# Patient Record
Sex: Male | Born: 1955
Health system: Southern US, Community
[De-identification: ages and names within clinical notes are randomized; demographics above are authoritative.]

## PROBLEM LIST (undated history)

## (undated) DIAGNOSIS — Z8669 Personal history of other diseases of the nervous system and sense organs: Secondary | ICD-10-CM

## (undated) DIAGNOSIS — K900 Celiac disease: Secondary | ICD-10-CM

## (undated) DIAGNOSIS — N182 Chronic kidney disease, stage 2 (mild): Secondary | ICD-10-CM

## (undated) DIAGNOSIS — F5104 Psychophysiologic insomnia: Secondary | ICD-10-CM

## (undated) DIAGNOSIS — I10 Essential (primary) hypertension: Secondary | ICD-10-CM

## (undated) DIAGNOSIS — N529 Male erectile dysfunction, unspecified: Secondary | ICD-10-CM

## (undated) DIAGNOSIS — I480 Paroxysmal atrial fibrillation: Secondary | ICD-10-CM

## (undated) DIAGNOSIS — K219 Gastro-esophageal reflux disease without esophagitis: Secondary | ICD-10-CM

## (undated) DIAGNOSIS — G4733 Obstructive sleep apnea (adult) (pediatric): Secondary | ICD-10-CM

## (undated) DIAGNOSIS — I4892 Unspecified atrial flutter: Secondary | ICD-10-CM

## (undated) DIAGNOSIS — J310 Chronic rhinitis: Secondary | ICD-10-CM

## (undated) DIAGNOSIS — M1990 Unspecified osteoarthritis, unspecified site: Secondary | ICD-10-CM

## (undated) DIAGNOSIS — T7840XA Allergy, unspecified, initial encounter: Secondary | ICD-10-CM

## (undated) DIAGNOSIS — J329 Chronic sinusitis, unspecified: Secondary | ICD-10-CM

## (undated) DIAGNOSIS — E7800 Pure hypercholesterolemia, unspecified: Secondary | ICD-10-CM

## (undated) DIAGNOSIS — E119 Type 2 diabetes mellitus without complications: Secondary | ICD-10-CM

## (undated) DIAGNOSIS — I2510 Atherosclerotic heart disease of native coronary artery without angina pectoris: Secondary | ICD-10-CM

## (undated) HISTORY — PX: CORONARY STENT PLACEMENT: SHX1402

## (undated) HISTORY — DX: Paroxysmal atrial fibrillation: I48.0

## (undated) HISTORY — DX: Male erectile dysfunction, unspecified: N52.9

## (undated) HISTORY — DX: Chronic sinusitis, unspecified: J32.9

## (undated) HISTORY — DX: Atherosclerotic heart disease of native coronary artery without angina pectoris: I25.10

## (undated) HISTORY — DX: Unspecified osteoarthritis, unspecified site: M19.90

## (undated) HISTORY — DX: Type 2 diabetes mellitus without complications: E11.9

## (undated) HISTORY — DX: Psychophysiologic insomnia: F51.04

## (undated) HISTORY — DX: Celiac disease: K90.0

## (undated) HISTORY — PX: SPINE SURGERY: SHX786

## (undated) HISTORY — DX: Pure hypercholesterolemia, unspecified: E78.00

## (undated) HISTORY — DX: Gastro-esophageal reflux disease without esophagitis: K21.9

## (undated) HISTORY — DX: Chronic kidney disease, stage 2 (mild): N18.2

## (undated) HISTORY — DX: Unspecified atrial flutter: I48.92

## (undated) HISTORY — PX: NASAL SINUS SURGERY: SHX719

## (undated) HISTORY — DX: Essential (primary) hypertension: I10

## (undated) HISTORY — DX: Allergy, unspecified, initial encounter: T78.40XA

## (undated) HISTORY — DX: Obstructive sleep apnea (adult) (pediatric): G47.33

## (undated) HISTORY — PX: EYE SURGERY: SHX253

## (undated) HISTORY — DX: Chronic rhinitis: J31.0

## (undated) HISTORY — PX: OTHER SURGICAL HISTORY: SHX169

## (undated) HISTORY — DX: Personal history of other diseases of the nervous system and sense organs: Z86.69

## (undated) HISTORY — PX: SHOULDER ACROMIOPLASTY: SHX6093

## (undated) HISTORY — PX: REFRACTIVE SURGERY: SHX103

## (undated) HISTORY — PX: KNEE ARTHROPLASTY: SHX992

## (undated) HISTORY — PX: ELBOW ARTHROPLASTY: SHX928

## (undated) HISTORY — PX: ANKLE ARTHROPLASTY: SUR68

---

## 1997-06-06 HISTORY — PX: CARDIAC CATHETERIZATION: SHX172

## 1997-11-27 ENCOUNTER — Inpatient Hospital Stay (HOSPITAL_COMMUNITY): Admission: AD | Admit: 1997-11-27 | Discharge: 1997-11-28 | Payer: Self-pay | Admitting: Cardiovascular Disease

## 1998-01-05 ENCOUNTER — Emergency Department (HOSPITAL_COMMUNITY): Admission: EM | Admit: 1998-01-05 | Discharge: 1998-01-05 | Payer: Self-pay | Admitting: Emergency Medicine

## 1998-01-24 ENCOUNTER — Emergency Department (HOSPITAL_COMMUNITY): Admission: EM | Admit: 1998-01-24 | Discharge: 1998-01-24 | Payer: Self-pay | Admitting: Emergency Medicine

## 1999-04-08 ENCOUNTER — Emergency Department (HOSPITAL_COMMUNITY): Admission: EM | Admit: 1999-04-08 | Discharge: 1999-04-08 | Payer: Self-pay | Admitting: *Deleted

## 1999-05-09 ENCOUNTER — Encounter: Payer: Self-pay | Admitting: Cardiology

## 1999-05-09 ENCOUNTER — Inpatient Hospital Stay (HOSPITAL_COMMUNITY): Admission: EM | Admit: 1999-05-09 | Discharge: 1999-05-11 | Payer: Self-pay | Admitting: Emergency Medicine

## 2000-07-10 ENCOUNTER — Inpatient Hospital Stay (HOSPITAL_COMMUNITY): Admission: EM | Admit: 2000-07-10 | Discharge: 2000-07-13 | Payer: Self-pay | Admitting: Emergency Medicine

## 2000-07-10 ENCOUNTER — Encounter: Payer: Self-pay | Admitting: Emergency Medicine

## 2000-07-11 ENCOUNTER — Encounter: Payer: Self-pay | Admitting: Interventional Cardiology

## 2000-07-13 ENCOUNTER — Encounter: Payer: Self-pay | Admitting: Cardiology

## 2000-09-18 ENCOUNTER — Ambulatory Visit (HOSPITAL_COMMUNITY): Admission: RE | Admit: 2000-09-18 | Discharge: 2000-09-18 | Payer: Self-pay | Admitting: Orthopedic Surgery

## 2000-09-18 ENCOUNTER — Encounter: Payer: Self-pay | Admitting: Orthopedic Surgery

## 2001-10-05 ENCOUNTER — Encounter: Payer: Self-pay | Admitting: Gastroenterology

## 2001-10-05 ENCOUNTER — Encounter: Admission: RE | Admit: 2001-10-05 | Discharge: 2001-10-05 | Payer: Self-pay | Admitting: Gastroenterology

## 2002-04-08 ENCOUNTER — Encounter: Payer: Self-pay | Admitting: Emergency Medicine

## 2002-04-08 ENCOUNTER — Emergency Department (HOSPITAL_COMMUNITY): Admission: EM | Admit: 2002-04-08 | Discharge: 2002-04-08 | Payer: Self-pay | Admitting: Emergency Medicine

## 2002-07-28 ENCOUNTER — Encounter: Payer: Self-pay | Admitting: Specialist

## 2002-07-28 ENCOUNTER — Encounter: Admission: RE | Admit: 2002-07-28 | Discharge: 2002-07-28 | Payer: Self-pay | Admitting: Specialist

## 2002-09-07 ENCOUNTER — Ambulatory Visit (HOSPITAL_BASED_OUTPATIENT_CLINIC_OR_DEPARTMENT_OTHER): Admission: RE | Admit: 2002-09-07 | Discharge: 2002-09-07 | Payer: Self-pay | Admitting: *Deleted

## 2002-09-07 ENCOUNTER — Encounter: Payer: Self-pay | Admitting: Pulmonary Disease

## 2002-09-21 ENCOUNTER — Encounter: Payer: Self-pay | Admitting: Specialist

## 2002-09-21 ENCOUNTER — Ambulatory Visit (HOSPITAL_COMMUNITY): Admission: RE | Admit: 2002-09-21 | Discharge: 2002-09-21 | Payer: Self-pay | Admitting: Specialist

## 2002-10-26 ENCOUNTER — Encounter: Payer: Self-pay | Admitting: Specialist

## 2002-11-02 ENCOUNTER — Encounter: Payer: Self-pay | Admitting: Specialist

## 2002-11-02 ENCOUNTER — Observation Stay (HOSPITAL_COMMUNITY): Admission: RE | Admit: 2002-11-02 | Discharge: 2002-11-03 | Payer: Self-pay | Admitting: Specialist

## 2003-06-20 ENCOUNTER — Encounter: Admission: RE | Admit: 2003-06-20 | Discharge: 2003-06-20 | Payer: Self-pay | Admitting: Allergy and Immunology

## 2003-08-29 ENCOUNTER — Observation Stay (HOSPITAL_COMMUNITY): Admission: EM | Admit: 2003-08-29 | Discharge: 2003-08-30 | Payer: Self-pay | Admitting: Specialist

## 2004-01-27 ENCOUNTER — Encounter: Admission: RE | Admit: 2004-01-27 | Discharge: 2004-01-27 | Payer: Self-pay | Admitting: Specialist

## 2004-01-30 ENCOUNTER — Observation Stay (HOSPITAL_COMMUNITY): Admission: RE | Admit: 2004-01-30 | Discharge: 2004-01-31 | Payer: Self-pay | Admitting: Specialist

## 2004-09-06 ENCOUNTER — Encounter (INDEPENDENT_AMBULATORY_CARE_PROVIDER_SITE_OTHER): Payer: Self-pay | Admitting: *Deleted

## 2004-09-06 ENCOUNTER — Ambulatory Visit (HOSPITAL_COMMUNITY): Admission: RE | Admit: 2004-09-06 | Discharge: 2004-09-06 | Payer: Self-pay | Admitting: Gastroenterology

## 2004-12-09 ENCOUNTER — Ambulatory Visit: Payer: Self-pay | Admitting: Pulmonary Disease

## 2005-05-13 ENCOUNTER — Ambulatory Visit (HOSPITAL_BASED_OUTPATIENT_CLINIC_OR_DEPARTMENT_OTHER): Admission: RE | Admit: 2005-05-13 | Discharge: 2005-05-13 | Payer: Self-pay | Admitting: Orthopedic Surgery

## 2005-05-13 ENCOUNTER — Ambulatory Visit (HOSPITAL_COMMUNITY): Admission: RE | Admit: 2005-05-13 | Discharge: 2005-05-13 | Payer: Self-pay | Admitting: Orthopedic Surgery

## 2005-06-27 ENCOUNTER — Ambulatory Visit: Payer: Self-pay | Admitting: Pulmonary Disease

## 2005-09-20 ENCOUNTER — Ambulatory Visit (HOSPITAL_COMMUNITY): Admission: RE | Admit: 2005-09-20 | Discharge: 2005-09-20 | Payer: Self-pay | Admitting: Family Medicine

## 2006-04-06 ENCOUNTER — Ambulatory Visit (HOSPITAL_BASED_OUTPATIENT_CLINIC_OR_DEPARTMENT_OTHER): Admission: RE | Admit: 2006-04-06 | Discharge: 2006-04-06 | Payer: Self-pay | Admitting: Orthopaedic Surgery

## 2006-06-06 ENCOUNTER — Emergency Department (HOSPITAL_COMMUNITY): Admission: EM | Admit: 2006-06-06 | Discharge: 2006-06-07 | Payer: Self-pay | Admitting: Emergency Medicine

## 2006-09-18 ENCOUNTER — Ambulatory Visit: Payer: Self-pay | Admitting: Pulmonary Disease

## 2006-11-03 ENCOUNTER — Ambulatory Visit (HOSPITAL_BASED_OUTPATIENT_CLINIC_OR_DEPARTMENT_OTHER): Admission: RE | Admit: 2006-11-03 | Discharge: 2006-11-03 | Payer: Self-pay | Admitting: Orthopaedic Surgery

## 2007-03-16 ENCOUNTER — Encounter: Admission: RE | Admit: 2007-03-16 | Discharge: 2007-03-16 | Payer: Self-pay | Admitting: Orthopaedic Surgery

## 2007-08-02 DIAGNOSIS — I2510 Atherosclerotic heart disease of native coronary artery without angina pectoris: Secondary | ICD-10-CM

## 2007-08-02 DIAGNOSIS — G4733 Obstructive sleep apnea (adult) (pediatric): Secondary | ICD-10-CM | POA: Insufficient documentation

## 2007-08-02 DIAGNOSIS — E7800 Pure hypercholesterolemia, unspecified: Secondary | ICD-10-CM | POA: Insufficient documentation

## 2007-08-02 DIAGNOSIS — J310 Chronic rhinitis: Secondary | ICD-10-CM | POA: Insufficient documentation

## 2007-08-02 DIAGNOSIS — I10 Essential (primary) hypertension: Secondary | ICD-10-CM

## 2007-08-02 HISTORY — DX: Essential (primary) hypertension: I10

## 2007-08-02 HISTORY — DX: Atherosclerotic heart disease of native coronary artery without angina pectoris: I25.10

## 2007-08-02 HISTORY — DX: Pure hypercholesterolemia, unspecified: E78.00

## 2007-08-02 HISTORY — DX: Chronic rhinitis: J31.0

## 2007-08-03 ENCOUNTER — Ambulatory Visit: Payer: Self-pay | Admitting: Pulmonary Disease

## 2007-08-27 ENCOUNTER — Encounter: Payer: Self-pay | Admitting: Pulmonary Disease

## 2007-09-09 ENCOUNTER — Ambulatory Visit: Payer: Self-pay | Admitting: Pulmonary Disease

## 2007-09-24 ENCOUNTER — Emergency Department (HOSPITAL_COMMUNITY): Admission: EM | Admit: 2007-09-24 | Discharge: 2007-09-24 | Payer: Self-pay | Admitting: Emergency Medicine

## 2007-12-07 HISTORY — PX: CARDIOVASCULAR STRESS TEST: SHX262

## 2008-09-20 ENCOUNTER — Encounter: Admission: RE | Admit: 2008-09-20 | Discharge: 2008-09-20 | Payer: Self-pay | Admitting: Gastroenterology

## 2008-10-23 ENCOUNTER — Ambulatory Visit: Payer: Self-pay | Admitting: Pulmonary Disease

## 2008-12-18 ENCOUNTER — Encounter: Admission: RE | Admit: 2008-12-18 | Discharge: 2008-12-18 | Payer: Self-pay | Admitting: Orthopedic Surgery

## 2009-07-17 ENCOUNTER — Emergency Department (HOSPITAL_COMMUNITY): Admission: EM | Admit: 2009-07-17 | Discharge: 2009-07-18 | Payer: Self-pay | Admitting: Emergency Medicine

## 2009-10-02 ENCOUNTER — Ambulatory Visit (HOSPITAL_COMMUNITY): Admission: RE | Admit: 2009-10-02 | Discharge: 2009-10-03 | Payer: Self-pay | Admitting: Orthopedic Surgery

## 2010-05-15 ENCOUNTER — Ambulatory Visit: Payer: Self-pay | Admitting: Cardiology

## 2010-07-11 NOTE — Assessment & Plan Note (Signed)
Summary: rov for osa   CC:  Pt here for follow-up sleep visit. Pt is using CPAP about 6 hours a night. .  History of Present Illness: The pt comes in today for f/u of his osa.  He did not make the switch over to bipap due to expense, and is back on his cpap at unknown pressure.  He has been intermittantly compliant with cpap, and currently is wearing about 5-6hrs a day.  He is having no issues with mask fit or pressure.  He has not worn consistently enought to see if this will make a difference to QOL.  Current Medications (verified): 1)  Multivitamins   Tabs (Multiple Vitamin) .... Take 1 Tablet By Mouth Once A Day 2)  Lopressor 50 Mg  Tabs (Metoprolol Tartrate) .... Take 1 Tablet By Mouth Two Times A Day 3)  Lipitor 80 Mg  Tabs (Atorvastatin Calcium) .... Take 1 Tablet By Mouth Once A Day 4)  Imdur 30 Mg  Tb24 (Isosorbide Mononitrate) .... Take 1 Tablet By Mouth Once A Day 5)  Bayer Aspirin 325 Mg  Tabs (Aspirin) .... Take 1 Tablet By Mouth Once A Day 6)  Glucophage 500 Mg  Tabs (Metformin Hcl) .... Take 1 Tablet By Mouth Two Times A Day 7)  Topamax 300 Mg  Tabs (Topiramate) .... At Bedtime 8)  Benicar .... Take 1 Tablet By Mouth Once A Day 9)  Librax 2.5-5 Mg  Caps (Clidinium-Chlordiazepoxide) .... Take By Mouth As Needed 10)  Oxycodone .... Take By Mouth As Needed 11)  Nitroquick 0.4 Mg  Subl (Nitroglycerin) .... Take As Needed 12)  Methocarbamol .... Take By Mouth As Needed 13)  Remeron 15 Mg  Tabs (Mirtazapine) .... 2-3 Tabs Per Night As Needed 14)  Omeprazole 40 Mg  Cpdr (Omeprazole) .... Take 1 Tablet By Mouth Once A Day  Allergies (verified): No Known Drug Allergies  Review of Systems      See HPI  Vital Signs:  Patient profile:   55 year old male Height:      75 inches Weight:      278.38 pounds BMI:     34.92 O2 Sat:      94 % Temp:     97.5 degrees F oral Pulse rate:   62 / minute BP sitting:   124 / 82  (left arm) Cuff size:   regular  Vitals Entered By:  Carron Curie CMA (Oct 23, 2008 9:43 AM)  O2 Sat at Rest %:  94 O2 Flow:  room air CC: Pt here for follow-up sleep visit. Pt is using CPAP about 6 hours a night.  Comments Medications reviewed with patient Carron Curie CMA  Oct 23, 2008 9:45 AM    Physical Exam  General:  obese male in nad Nose:  no skin breakdown or pressure necrosis from cpap mask.   Impression & Recommendations:  Problem # 1:  OBSTRUCTIVE SLEEP APNEA (ICD-327.23) The pt is only intermittantly compliant with cpap.  He denies any specific issues with the device, and it appears his compliance issue is just a function of his motivation.  We have done everything to make this a successful experience, and I have explained to the patient the more he wears the device, the better he will feel.  We will need to make sure his cpap machine is set on 10cm, since he felt he could not afford the addtional copay for bipap.  I have also encouraged him to work hard on weight loss.  Medications Added to Medication List This Visit: 1)  Topamax 300 Mg Tabs (topiramate)  .... At bedtime  Other Orders: Est. Patient Level III (04540) DME Referral (DME)  Patient Instructions: 1)  work on weight loss 2)  f/u with me in 12mos

## 2010-07-11 NOTE — Miscellaneous (Signed)
Summary: bipap auto optimized to 10/7  Clinical Lists Changes  Orders: Added new Referral order of Misc. Referral (Misc. Ref) - Signed auto bipap study shows 70% compliance, but mostly less than 4 hrs.  Optimal pressure is 10/7 with no significant leaking.  will get dme to place him on bipap at this setting if he wishes to stay on positive pressure therapy.

## 2010-07-11 NOTE — Assessment & Plan Note (Signed)
Summary: f/u osa   Chief Complaint:  pt says has pneumonia.......coughing when laying down....Marland Kitchenreviewed meds.  History of Present Illness: Pt comes in today for f/u of his osa.  He has been having pulmonary symptoms, and is being treated by primary care with avelox.  He has not worn cpap in 2mos.  Gets to sleep easily, but pulls off often in the middle of the night.  Thinks it may be the pressure bothering him.  Still willing to work with cpap device. Has not lost weight.        Review of Systems      See HPI   Vital Signs:  Patient Profile:   55 Years Old Male Height:     75 inches Weight:      286.4 pounds BMI:     35.93 O2 Sat:      98 % O2 treatment:    Room Air Temp:     97.5 degrees F oral Pulse rate:   92 / minute BP sitting:   120 / 90  (left arm) Cuff size:   regular  Pt. in pain?   no  Vitals Entered By: Clarise Cruz Duncan Dull) (August 03, 2007 8:56 AM)                  Physical Exam  General:     obese male in nad Nose:     no skin breakdown or pressure necrosis from cpap mask     Impression & Recommendations:  Problem # 1:  OBSTRUCTIVE SLEEP APNEA (ICD-327.23) Pt having difficulty with cpap tolerance.  Unclear if mask or pressure issue, or whether this is simply acceptance/choice.  He is at least willing to try different things.  Will give him 2-3 weeks on auto-bipap to see if this improves tolerance, and will look at download.  I will call the pt and discuss once report received.  I have also encouraged him to work on wt. loss.   Medications Added to Medication List This Visit: 1)  Remeron 15 Mg Tabs (Mirtazapine) .... 2-3 tabs per night as needed   Patient Instructions: 1)  will try auto-bipap for 2-3 weeks.  I will call you with results 2)  work on weight loss 3)  Please schedule a follow-up appointment in 1 year.    ]

## 2010-07-11 NOTE — Assessment & Plan Note (Signed)
Summary: no visit.   Chief Complaint:  follow-up on sleep.  History of Present Illness: pt never received his bipap machine, and therefore this visit will be cancelled.         Vital Signs:  Patient Profile:   55 Years Old Male Height:     75 inches Weight:      290 pounds O2 Sat:      93 % O2 treatment:    Room Air Temp:     98.5 degrees F oral Pulse rate:   76 / minute BP sitting:   132 / 88  (left arm)  Vitals Entered By: Cloyde Reams RN (September 09, 2007 9:26 AM)             Comments Pt is here today for a follow-up visit on CPAP machine, ? supposed to be getting a BiPAP machine. Pt unable to wear CPAP for very long d/t too much air in mask, pressure too high? Medications reviewed ..................................................................Marland KitchenCloyde Reams RN  September 09, 2007 9:31 AM         Medications Added to Medication List This Visit: 1)  Omeprazole 40 Mg Cpdr (Omeprazole) .... Take 1 tablet by mouth once a day   Patient Instructions: 1)  will get you on bipap 2)  call in 2-3 weeks with progress. 3)  f/u 6mos if doing well. 4)  work on weight loss 5)  will credit co-pay to next visit.    ]

## 2010-07-11 NOTE — Letter (Signed)
Summary: Approval for CPAP Equipment/[employer]  Approval for CPAP Equipment/[employer]   Imported By: Esmeralda Links D'jimraou 10/15/2007 11:46:47  _____________________________________________________________________  External Attachment:    Type:   Image     Comment:   External Document

## 2010-08-27 LAB — CBC
HCT: 40.3 % (ref 39.0–52.0)
Hemoglobin: 13.7 g/dL (ref 13.0–17.0)
MCHC: 34 g/dL (ref 30.0–36.0)
MCV: 89 fL (ref 78.0–100.0)
Platelets: 207 10*3/uL (ref 150–400)
RBC: 4.53 MIL/uL (ref 4.22–5.81)
RDW: 13.2 % (ref 11.5–15.5)
WBC: 5.1 10*3/uL (ref 4.0–10.5)

## 2010-08-27 LAB — COMPREHENSIVE METABOLIC PANEL
ALT: 46 U/L (ref 0–53)
AST: 50 U/L — ABNORMAL HIGH (ref 0–37)
Albumin: 4 g/dL (ref 3.5–5.2)
Alkaline Phosphatase: 70 U/L (ref 39–117)
BUN: 9 mg/dL (ref 6–23)
CO2: 31 mEq/L (ref 19–32)
Calcium: 9.5 mg/dL (ref 8.4–10.5)
Chloride: 100 mEq/L (ref 96–112)
Creatinine, Ser: 1 mg/dL (ref 0.4–1.5)
GFR calc Af Amer: >60 mL/min (ref >60)
GFR calc non Af Amer: >60 mL/min (ref >60)
Glucose, Bld: 296 mg/dL — ABNORMAL HIGH (ref 70–99)
Potassium: 4.1 mEq/L (ref 3.5–5.1)
Sodium: 138 mEq/L (ref 135–145)
Total Bilirubin: 0.9 mg/dL (ref 0.3–1.2)
Total Protein: 6.9 g/dL (ref 6.0–8.3)

## 2010-08-27 LAB — DIFFERENTIAL
Basophils Absolute: 0 10*3/uL (ref 0.0–0.1)
Basophils Relative: 1 % (ref 0–1)
Eosinophils Absolute: 0.2 10*3/uL (ref 0.0–0.7)
Eosinophils Relative: 5 % (ref 0–5)
Lymphocytes Relative: 38 % (ref 12–46)
Lymphs Abs: 1.9 10*3/uL (ref 0.7–4.0)
Monocytes Absolute: 0.4 10*3/uL (ref 0.1–1.0)
Monocytes Relative: 8 % (ref 3–12)
Neutro Abs: 2.5 10*3/uL (ref 1.7–7.7)
Neutrophils Relative %: 49 % (ref 43–77)

## 2010-08-27 LAB — GLUCOSE, CAPILLARY
Glucose-Capillary: 144 mg/dL — ABNORMAL HIGH (ref 70–99)
Glucose-Capillary: 173 mg/dL — ABNORMAL HIGH (ref 70–99)
Glucose-Capillary: 189 mg/dL — ABNORMAL HIGH (ref 70–99)
Glucose-Capillary: 217 mg/dL — ABNORMAL HIGH (ref 70–99)
Glucose-Capillary: 220 mg/dL — ABNORMAL HIGH (ref 70–99)
Glucose-Capillary: 220 mg/dL — ABNORMAL HIGH (ref 70–99)
Glucose-Capillary: 231 mg/dL — ABNORMAL HIGH (ref 70–99)

## 2010-08-27 LAB — PROTIME-INR
INR: 1.14 (ref 0.00–1.49)
Prothrombin Time: 14.5 seconds (ref 11.6–15.2)

## 2010-08-27 LAB — APTT: aPTT: 23 seconds — ABNORMAL LOW (ref 24–37)

## 2010-08-28 LAB — POCT CARDIAC MARKERS
CKMB, poc: <1 ng/mL — ABNORMAL LOW (ref 1.0–8.0)
CKMB, poc: <1 ng/mL — ABNORMAL LOW (ref 1.0–8.0)
Myoglobin, poc: 40 ng/mL (ref 12–200)
Myoglobin, poc: 40.3 ng/mL (ref 12–200)
Troponin i, poc: <0.05 ng/mL (ref 0.00–0.09)
Troponin i, poc: <0.05 ng/mL (ref 0.00–0.09)

## 2010-08-28 LAB — URINALYSIS, ROUTINE W REFLEX MICROSCOPIC
Bilirubin Urine: NEGATIVE
Glucose, UA: NEGATIVE mg/dL
Hgb urine dipstick: NEGATIVE
Ketones, ur: NEGATIVE mg/dL
Nitrite: NEGATIVE
Protein, ur: NEGATIVE mg/dL
Specific Gravity, Urine: 1.016 (ref 1.005–1.030)
Urobilinogen, UA: 0.2 mg/dL (ref 0.0–1.0)
pH: 5.5 (ref 5.0–8.0)

## 2010-08-28 LAB — COMPREHENSIVE METABOLIC PANEL
ALT: 36 U/L (ref 0–53)
AST: 32 U/L (ref 0–37)
Albumin: 3.6 g/dL (ref 3.5–5.2)
Alkaline Phosphatase: 59 U/L (ref 39–117)
BUN: 13 mg/dL (ref 6–23)
CO2: 25 mEq/L (ref 19–32)
Calcium: 8.4 mg/dL (ref 8.4–10.5)
Chloride: 108 mEq/L (ref 96–112)
Creatinine, Ser: 0.78 mg/dL (ref 0.4–1.5)
GFR calc Af Amer: >60 mL/min (ref >60)
GFR calc non Af Amer: >60 mL/min (ref >60)
Glucose, Bld: 159 mg/dL — ABNORMAL HIGH (ref 70–99)
Potassium: 3.9 mEq/L (ref 3.5–5.1)
Sodium: 140 mEq/L (ref 135–145)
Total Bilirubin: 0.6 mg/dL (ref 0.3–1.2)
Total Protein: 6.3 g/dL (ref 6.0–8.3)

## 2010-08-28 LAB — DIFFERENTIAL
Basophils Absolute: 0 10*3/uL (ref 0.0–0.1)
Basophils Relative: 1 % (ref 0–1)
Eosinophils Absolute: 0 10*3/uL (ref 0.0–0.7)
Eosinophils Relative: 0 % (ref 0–5)
Lymphocytes Relative: 12 % (ref 12–46)
Lymphs Abs: 0.9 10*3/uL (ref 0.7–4.0)
Monocytes Absolute: 0.2 10*3/uL (ref 0.1–1.0)
Monocytes Relative: 3 % (ref 3–12)
Neutro Abs: 6.4 10*3/uL (ref 1.7–7.7)
Neutrophils Relative %: 84 % — ABNORMAL HIGH (ref 43–77)

## 2010-08-28 LAB — CBC
HCT: 38.8 % — ABNORMAL LOW (ref 39.0–52.0)
Hemoglobin: 13.1 g/dL (ref 13.0–17.0)
MCHC: 33.8 g/dL (ref 30.0–36.0)
MCV: 90.3 fL (ref 78.0–100.0)
Platelets: 196 10*3/uL (ref 150–400)
RBC: 4.3 MIL/uL (ref 4.22–5.81)
RDW: 13.4 % (ref 11.5–15.5)
WBC: 7.6 10*3/uL (ref 4.0–10.5)

## 2010-08-28 LAB — LIPASE, BLOOD: Lipase: 26 U/L (ref 11–59)

## 2010-09-10 ENCOUNTER — Ambulatory Visit: Payer: Self-pay | Admitting: Pulmonary Disease

## 2010-09-23 ENCOUNTER — Encounter: Payer: Self-pay | Admitting: Pulmonary Disease

## 2010-09-24 ENCOUNTER — Ambulatory Visit (INDEPENDENT_AMBULATORY_CARE_PROVIDER_SITE_OTHER): Payer: 59 | Admitting: Pulmonary Disease

## 2010-09-24 ENCOUNTER — Encounter: Payer: Self-pay | Admitting: Pulmonary Disease

## 2010-09-24 VITALS — BP 128/80 | HR 62 | Temp 98.2°F | Ht 75.0 in | Wt 259.8 lb

## 2010-09-24 DIAGNOSIS — G4733 Obstructive sleep apnea (adult) (pediatric): Secondary | ICD-10-CM

## 2010-09-24 NOTE — Patient Instructions (Signed)
Stay on cpap, work on weight loss Get new mask and supplies followup with me in one year

## 2010-09-24 NOTE — Progress Notes (Signed)
  Subjective:    Patient ID: Michael Hartman, male    DOB: August 14, 1955, 55 y.o.   MRN: [id number]  HPI  The pt comes in today for f/u of his known osa.  He is wearing cpap compliantly, and has lost 20 pounds since his last visit.  He is having no mask fit issues, but is overdue for a new mask.  He has no issues with pressure tolerance.  He feels he sleeps well, and has no daytime sleepiness issues.       Review of Systems  Constitutional: Negative for fever and unexpected weight change.  HENT: Positive for ear pain, congestion, sneezing, dental problem, postnasal drip and sinus pressure. Negative for nosebleeds, sore throat, rhinorrhea and trouble swallowing.   Eyes: Positive for redness and itching.  Respiratory: Negative for cough, chest tightness, shortness of breath and wheezing.   Cardiovascular: Negative for palpitations and leg swelling.  Gastrointestinal: Positive for nausea. Negative for vomiting.  Genitourinary: Negative for dysuria.  Musculoskeletal: Negative for joint swelling.  Skin: Negative for rash.  Neurological: Negative for headaches.  Hematological: Does not bruise/bleed easily.  Psychiatric/Behavioral: Negative for dysphoric mood. The patient is not nervous/anxious.        Objective:   Physical Exam obese male in nad   No skin breakdown or pressure necrosis from cpap mask  Chest clear No edema or cyanosis  Alert and oriented, moves all 4    Assessment & Plan:

## 2010-09-27 ENCOUNTER — Encounter: Payer: Self-pay | Admitting: Pulmonary Disease

## 2010-09-27 NOTE — Assessment & Plan Note (Signed)
The pt is doing well with cpap, and has lost weight since his last visit.  He will need a new mask and supplies, and I have asked him to try and keep up with this more regularly.  He is to continue working on weight loss.

## 2010-10-04 ENCOUNTER — Other Ambulatory Visit: Payer: Self-pay | Admitting: Cardiology

## 2010-10-04 DIAGNOSIS — I10 Essential (primary) hypertension: Secondary | ICD-10-CM

## 2010-10-04 MED ORDER — OLMESARTAN MEDOXOMIL 40 MG PO TABS: 40.0000 mg | ORAL_TABLET | Freq: Every day | ORAL | Status: DC

## 2010-10-04 NOTE — Telephone Encounter (Signed)
escribe medication per fax request  

## 2010-10-25 NOTE — Op Note (Signed)
NAME:  Michael Hartman, Michael Hartman                          ACCOUNT NO.:  [id number]   MEDICAL RECORD NO.:  [id number]                   PATIENT TYPE:  INP   LOCATION:  0472                                 FACILITY:  [facility]   PHYSICIAN:  Kerrin Champagne, M.D.                DATE OF BIRTH:  1955-10-22   DATE OF PROCEDURE:  DATE OF DISCHARGE:                                 OPERATIVE REPORT   PREOPERATIVE DIAGNOSIS:  Right L4-5 lateral recess stenosis with foraminal  entrapment involving the right L4 nerve root.   POSTOPERATIVE DIAGNOSIS:  Right L4-5 lateral recess stenosis with foraminal  entrapment involving the right L4 nerve root.   OPERATION/PROCEDURE:  Right-sided L4-5 lateral recess decompression with  right L4-5 foraminotomy.   SURGEON:  Kerrin Champagne, M.D.   ASSISTANT:  Wende Neighbors, P.A.-C.   ANESTHESIA:  General orotracheal anesthesia.   ESTIMATED BLOOD LOSS:  60 mL.   DRAINS:  None.   BRIEF CLINICAL HISTORY:  The patient is a 55 year old male who had not  experienced a significant back pain until a fall he sustained while working  on his attic on or about January of this year.  The patient fell through his  roof sustaining a back injury.  I had seen him initially for problems  relative to neck pain and radiation into his arms with spondylosis findings.  The patient was found to have severe arthrosis changes involving the C3-4  facet.  Eventually underwent facet injection and selective nerve root block  which relieved his pain.  The patient, however, persisted with back pain  radiation of the right leg.  He underwent evaluation of this and was found  to have a foraminal narrowing of the right side at L4-5 with compression of  the right exiting L4 nerve root and possible focal disk protrusion of this  side.  Spondylosis changes also at the L5-S1 level with minimal foraminal  narrowing at the L5-S1 level on the right.  The patient has some weakness on  right foot  dorsiflexion in general.  None on the left.  After attempts at  conservative management, he was taken to the operating room today to undergo  a right-sided lateral recess decompression at L4-5 with L4 nerve root  foraminotomy.   INTRAOPERATIVE FINDINGS:  He was found to have hypertrophic ligamentum  flavum entering into the right L4 neural foramen affecting the right L4  affecting the L4 nerve root.  Indeed there is a rather large frond of  ligamentum flavum tissue that was pressing against the posterior aspect of  the L4 nerve root as it exited.  Following foraminotomy, the L4 nerve root  was probed posterior and anterior to the nerve root without any signs of  further compression.  The disk on the right side shows a very mild bulging  evident but no focal protrusion, no evidence of subligamentous disk  herniation  or otherwise that would warrant disk excision.   DESCRIPTION OF PROCEDURE:  After adequate general anesthesia, the patient  was placed into the prone position, knee-chest.  Andrews frame was used.  Standard preoperative antibiotics of vancomycin, draped in the usual manner  following prep with DuraPrep.  Two spinal needles placed at the expected L4-  5 level and intraoperative lateral radiograph demonstrated needles just  above the disk space at the L4-5 level posteriorly.  An incision was made  following application of the iodine Vi-Drape marking the skin appropriately.  Then infiltration of the skin with 0.5% Marcaine with 1:200,000 epinephrine.  Incision approximately 1-1/2 inches in length through the skin and  subcutaneous layers, directly through the fatty layers down to the  lumbodorsal fascia.  Bleeders controlled using electrocautery.  Lumbodorsal  fascia then incised on the right side.  Spinous process of L4 to L5.  Cobbs  used to elevate the paralumbar muscles over the posterior aspect of the  lamina of L4 and over the interlaminar space posteriorly on the right  side  of L4-5.  A Kocher clamp placed on the spinous process of L4 and  intraoperative radiograph demonstrated this to be an L4 spinous process.  This remained our point of reference throughout the case.  A __________  retractor was then inserted and bleeders controlled using bipolar monopolar  electrocautery.  Leksell rongeur was used to remove the soft tissue over the  interlaminar region posteriorly at the L4-5 level.  There is significant  shingling at this level such that the space could not be entered through the  typical interlaminar region.  The high-speed bur was necessary to perform a  nearly complete hemilaminectomy on the right side, thinning the posterior  elements on the right side, resecting about 20-25% of the medial aspect of  the facet at the L4-5 level on the right side.  High-speed bur is used to  bend the inferior articular process of L4 medially over its one-fourth.  Continuing into the superficial portion of the lamina superiorly.  Kerrisons, 2 and 3 mm, were then used to remove deeper portions of the  lamina and bone that had been thinned previously.  Then partial medial  facetectomy performed over about 25% of the joint.  The ligamentum flavum  then resected and foraminotomy performed over the L5 nerve root.  Hockey  stick neural foramen could be passed out the L5 neural foramen without  difficulty.  Underneath the right L4-5 facet, however, there was a great  deal of entrapment present due to the reflected portion of the ligamentum  flavum as the L4 nerve root exited so that a small portion of the pars  region was removed medially and preserving at least 4-5 mm with the pars to  prevent spondylosis from recurring.  The reflective portion of ligamentum  flavum was then resected medially and over the superior aspect of the  superior articular process of L5 allowing the L4 nerve root then to egress from the spinal canal without evidence of entrapment.  A large portion  of  reflective flavum was to be present as well as a portion that had fronds  attached to it.  It may have represented partial synovial cyst extending  into the foramen.  Following decompression of the L4 nerve root, it was  noted to be actually without evidence of compression and a hockey stick  neuroprobe could be passed both posterior and anterior to the L4 nerve root  without evidence of entrapment.  The lateral aspect of the thecal sac was  then retracted medially using D'Errico and Penfield 4 then used to carefully  probe the epidural fat and tissues.  Bipolar electrocautery was used to  coagulate small epidural veins to allow for exposure of the disk on the  right side, right lateral aspect posteriorly of the disk at L4-5.  The disk  was found to be bulging posteriorly without focal herniation, without  evidence of subcutaneous or subligamentous protrusion.  No disk material  noted within the canal.  It was felt that with this patient's size that the  disk should be preserved and this would also tend to maintain the height of  the foramen, preventing it from collapsing further, recausing entrapment.  Irrigation was then performed.  Thrombin-soaked Gelfoam placed and  eventually removed.  Bone wax applied to the bleeding cancellous bone  surfaces.  Excess bone wax removed.  All Gelfoam was then removed from the  laminotomy site.  There was no active bleeding evident.  Soft tissue  retractors were then removed.  Small bleeders within the soft tissue over  the muscle, paralumbar muscles were cauterized.  There was no active  bleeding present at the end of the case.  Following irrigation then the  dorsal fascia was approximated in the midline with interrupted 0 Vicryl  sutures with UR-6 needle.  The deep subcutaneous layers were reapproximated  with the same suture, more superficial layers with interrupted 2-0 Vicryl  sutures and the skin closed with a running subcu stitch with 4-0  Vicryl.  Tincture of Benzoin and Steri-Strips applied.  The patient had Coverlet  dressing applied.  He was then returned to a supine position, reactivated  and extubated and returned to the recovery room in satisfactory condition.  All instrument and sponge counts were correct.                                                Kerrin Champagne, M.D.    Myra Rude  D:  11/02/2002  T:  11/02/2002  Job:  [id number]

## 2010-10-25 NOTE — Op Note (Signed)
NAME:  Michael Hartman, Michael Hartman                          ACCOUNT NO.:  [id number]   MEDICAL RECORD NO.:  [id number]                   PATIENT TYPE:  OBV   LOCATION:  0471                                 FACILITY:  [facility]   PHYSICIAN:  Kerrin Champagne, M.D.                DATE OF BIRTH:  [birth year]   DATE OF PROCEDURE:  01/30/2004  DATE OF DISCHARGE:                                 OPERATIVE REPORT   PREOPERATIVE DIAGNOSES:  1. Internal derangement, left knee.  2. Torn lateral meniscus by MRI.   POSTOPERATIVE DIAGNOSES:  Torn lateral meniscus horizontal cleave involving  the inferior surface of the lateral meniscus in the mid posterior rim  extending towards the lateral rim.   PROCEDURE:  Left knee arthroscopy with partial lateral meniscectomy.   SURGEON:  Dr. Vira Browns   ASSISTANT:  Wende Neighbors, PA-C   ANESTHESIA:  GOT, Dr. Vaughan Basta BLOOD LOSS:  5 mL.   DRAINS:  None.   TOTAL TOURNIQUET TIME:  At 300 mmHg 1 hour 15 minutes.   BRIEF CLINICAL HISTORY:  The patient, a 55 year old male, reportedly fell  less than 6 weeks following previous left knee arthroscopy for torn medial  meniscus and has complained of persistent left knee and back pain since the  time of a fall at a local car dealership.  He has undergone extensive  evaluation, physical therapy with attempts at relieving his discomfort.  Initial evaluation felt to represent primarily a knee sprain.  With therapy,  he has been recalcitrant to both physical therapy, anti-inflammatory agents  as well as steroid injection.  Underwent MRI study which demonstrated a tear  of the lateral meniscus which was horizontal cleave-type pattern that  communicated with the inferior surface of the lateral meniscus.  This in the  region of the posterior rim, extending towards the lateral rim of the  meniscus.   INTRAOPERATIVE FINDINGS:  As above.   DESCRIPTION OF PROCEDURE:  After adequate general anesthesia, the left lower  extremity prepped ankle to upper thigh, tourniquet about the upper thigh,  lateral knee post.  Standard with DuraPrep solution, draped in the usual  manner.   The areas of previous inferolateral, inferomedial portals were infiltrated  with Marcaine 0.5% with 1:200,000 epinephrine.  The knee inflated with 60 mL  of irrigant solution.  Stab incision then made into the lateral compartment  through the old previous stab incision, over the anterior-inferior lateral  portal.  The patient did have the tourniquet elevated after initial  injections of local anesthetic demonstrated some venous bleeding from the  skin.  The leg was elevated and Esmarch bandage used to exsanguinate the leg  and tourniquet inflated to 300 mmHg.  With the cannula placed for the  arthroscopic scope and evaluation of the suprapatellar pouch, the medial  recess demonstrated a slight shaggy appearance to the synovium over the  anteromedial aspect of  the knee joint but no erythematous changes, no sign  of reactive synovitis here.  Evaluation of the knee, medial joint line  demonstrated the old previous partial meniscectomy involving the posterior  aspect of the medial meniscus.  No abnormalities noted here.  The ACL  appeared to be in good condition.  Lateral joint line evaluated, and the  lateral joint line was easily entered and evaluated with the arthroscope.  The knee placed in a figure-4 position with internal rotation of the tibia.  The posterior horn and posterior rim of the lateral meniscus evaluated and  felt to have an undersurface tear that was horizontal cleave pattern.  The  probe easily placed within the substance of this tear in the lateral  meniscus.  Posterior rim was easily subluxed into the knee joint.  This area  was then carefully trimmed using baskets, forceps, and then a 4.2 full  radius shaver then used to shave the meniscus to a smooth consistency.  Following this, then probe again demonstrated that  the tear had been  debrided, and the meniscus no longer subluxed into the lateral joint line.  With this, the knee joint was irrigated, and evaluation of the lateral  recess and suprapatellar pouch demonstrated no significant abnormalities.  There was rather redundant area of fat along the anterior aspect of the knee  joint line in the region of the ligamentum mucosa, and there was a medial  shelf plica; however, did not appear to be erythematous nor a source of  pain.  The patient's pain is primarily lateral joint line.  Following  irrigation, then the irrigant solution was removed from the knee joint.  The  areas for placement of portals, both from the inferolateral and inferomedial  aspect of the knee joint were again infiltrated with Marcaine 5% with  1:200,000 epinephrine and 15 mL of Marcaine instilled into the knee joint.  And each of the portals, both inferolateral were then closed with single 3-0  Vicryl stitch.  Tincture of Benzoin and Steri-Strips applied.  The patient  then had Adaptic, 4 x 4's, ABD pad affixed to the skin with a Kerlix and Ace  wrap applied from the left foot to the upper thigh.  Tourniquet was  released.  Total tourniquet time 1 hour and 15 minutes.  This patient was  then reactivated, extubated, and returned to the recovery room in  satisfactory condition.  All instrument and sponge counts were correct.   POSTOPERATIVE CARE:  The patient has a history of sleep apnea, and therefore  will be admitted for observation overnight.  Be discharged in 24 hours  postop.  Discharge medications will include Percocet 5/325, #40, 1-2 q.4-6h.  p.r.n. pain, baby aspirin 1 tab p.o. daily.  He will be able to ambulate  using crutches, weightbearing as tolerated, be seen back in the office in 2  weeks.  At four days postop, he may remove his dressings, apply Band-Aids, 5  days postop may shower.                                               Kerrin Champagne, M.D.     Myra Rude  D:  01/30/2004  T:  01/30/2004  Job:  [id number]

## 2010-10-25 NOTE — Discharge Summary (Signed)
[place]. [facility]  Patient:    Michael Hartman, Michael Hartman                       MRN: [id number] Adm. Date:  [id number] Disc. Date: [id number] Attending:  [place], Peter Manning CC:         [name]. Eliezer Lofts., M.D.  Petra Kuba, M.D.   Discharge Summary  HISTORY OF PRESENT ILLNESS:  Mr. Mault is a 55 year old white male who has a known history of coronary artery disease.  He underwent stenting of the distal right coronary artery fairly extensively in 1998.  Subsequent cardiac catheterization in December 2000 showed occlusion of this area diffusely with collaterals from the left coronary artery.  He had no other significant disease and normal left ventricular function.  The patient has been maintained on medical therapy since that time.  His last stress test in September 2001 showed a fixed defect in the inferior base with no ischemia and good exercise level.  In addition, the patient has had various GI problems.  He has been diagnosed with celiac sprue by Dr. Laural Benes in the past.  He has been on a gluten-free diet.  He has also been evaluated by a gastroenterologist in [facility] who told him that he had irritable bowel syndrome.  On the day of admission, the patient complained of acute weakness, nausea, and midabdominal pain radiating through to his back.  He denied any diarrhea.  He had no vomiting.  Shortly after this, he developed some vague left precordial chest pain that he really could not characterize.  Because of this, he was admitted to rule out myocardial infarction.  The patients predominant symptoms are weakness and nausea and feeling beat up.  For details of his Past Medical History, Social History, and Family History, please see Admission History and Physical.  PHYSICAL EXAMINATION ON ADMISSION:  GENERAL APPEARANCE:  The patient is an obese white male in no apparent distress.  VITAL SIGNS:  Blood pressure was 110/70, pulse 80, respirations were  normal.  HEENT:  Unremarkable.  CHEST:  Lungs were clear.  CARDIOVASCULAR:  Cardiac exam was without gallops, murmurs, rubs, or clicks.  ABDOMEN:  Soft, nontender, without hepatosplenomegaly, masses, or bruits.  EXTREMITIES:  Without edema, pulses 2+ and symmetric.  He had no edema.  NEUROLOGIC:  Nonfocal.  LABORATORY AND X-RAY DATA:  ECG showed normal sinus rhythm with a normal ECG. Chest x-ray was normal.  The patient had an abdominal ultrasound which was normal.  CBC showed a white count of 5400 with hemoglobin 15.1, hematocrit 43.1, platelets 228,000.  CMET was normal.  Lipase was 31, amylase 52.  Troponin was less than 0.01 x 2, CPKs/MBs were negative x 2.  HOSPITAL COURSE:  The patient was admitted over the weekend.  He ruled out for myocardial infarction.  His symptoms improved, although he still felt weak. He was treated with subcu Lovenox and continued on his usual medications.  On July 13, 2000, he underwent a stress Cardiolite study.  He was able to walk for 9 minutes on the Bruce protocol.  He had maximal heart rate of 140.  He had very slight left precordial pain with this, but no ECG changes. Cardiolite study again showed mild inferior basilar defect without significant reversibility.  His ejection fraction was 59%.  This is felt to be unchanged from September 2001.  Altogether, it was felt that his pain was noncardiac.  He has been diagnosed with  both irritable bowel syndrome and celiac sprue in the past, and it is felt that his symptoms were predominantly GI related.  The patient wished to have repeat GI evaluation and will arrange this as an outpatient.  DISCHARGE DIAGNOSES: 1. Nausea, abdominal pain, and weakness - resolved. 2. Arteriosclerotic coronary artery disease with stable angina. 3. Obesity. 4. Celiac sprue. 5. Hypertension. 6. Cluster headaches.  DISCHARGE MEDICATIONS: 1. Halcion 0.25 mg q.h.s. 2. Lipitor 20 mg q.d. 3. Lopressor 25 mg  b.i.d. 4. Imdur 60 mg in the morning, 30 mg in the evening. 5. Topamax 50 mg in the morning, 200 mg in the evening for migraines. 6. Coated aspirin once q.d. 7. Nitroglycerin p.r.n.  DISCHARGE FOLLOWUP:  Will arrange outpatient evaluation with Dr. Vida Rigger.  DISCHARGE STATUS:  Improved.  The patient is okay to return to work on July 15, 2000. DD:  07/13/00 TD:  07/14/00 Job: [id number]

## 2010-10-25 NOTE — Op Note (Signed)
NAMEGHAZI, RUMPF                ACCOUNT NO.:  [id number]   MEDICAL RECORD NO.:  [id number]          PATIENT TYPE:  AMB   LOCATION:  DSC                          FACILITY:  [facility]   PHYSICIAN:  Sharolyn Douglas, M.D.        DATE OF BIRTH:  1956/04/30   DATE OF PROCEDURE:  04/06/2006  DATE OF DISCHARGE:                                 OPERATIVE REPORT   PREOPERATIVE DIAGNOSES:  Lumbar spondylosis.   PROCEDURE:  1. Bilateral L4-5 and L5-S1 facette joint injections  2. Fluoroscopic imaging used for needle placement of the above injections.   SURGEON:  Sharolyn Douglas, M.D.   ASSISTANT:  None.   ANESTHESIA:  MAC plus local.   COMPLICATIONS:  None.   ESTIMATED BLOOD LOSS:  None.   INDICATIONS:  The patient is a pleasant 55 year old male with persistent  back pain, thought to be secondary to lumbar spondylosis.  He now presents  for a trial of diagnostic and therapeutic facet joint injections.  The  risks, benefits and alternatives are reviewed.   PROCEDURE:  After an informed consent he was taken to the operating room and  turned prone.  He underwent sedation by anesthesia.  His back was prepped  and draped in the usual sterile fashion.  Using a posterior oblique  approach, the 22-gauge spinal needles were placed into the facet joints at  L4-5 and L5-S1 bilaterally.  A small amount of Omnipaque was injected into  each joint.  There was some intra-articular uptake at each joint and there  was no inner-vascular spread.  We then injected a solution of 1 mL of  preservative-free lidocaine and 40 mg of  Depo-Medrol into each facet joint.   The patient tolerated the procedure well.  There were no apparent  complications.  He was transferred to  the recovery in stable condition.   I reviewed post-injection instructions with him.  He will follow up in two  weeks.      Sharolyn Douglas, M.D.  Electronically Signed    MC/MEDQ  D:  04/08/2006  T:  04/08/2006  Job:  [id number]

## 2010-10-25 NOTE — Op Note (Signed)
NAMEKIMBERLY, Michael Hartman                ACCOUNT NO.:  [id number]   MEDICAL RECORD NO.:  [id number]          PATIENT TYPE:  AMB   LOCATION:  ENDO                         FACILITY:  [facility]   PHYSICIAN:  Petra Kuba, M.D.    DATE OF BIRTH:  03-16-1956   DATE OF PROCEDURE:  09/06/2004  DATE OF DISCHARGE:                                 OPERATIVE REPORT   PROCEDURE:  Colonoscopy with biopsy.   INDICATIONS:  Patient with history of colon polyps due for repeat screening.  Consent was signed after risks, benefits, methods, options thoroughly  discussed in the office.   MEDICINES USED:  Demerol 100, Versed 10.   PROCEDURE:  Rectal inspection is pertinent for external hemorrhoids. Digital  exam was negative. Video colonoscope was inserted, easily advanced to the  level of the ileocecal valve.  To advance into the cecal pole required  abdominal pressure; in fact, the scope was inserted a short ways into the  terminal ileum which was normal.  Photo documentation and random biopsies  were obtained. Scope was slowly withdrawn. Prep was adequate. There was some  liquid stool that required washing and suctioning.  On slow withdrawal  through the colon;  no polyps, tumors, masses or other abnormalities were  seen as we slowly withdrew back to the rectum.  Anorectal pull-through and  retroflexion confirmed some small hemorrhoids.  The scope was straightened  and readvanced a short ways up the left side of the colon.  Air was  suctioned, scope removed. The patient tolerated the procedure well. There  was no obvious immediate complication.   ENDOSCOPIC DIAGNOSIS:  1.  Internal-external hemorrhoids.  2.  Otherwise within normal limits to the terminal ileum.  Status post      biopsies of the terminal ileum.   PLAN:  Await pathology, rescreen in 5-10 years. Continue workup with an EGD.      MEM/MEDQ  D:  09/06/2004  T:  09/06/2004  Job:  [id number]   cc:   Petra Kuba, M.D.  [address]  [place]  [place] [address]  Fax: [phone]   Amy Y. [place], M.D.  [address]  [place]  [place] [address]  Fax: [phone]   Gaspar Garbe, M.D.  [address]  [place]  [place] [address]  Fax: [phone]

## 2010-10-25 NOTE — Op Note (Signed)
NAME:  Michael Hartman, Michael Hartman                          ACCOUNT NO.:  [id number]   MEDICAL RECORD NO.:  [id number]                   PATIENT TYPE:  AMB   LOCATION:  DAY                                  FACILITY:  [facility]   PHYSICIAN:  Erasmo Leventhal, M.D.         DATE OF BIRTH:  May 31, 1956   DATE OF PROCEDURE:  08/29/2003  DATE OF DISCHARGE:                                 OPERATIVE REPORT   PREOPERATIVE DIAGNOSES:  Left knee probable torn medial meniscus.   POSTOPERATIVE DIAGNOSES:  Left knee complex tear post medial meniscus.   PROCEDURE:  Left knee arthroscopic partial medial meniscectomy.   SURGEON:  Erasmo Leventhal, M.D.   ASSISTANT:  Jaquelyn Bitter. Chabon, P.A.   ANESTHESIA:  Knee block with general.   ESTIMATED BLOOD LOSS:  Less than 10 mL.   DRAINS:  None.   COMPLICATIONS:  None.   TOURNIQUET TIME:  None.   DISPOSITION:  To PACU stable.   DESCRIPTION OF PROCEDURE:  The patient was counseled in the holding area,  correct side was identified, IV was started, antibiotics were given. At this  time, he discussed with the anesthesiologist going to sleep. He was taken to  the operating room, placed under general anesthesia.  At this time, the left  lower extremity was elevated, it was prepped with Duraprep and awl draped in  a sterile fashion.  20 mL of 0.25% Marcaine with epinephrine was injected  superolaterally into the knee joint for a knee block. This was allowed to  set in the knee for a few minutes. A this point in time, the knee was then  arthroscoped through proximal, medial, anteromedial and anterolateral  portals.  Diagnostics taken. The patellofemoral joint revealed normal  tracking, suprapatellar pouch, medial and lateral gutters unremarkable. ACL  and PCL were intact. The lateral side was inspected with a normal lateral  compartment and also normal lateral meniscus. There also was some synovitis  laterally and I did a light synovectomy there.   The medial  side was inspected, the articular cartilage showed some mild  chondromalacia of the medial femorally condyle and there was a complex and  stable tear of the posterior horn of the medial meniscus.  Utilizing baskets  forceps and shaver, partial medial meniscectomy was performed back to a  stable base, properly beveled and contoured.   At this time, the ArthroCare system was utilized for electrocautery to  coagulated the periphery of the meniscus to stabilize it. After I trimmed  out the part that was torn, it appeared to be somewhat friable.  Therefore,  it was coagulated and sealed the edges down nicely.  The meniscus was then  probed and found to be completely stable at this point. The knee was  sequentially reinspected and there were no other abnormalities noted. After  copious irrigation, the arthroscopic equipment was removed.  The three  portals were closed with 4-0 nylon suture.  After  conferring with  anesthesia, another 20 mL of 0.25% Marcaine with epinephrine and 4 mg of  morphine sulfate was placed in the knee joint. Then 10 mL of 0.25% Marcaine  was placed in the portal sites. A sterile compressive dressing applied along  with TED hose and an ice pack. There were no complications. He was gently  awaken and taken from the operating room to PACU in stable condition. The  plan will be stabilization and then overnight stay for observation due to  severe sleep apnea.  He will be on a monitored bed and close observation.                                               Erasmo Leventhal, M.D.    RAC/MEDQ  D:  08/29/2003  T:  08/30/2003  Job:  [id number]

## 2010-10-25 NOTE — Op Note (Signed)
[name], [name]                ACCOUNT NO.:  [id number]   MEDICAL RECORD NO.:  [id number]          PATIENT TYPE:  AMB   LOCATION:  DSC                          FACILITY:  [facility]   PHYSICIAN:  Sharolyn Douglas, M.D.        DATE OF BIRTH:  July 06, 1955   DATE OF PROCEDURE:  11/03/2006  DATE OF DISCHARGE:                               OPERATIVE REPORT   DIAGNOSIS:  Lumbar spondylosis and chronic back pain.   PROCEDURE:  1. Bilateral L3-4, L4-5 and L5-S1 facet joint injections.  2. Fluoroscopic imaging used for needle placed of the above      injections.   SURGEON:  Sharolyn Douglas, MD   ASSISTANT:  None.   ANESTHESIA:  MAC plus local.   COMPLICATIONS:  None.   ESTIMATED BLOOD LOSS:  None.   INDICATIONS:  The patient is a pleasant 55 year old male with chronic  back pain thought to be secondary to lumbar spondylosis.  He now  presents for diagnostic and potentially therapeutic injections.  Risks,  benefits, and alternatives were reviewed.   PROCEDURE:  After informed consent, he was taken the operating room.  He  was turned prone.  His back was prepped and draped in the usual sterile  fashion.  He underwent sedation by anesthesia.  Fluoroscopy was brought  into the field and we identified the facet joints at L3-4, L4-5 and L5-  S1.  22 gauge Quincke spinal needles were advanced in the facet joints  bilaterally at each level.  Aspiration showed no blood.  A solution of  20 mg of Depo-Medrol and 0.5 cc of  1% preservative-free lidocaine  injected into each facet joint at L3-4, L4-5 and L5-S1 bilaterally.  Fluoroscopic images were saved.  The patient tolerated the procedure  well.  There were no apparent complications.  He was transferred to  recovery in stable condition.  I reviewed post injection instructions  with him and he will follow-up in 2-3 weeks.      Sharolyn Douglas, M.D.  Electronically Signed     MC/MEDQ  D:  11/03/2006  T:  11/03/2006  Job:  [id number]   cc:   Brett Canales A. Cleta Alberts,  M.D.

## 2010-10-25 NOTE — Op Note (Signed)
Michael Hartman, Michael Hartman                ACCOUNT NO.:  [id number]   MEDICAL RECORD NO.:  [id number]          PATIENT TYPE:  AMB   LOCATION:  DSC                          FACILITY:  [facility]   PHYSICIAN:  Leonides Grills, M.D.     DATE OF BIRTH:  05-May-1956   DATE OF PROCEDURE:  05/13/2005  DATE OF DISCHARGE:                                 OPERATIVE REPORT   PREOPERATIVE DIAGNOSES:  1.  Right calcaneal spur, anterior process.  2.  Right cuboid spur.   POSTOPERATIVE DIAGNOSES:  1.  Right calcaneal spur, anterior process.  2.  Right cuboid spur.   OPERATION:  1.  Excision, right calcaneal spur.  2.  Excision, right cuboid spur.   ANESTHESIA:  General.   SURGEON:  Leonides Grills, MD.   ASSISTANT:  Lianne Cure, PA.   ESTIMATED BLOOD LOSS:  Minimal.   TOURNIQUET TIME:  Approximately one-half hour.   COMPLICATIONS:  None.   DISPOSITION:  Stable to the PR.   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  He has subsequently  developed a large spur over this area that has become symptomatic.  He was  consented to the above procedure.  All his risks, which included infection,  nerve or vessel injury, persistent pain, worsening pain, prolonged recovery,  stiffness or arthritis of the CC joint with possible future fusion were all  explained.  Questions were encouraged and answered.   DESCRIPTION OF OPERATION:  The patient was brought to the operating room and  placed in the supine position.  After adequate general endotracheal tube  anesthesia was administered as well as Ancef 1 gm IV piggyback, a bump was  placed under the ipsilateral hip to internally rotate the right lower  extremity, and the right lower extremity was then prepped and draped in a  sterile manner over a proximally placed thigh tourniquet.  The limb was  gravity exsanguinated, and then the tourniquet was elevated to 290 mmHg.  A  longitudinal incision over the dorsal aspect of the right calcaneocuboid  joint was then made.  Dissection was carried down through the skin, and  hemostasis was obtained.  The extensor digitorum brevis was then identified  and retracted out of harm's way.  There was an enormous spur on the dorsal  aspect of the right calcaneocuboid joint and with a curved quarter-inch  osteotome this was resected.  We then took some time to debride this area  with a synovectomy rongeur and regular rongeur and palpated this to be flush  all the way across to the talar head area.  This was visualized as well.  The translation of the CC joint was maintained.  There were no impinging  areas as well.  The area was copiously irrigated with normal saline, the  tourniquet was deflated, hemostasis was obtained.  The skin was closed with  4-0 nylon suture.  A sterile dressing was applied.  A hard sole shoe was  applied.  The patient was stable to the PR.  Leonides Grills, M.D.  Electronically Signed     PB/MEDQ  D:  05/13/2005  T:  05/13/2005  Job:  [id number]

## 2010-10-25 NOTE — H&P (Signed)
[place]. [facility]  Patient:    Michael Hartman, Michael Hartman                       MRN: [id number] Adm. Date:  [id number] Attending:  [place], Peter Manning CC:         Peter M. [place], M.D.  Robert A. Eliezer Lofts., M.D.   History and Physical  HISTORY OF PRESENT ILLNESS:  This is 55 years of age and has a history of coronary artery disease undergoing right coronary stenting in 1998.  He had a repeat cardiac catheterization in 2000 at which time he was demonstrated to have irregularities in the LAD and diagonals and a clean circumflex with 50% proximal lesion and 99% occlusion in the distal right.  He had collaterals from the left coronary to the right coronary and it was felt that medical therapy was the most appropriate treatment at that time.  Today, he had abdominal pain after eating lunch.  This caused some nausea and diaphoresis. Shortly thereafter he had a vague chest discomfort he is not able to characterize.  He went to his primary care physician, [name]. Eliezer Lofts., M.D. and was told that he needed to come to the emergency room because of concern that this may be related to his heart.  In the emergency room he was started on IV nitroglycerin.  No actual chest pain was going on in the emergency room and he is currently totally free of any discomfort other than the feeling that he has been "beat up."  He is a very difficult patient to obtain a history from.  ALLERGIES:  PENICILLIN.  PAST MEDICAL HISTORY: 1. Coronary artery disease, status post stent RCA with virtual total occlusion    documented by catheterization in 2000 and left to right collaterals.  There    was mild to moderate disease in the LAD and circumflex. 2. Hypertension. 3. Cluster headaches. 4. History of siliac screw. 5. Hyperlipidemia.  MEDICATIONS: 1. Halcion 0.25 mg p.o. q.h.s. 2. Lipitor 20 mg per day. 3. Lopressor 25 mg p.o. b.i.d. 4. Imdur 60 mg in the morning and 30 mg in the  evening. 5. Topamax 50 mg in the morning and 200 mg in the evening for migraines. 6. Coated aspirin one per day. 7. Nitroglycerin sublingually p.r.n.  HABITS:  He does not smoke.  ALLERGIES:  PENICILLIN.  SOCIAL HISTORY:  Works at the [employer].  FAMILY HISTORY:  Cannot obtain.  REVIEW OF SYSTEMS:  Very diffuse attempt at obtaining review of systems.  He is not very helpful at answering questions in any meaningful manner.  PHYSICAL EXAMINATION:  GENERAL:  The patient is obese.  He is laying in a fetal position in the bed. He is not in pain.  His color is good.  VITAL SIGNS:  Blood pressure 110/70, heart rate 80, respirations 16 and nonlabored.  HEENT:  Unremarkable.  NECK:  No JVD, carotid bruits, or thyromegaly.  LUNGS:  Clear to auscultation and percussion.  HEART:  Normal.  No murmurs, rubs, clicks, or gallops heard.  ABDOMEN:  Soft.  Liver and spleen are not palpable.  Bowel sounds are normal.  EXTREMITIES:  Reveal no edema.  Pulses are 2+ and symmetrical in upper and lower extremities.  NEUROLOGICAL:  Unremarkable.  EKG reveals normal sinus rhythm, perhaps the first EKG reveals some ST elevation, although, I must say that both the initial and subsequent EKG could rightfully be interpreted as being normal.  ASSESSMENT: 1. Abdominal discomfort and vague chest discomfort thereafter, etiology    uncertain. 2. History of coronary artery disease with total occlusion of the right    coronary artery, status post stenting in 1998.  PLAN:  Admit to hospital.  Subcu Lovenox.  Enzymes to rule out MI.  EKG in the a.m.  Continue usual home medications. DD:  07/10/00 TD:  07/11/00 Job: [id number]

## 2010-10-25 NOTE — H&P (Signed)
[facility]  Patient:    Michael Hartman                        MRN: [id number] Adm. Date:  [id number] Attending:  [place], Peter Manning CC:         [facility]             Peter M. [place], M.D.                         History and Physical  REASON FOR ADMISSION:  Chest pain.  HISTORY OF PRESENT ILLNESS:  The patient is a 55 year old male with a known history of hypertension, hyperlipidemia and coronary artery disease.  He has a prior history of atypical chest pain and has had negative ECG and Cardiolite tests. e was admitted with what was felt to be unstable angina pectoris in December of 1998I On May 12, 1997, he had an angioplasty which was complicated by abrupt closure and eventually had a spiral dissection in the right coronary artery requiring a  total of three NIR stents.  Two 32 mm stents and a 16 mm stent to tackup the dissection.  He was treated with ReoPro and heparin.  He had some CPK bump with  that admission.  He was readmitted on December 28 of that year with recurrent symptoms and had intravascular ultrasound showing a distal area of stent overlap that was not fully expanded.  A short segment stenosis was ______ prolapse and  proximal stented site that could not be seen angiographically.  He was dilated ith a 2.75 Quantum Ranger and he had an additional 16 x 2.5 mm stent at the crux and was dilated with a 2.75 mm Quantum Ranger.  He did well following that but presented with some atypical chest pain in June of 1999 and had a negative dual  isotope perfusion scan.  He has done relatively well with some vague anginal type complaints and reportedly had a negative perfusion scan this year.  He has been  working the night shift and was awakened around 2:30 p.m. this afternoon from sleep because he had been sleeping during the day complaining of some chest discomfort. He took a nitroglycerin and had some relief.  He  describes it as a burning type  discomfort.  He had some back pain and did not feel well and decided to go on to work, but noted that he was driving erratically and had recurrent tightness and  chest discomfort and went home and took a total of three nitroglycerin and had partial relief with his symptoms then.  He had a repeat episode with some radiation up into his neck and some tingling involving the left arm and called and was advised to come to the emergency room.  He was begun on IV nitroglycerin and heparin here and his symptoms had resolved at the time of dictation.  He felt as if the symptoms were similar to his previous episodes in December.  PAST MEDICAL HISTORY:  Remarkable for hypertension, cluster headaches, hyperlipidemia, and a history of celiac sprue.  There is also a history of anxiety in the past.  PAST SURGICAL HISTORY:  None.  ALLERGIES:  PENICILLIN.  CURRENT MEDICATIONS: 1. Lopressor b.i.d. 2. Maxzide daily. 3. Aspirin daily. 4. Lipitor daily. 5. Halcion p.r.n. 6. Xanax p.r.n. 7. Topamax daily.  SOCIAL HISTORY:  He works for the [employer], is married.  His wife at  [facility] [employer] [employer].  He has no children.  No tobacco or alcohol abuse.  FAMILY HISTORY:  As recorded in old records, dictated in December 1998, is reviewed and unchanged.  REVIEW OF SYSTEMS:  He has been obese but has been able to lose down to his current weight of 235.  No skin changes.  No ENT changes.  He has a history of celiac disease and avoids glutin and other wheat products.  No bowel or bladder problems. No arthralgias.  He has a history of cluster headaches for which he sees Dr. Meryl Crutch and has been on Topamax for approximately the past year.  The remainder of review of systems is otherwise unremarkable except as noted above.  PHYSICAL EXAMINATION:  GENERAL:  He is a pleasant male.  VITAL SIGNS:  His blood pressure is currently 130/75.  Pulse is 76.  SKIN:  Warm and  dry.  HEART:  ENT:  EOMI.  PERLA.  CNS clear.  Pharynx negative.  NECK:  Supple without masses or thyromegaly.  LUNGS:  Clear.  CARDIAC:  Normal S1, S2.  No S3, S4 or murmur.  ABDOMEN:  Soft, nontender.  No masses.  No organomegaly.  No aneurysm.  Femoral  pulses 2+, pedal pulses 2+.  No edema noted.  NEUROLOGICAL:  Normal.  A 12-lead ECG is normal.  There is some baseline artifact in the lateral leads.  Chest x-ray unremarkable.  Labs pending at the time of dictation.  IMPRESSION: 1. Chest pain essentially compatible with unstable angina pectoris in a patient    with known coronary artery disease. 2. Coronary artery disease with:    a. Previous angioplasty and stenting of a long spiral dissection following       abrupt closure.    b. Previous repeat angioplasty and stenting approximately three weeks following       that. 3. Hypertension, under treatment. 4. Dyslipidemia, under treatment. 5. History of celiac disease. 6. Obesity.  RECOMMENDATIONS:  Admit and begin IV heparin and nitroglycerin.  Continue beta blockers and aspirin, begin Plavix.  Likely will need cardiac catheterization to access the long-term results of the previously stented segments as well as to look for stenoses in other branch, vessels if needed. DD:  05/09/99 TD:  05/10/99 Job: [id number]

## 2010-10-25 NOTE — Op Note (Signed)
Michael Hartman, Michael Hartman                ACCOUNT NO.:  [id number]   MEDICAL RECORD NO.:  [id number]          PATIENT TYPE:  AMB   LOCATION:  ENDO                         FACILITY:  [facility]   PHYSICIAN:  Petra Kuba, M.D.    DATE OF BIRTH:  1955-09-17   DATE OF PROCEDURE:  09/06/2004  DATE OF DISCHARGE:                                 OPERATIVE REPORT   PROCEDURE:  Esophagogastroduodenoscopy with biopsy.   INDICATIONS:  History of sprue, chronic reflux.  Want to re-evaluate.   Consent was signed after risks, benefits, methods, options thoroughly  discussed multiple times in the past.   ADDITIONAL MEDICATIONS:  Demerol 20, Versed 3.   PROCEDURE:  The video endoscope was inserted by direct vision.  The  esophagus was normal.  He did have a tiny hiatal hernia.  The scope was  passed into the stomach and advanced through a normal antrum, normal  pylorus, into a normal duodenal bulb and around the C loop to a normal  second and probably third part of the duodenum.  A few duodenal biopsies  were obtained and put in a second container.  The scope was slowly  withdrawn.  A good look at the bulb was normal.  The scope was withdrawn  back to the stomach and retroflexed.  The cardia, fundus, angularis, lesser  and greater curve were normal on retroflexed visualization.  Straight  visualization of the stomach did not reveal any additional findings.  The  air was suctioned.  The scope was slowly withdrawn. Again, a good look at  the esophagus was normal.  The scope was removed.  The patient tolerated the  procedure well.  There was no obvious immediate complication.   ENDOSCOPIC DIAGNOSES:  1.  Tiny hiatal hernia.  2.  Otherwise within-normal-limits esophagogastroduodenoscopy with biopsies      of the second and probably third part of the duodenum.   PLAN:  Await pathology.  Continue Nexium.  Follow up p.r.n. or in six  months.  Return to the care of Drs. [name] and [place] for the customary  health-care maintenance and screening.      MEM/MEDQ  D:  09/06/2004  T:  09/06/2004  Job:  [id number]   cc:   Gaspar Garbe, M.D.  [address]  [place]  [place] [address]  Fax: [phone]   Peter M. [place], M.D.  [address], [address]  [place], [place] [address]  Fax: [phone]

## 2010-11-18 ENCOUNTER — Other Ambulatory Visit: Payer: Self-pay | Admitting: Cardiology

## 2010-11-18 NOTE — Telephone Encounter (Signed)
Med refill

## 2010-12-14 ENCOUNTER — Other Ambulatory Visit: Payer: Self-pay | Admitting: Cardiology

## 2010-12-16 NOTE — Telephone Encounter (Signed)
escribe medication per fax request  

## 2011-03-06 ENCOUNTER — Encounter: Payer: Self-pay | Admitting: Cardiology

## 2011-03-10 ENCOUNTER — Ambulatory Visit: Payer: 59 | Admitting: Cardiology

## 2011-03-13 ENCOUNTER — Telehealth: Payer: Self-pay | Admitting: Cardiology

## 2011-03-13 NOTE — Telephone Encounter (Signed)
Cancelled appt for 1011 He said he will call back to reschedule but he needs refill of all his meds sent to [facility]

## 2011-03-17 ENCOUNTER — Other Ambulatory Visit: Payer: Self-pay | Admitting: *Deleted

## 2011-03-17 DIAGNOSIS — I10 Essential (primary) hypertension: Secondary | ICD-10-CM

## 2011-03-17 MED ORDER — METOPROLOL TARTRATE 50 MG PO TABS: 50.0000 mg | ORAL_TABLET | Freq: Two times a day (BID) | ORAL | Status: DC

## 2011-03-17 MED ORDER — OLMESARTAN MEDOXOMIL 40 MG PO TABS: 40.0000 mg | ORAL_TABLET | Freq: Every day | ORAL | Status: DC

## 2011-03-17 MED ORDER — AMLODIPINE BESYLATE 5 MG PO TABS
5.0000 mg | ORAL_TABLET | Freq: Every day | ORAL | Status: DC
Start: 2011-03-17 — End: 2012-03-23

## 2011-03-17 MED ORDER — HYDROCHLOROTHIAZIDE 25 MG PO TABS: 25.0000 mg | ORAL_TABLET | Freq: Every day | ORAL | Status: DC

## 2011-03-17 MED ORDER — ATORVASTATIN CALCIUM 80 MG PO TABS: 80.0000 mg | ORAL_TABLET | Freq: Every day | ORAL | Status: DC

## 2011-03-17 NOTE — Telephone Encounter (Signed)
REFILLED PATIENTS RX THROUGH [facility]. CALLED AND VERIFIED WHICH STRENGTH OF THE BENICAR HE WAS TAKING. I HAVE REFILLED THE RX.

## 2011-03-19 ENCOUNTER — Ambulatory Visit: Payer: 59 | Admitting: Cardiology

## 2011-03-20 ENCOUNTER — Ambulatory Visit: Payer: 59 | Admitting: Cardiology

## 2011-04-02 ENCOUNTER — Encounter: Payer: Self-pay | Admitting: Cardiology

## 2011-04-02 ENCOUNTER — Ambulatory Visit (INDEPENDENT_AMBULATORY_CARE_PROVIDER_SITE_OTHER): Payer: 59 | Admitting: Cardiology

## 2011-04-02 VITALS — BP 137/86 | HR 74 | Ht 74.0 in | Wt 252.1 lb

## 2011-04-02 DIAGNOSIS — I2510 Atherosclerotic heart disease of native coronary artery without angina pectoris: Secondary | ICD-10-CM

## 2011-04-02 DIAGNOSIS — E7800 Pure hypercholesterolemia, unspecified: Secondary | ICD-10-CM

## 2011-04-02 DIAGNOSIS — I10 Essential (primary) hypertension: Secondary | ICD-10-CM

## 2011-04-02 DIAGNOSIS — E785 Hyperlipidemia, unspecified: Secondary | ICD-10-CM

## 2011-04-02 MED ORDER — NITROGLYCERIN 0.4 MG SL SUBL: 0.4000 mg | SUBLINGUAL_TABLET | SUBLINGUAL | Status: DC | PRN

## 2011-04-02 NOTE — Patient Instructions (Signed)
Reschedule your physical and labs with [name].  Continue your current medications.  I will see you again in 6 months.

## 2011-04-03 NOTE — Assessment & Plan Note (Signed)
Blood pressure control is good. We will continue his current regimen.

## 2011-04-03 NOTE — Progress Notes (Signed)
Michael Hartman Date of Birth: 1955-10-08 Medical Record #[id number]  History of Present Illness: Gland is seen today for followup. He has a history of coronary disease with remote stenting of the right coronary in 1998. He has a history of diabetes, dyslipidemia, and hypertension. He reports that he is doing well. He joined [employer] and has lost 10 pounds. He is now working day shift which he finds easier. He denies any chest pain or shortness of breath. He's had no palpitations.  Current Outpatient Prescriptions on File Prior to Visit  Medication Sig Dispense Refill  . ALPHA LIPOIC ACID PO Take 600 mg by mouth daily.        Marland Kitchen amLODipine (NORVASC) 5 MG tablet Take 1 tablet (5 mg total) by mouth daily.  90 tablet  3  . aspirin 325 MG tablet Take 325 mg by mouth daily.        Marland Kitchen atorvastatin (LIPITOR) 80 MG tablet Take 1 tablet (80 mg total) by mouth daily.  90 tablet  3  . fluticasone (FLONASE) 50 MCG/ACT nasal spray Daily.      . hydrochlorothiazide (HYDRODIURIL) 25 MG tablet Take 1 tablet (25 mg total) by mouth daily.  90 tablet  3  . metFORMIN (GLUCOPHAGE) 500 MG tablet Take 500 mg by mouth 2 (two) times daily.        . methocarbamol (ROBAXIN) 500 MG tablet Take 1,000 mg by mouth. Take as needed (unsure of dosage)      . metoprolol (LOPRESSOR) 50 MG tablet Take 1 tablet (50 mg total) by mouth 2 (two) times daily.  180 tablet  3  . mirtazapine (REMERON) 15 MG tablet Take 2 to 3 tabs per night as needed.       . Multiple Vitamin (MULTIVITAMIN) capsule Take 1 capsule by mouth daily.        . nitroGLYCERIN (NITROSTAT) 0.4 MG SL tablet Place 1 tablet (0.4 mg total) under the tongue every 5 (five) minutes as needed for chest pain.  90 tablet  12  . olmesartan (BENICAR) 40 MG tablet Take 1 tablet (40 mg total) by mouth daily.  90 tablet  3  . omeprazole (PRILOSEC) 40 MG capsule Take 40 mg by mouth daily.        Marland Kitchen oxyCODONE-acetaminophen (PERCOCET) 10-325 MG per tablet Take 1 tablet by mouth  3 (three) times daily.          Allergies  Allergen Reactions  . Amoxicillin   . Penicillins     Past Medical History  Diagnosis Date  . OSA (obstructive sleep apnea)   . Chronic rhinitis   . Hypercholesteremia   . Hypertension   . Coronary artery disease   . GERD (gastroesophageal reflux disease)   . Celiac disease   . History of migraine headaches     Past Surgical History  Procedure Date  . Cardiac catheterization 06/06/1997    EF 65%  . Coronary stent placement   . Nasal sinus surgery   . Cardiovascular stress test 12/07/2007    EF 58%    History  Smoking status  . Former Smoker -- 4.0 packs/day for 20 years  . Quit date: 06/09/1985  Smokeless tobacco  . Not on file    History  Alcohol Use: Not on file    Family History  Problem Relation Age of Onset  . Hypertension Mother   . Stroke Father   . Hypertension Father     Review of Systems: As noted in  history of present illness..  All other systems were reviewed and are negative.  Physical Exam: BP 137/86  Pulse 74  Ht 6\' 2"  (1.88 m)  Wt 252 lb 1.9 oz (114.361 kg)  BMI 32.37 kg/m2 He is an obese white male in no acute distress.The patient is alert and oriented x 3.  The mood and affect are normal.  The skin is warm and dry.  Color is normal.  The HEENT exam reveals that the sclera are nonicteric.  The mucous membranes are moist.  The carotids are 2+ without bruits.  There is no thyromegaly.  There is no JVD.  The lungs are clear.  The chest wall is non tender.  The heart exam reveals a regular rate with a normal S1 and S2.  There are no murmurs, gallops, or rubs.  The PMI is not displaced.   Abdominal exam reveals good bowel sounds.  There is no guarding or rebound.  There is no hepatosplenomegaly or tenderness.  There are no masses.  Exam of the legs reveal no clubbing, cyanosis, or edema.  The legs are without rashes.  The distal pulses are intact.  Cranial nerves II - XII are intact.  Motor and sensory  functions are intact.  The gait is normal.  LABORATORY DATA:   Assessment / Plan:

## 2011-04-03 NOTE — Assessment & Plan Note (Signed)
He has not not had recent evaluation of either his diabetes or his lipids. He states he missed his appointment with his primary physician. I recommended he reschedule this visit and have fasting lab work done.

## 2011-04-03 NOTE — Assessment & Plan Note (Signed)
Remote history of stenting of the right coronary in 1998. He is asymptomatic. His last stress test was in June of 2009. We will continue with risk factor modification.

## 2011-09-17 ENCOUNTER — Ambulatory Visit (INDEPENDENT_AMBULATORY_CARE_PROVIDER_SITE_OTHER): Payer: 59 | Admitting: Physician Assistant

## 2011-09-17 VITALS — BP 132/80 | HR 64 | Temp 98.4°F | Resp 16 | Ht 73.0 in | Wt 264.0 lb

## 2011-09-17 DIAGNOSIS — R05 Cough: Secondary | ICD-10-CM

## 2011-09-17 DIAGNOSIS — R059 Cough, unspecified: Secondary | ICD-10-CM

## 2011-09-17 DIAGNOSIS — J069 Acute upper respiratory infection, unspecified: Secondary | ICD-10-CM

## 2011-09-17 MED ORDER — IPRATROPIUM BROMIDE 0.06 % NA SOLN: 2.0000 | Freq: Four times a day (QID) | NASAL | Status: DC

## 2011-09-17 MED ORDER — DOXYCYCLINE HYCLATE 100 MG PO TABS: 100.0000 mg | ORAL_TABLET | Freq: Two times a day (BID) | ORAL | Status: AC

## 2011-09-17 MED ORDER — PROMETHAZINE-DM 6.25-15 MG/5ML PO SYRP: 5.0000 mL | ORAL_SOLUTION | Freq: Every day | ORAL | Status: AC

## 2011-09-17 NOTE — Progress Notes (Signed)
  Subjective:    Patient ID: Michael Hartman, male    DOB: Oct 25, 1955, 56 y.o.   MRN: [id number]  HPI Mr. Cornette is here today with 5 days of cough, PND and feeling achey and worn down.  No fever or chills.  No SOB.  No N/V.  Does not think this is the flu.  Non smoker but has smoked in the past.    Past Medical History  Diagnosis Date  . OSA (obstructive sleep apnea)   . Chronic rhinitis   . Hypercholesteremia   . Hypertension   . Coronary artery disease   . GERD (gastroesophageal reflux disease)   . Celiac disease   . History of migraine headaches       Review of Systems As above     Objective:   Physical Exam  Constitutional: He appears well-developed and well-nourished.  HENT:  Right Ear: Tympanic membrane normal.  Left Ear: Tympanic membrane normal.  Nose: Mucosal edema and rhinorrhea present.  Mouth/Throat: No posterior oropharyngeal erythema.  Cardiovascular: Normal rate and regular rhythm.   Pulmonary/Chest: Effort normal and breath sounds normal.       Harsh BS  Lymphadenopathy:    He has no cervical adenopathy.          Assessment & Plan:  Cough URI  Doxycycline 100, Phenergan DM, Mucinex DM. Watch for SOB, fever, worsening cough.

## 2011-09-25 ENCOUNTER — Ambulatory Visit (INDEPENDENT_AMBULATORY_CARE_PROVIDER_SITE_OTHER): Payer: 59 | Admitting: Family Medicine

## 2011-09-25 ENCOUNTER — Ambulatory Visit: Payer: 59

## 2011-09-25 VITALS — BP 130/90 | HR 74 | Temp 98.2°F | Resp 18 | Ht 73.0 in | Wt 256.0 lb

## 2011-09-25 DIAGNOSIS — R05 Cough: Secondary | ICD-10-CM

## 2011-09-25 DIAGNOSIS — R059 Cough, unspecified: Secondary | ICD-10-CM

## 2011-09-25 DIAGNOSIS — J988 Other specified respiratory disorders: Secondary | ICD-10-CM

## 2011-09-25 MED ORDER — AZITHROMYCIN 250 MG PO TABS: ORAL_TABLET | ORAL | Status: AC

## 2011-09-25 MED ORDER — FLUTICASONE PROPIONATE 50 MCG/ACT NA SUSP: 2.0000 | Freq: Every day | NASAL | Status: DC

## 2011-09-25 MED ORDER — HYDROCODONE-HOMATROPINE 5-1.5 MG/5ML PO SYRP: 5.0000 mL | ORAL_SOLUTION | Freq: Every evening | ORAL | Status: DC | PRN

## 2011-09-25 NOTE — Progress Notes (Signed)
[facility]:  Office Visit  Chief Complaint:  Chief Complaint  Patient presents with  . URI  . Cough  . Follow-up    not better from last OV    HPI: Michael Hartman is a 56 y.o. male who complains of minimal  improvement since last seen 8 days ago for URI. Continues to have cough, mostly at night. Feels it in chest. + chills, no fevers. No sinus pressure. Uses 3-4 pillows to prop himself up. Not able to use CPAP at night due to cough. No PND, edema in Michael Hartman  Past Medical History  Diagnosis Date  . OSA (obstructive sleep apnea)   . Chronic rhinitis   . Hypercholesteremia   . Hypertension   . Coronary artery disease   . GERD (gastroesophageal reflux disease)   . Celiac disease   . History of migraine headaches    Past Surgical History  Procedure Date  . Cardiac catheterization 06/06/1997    EF 65%  . Coronary stent placement   . Nasal sinus surgery   . Cardiovascular stress test 12/07/2007    EF 58%   History   Social History  . Marital Status: Married    Spouse Name: N/A    Number of Children: N/A  . Years of Education: N/A   Social History Main Topics  . Smoking status: Former Smoker -- 4.0 packs/day for 20 years    Quit date: 06/09/1985  . Smokeless tobacco: None  . Alcohol Use: None  . Drug Use: None  . Sexually Active: None   Other Topics Concern  . None   Social History Narrative  . None   Family History  Problem Relation Age of Onset  . Hypertension Mother   . Stroke Father   . Hypertension Father    Allergies  Allergen Reactions  . Amoxicillin   . Penicillins     Child hood   Prior to Admission medications   Medication Sig Start Date End Date Taking? Authorizing Provider  ALPHA LIPOIC ACID PO Take 600 mg by mouth daily.     Yes Historical Provider, MD  amLODipine (NORVASC) 5 MG tablet Take 1 tablet (5 mg total) by mouth daily. 03/17/11  Yes Peter M [place], MD  aspirin 325 MG tablet Take 325 mg by mouth daily.     Yes  Historical Provider, MD  atorvastatin (LIPITOR) 80 MG tablet Take 1 tablet (80 mg total) by mouth daily. 03/17/11  Yes Peter M [place], MD  doxycycline (VIBRA-TABS) 100 MG tablet Take 1 tablet (100 mg total) by mouth 2 (two) times daily. 09/17/11 09/27/11 Yes Venora Maples Warrick, PA-C  fluticasone (FLONASE) 50 MCG/ACT nasal spray Daily. 01/02/11  Yes Historical Provider, MD  hydrochlorothiazide (HYDRODIURIL) 25 MG tablet Take 1 tablet (25 mg total) by mouth daily. 03/17/11  Yes Peter M [place], MD  metFORMIN (GLUCOPHAGE) 500 MG tablet Take 500 mg by mouth 2 (two) times daily.     Yes Historical Provider, MD  methocarbamol (ROBAXIN) 500 MG tablet Take 1,000 mg by mouth. Take as needed (unsure of dosage)   Yes Historical Provider, MD  metoprolol (LOPRESSOR) 50 MG tablet Take 1 tablet (50 mg total) by mouth 2 (two) times daily. 03/17/11  Yes Peter M [place], MD  mirtazapine (REMERON) 15 MG tablet Take 2 to 3 tabs per night as needed.    Yes Historical Provider, MD  morphine (KADIAN) 30 MG 24 hr capsule Take 30 mg by mouth 2 (two) times daily.  Yes Historical Provider, MD  Multiple Vitamin (MULTIVITAMIN) capsule Take 1 capsule by mouth daily.     Yes Historical Provider, MD  nitroGLYCERIN (NITROSTAT) 0.4 MG SL tablet Place 1 tablet (0.4 mg total) under the tongue every 5 (five) minutes as needed for chest pain. 04/02/11 04/01/12 Yes Peter M [place], MD  olmesartan (BENICAR) 40 MG tablet Take 1 tablet (40 mg total) by mouth daily. 03/17/11  Yes Peter M [place], MD  Olmesartan-Amlodipine-HCTZ Avala) 40-5-25 MG TABS Take by mouth daily.   Yes Historical Provider, MD  omeprazole (PRILOSEC) 40 MG capsule Take 40 mg by mouth daily.     Yes Historical Provider, MD  oxyCODONE-acetaminophen (PERCOCET) 10-325 MG per tablet Take 1 tablet by mouth 3 (three) times daily.     Yes Historical Provider, MD  HYDROcodone-homatropine (HYCODAN) 5-1.5 MG/5ML syrup as directed. 02/27/11   Historical Provider, MD  ipratropium  (ATROVENT) 0.06 % nasal spray Place 2 sprays into the nose 4 (four) times daily. 09/17/11 09/16/12  Pattricia Boss, PA-C  promethazine-dextromethorphan (PROMETHAZINE-DM) 6.25-15 MG/5ML syrup Take 5 mLs by mouth 6 (six) times daily. 09/17/11 09/24/11  Pattricia Boss, PA-C     ROS: The patient denies fevers, night sweats, unintentional weight loss, chest pain, palpitations, wheezing, dyspnea on exertion, nausea, vomiting, abdominal pain, dysuria, hematuria, melena, numbness, weakness, or tingling. + continuous cough  All other systems have been reviewed and were otherwise negative with the exception of those mentioned in the HPI and as above.    PHYSICAL EXAM: Filed Vitals:   09/25/11 0743  BP: 142/105  Pulse: 74  Temp: 98.2 F (36.8 C)  Resp: 18   Filed Vitals:   09/25/11 0743  Height: 6\' 1"  (1.854 m)  Weight: 256 lb (116.121 kg)   Body mass index is 33.78 kg/(m^2).  General: Alert, no acute distress. obese HEENT:  Normocephalic, atraumatic, oropharynx patent. Tm nl. No exudates. Boggy red nares Cardiovascular:  Regular rate and rhythm, no rubs murmurs or gallops.  No Carotid bruits, radial pulse intact. No pedal edema.  Respiratory: Clear to auscultation bilaterally.  No wheezes, rales, or rhonchi.  No cyanosis, no use of accessory musculature GI: No organomegaly, abdomen is soft and non-tender, positive bowel sounds.  No masses. Skin: No rashes. Neurologic: Facial musculature symmetric. Psychiatric: Patient is appropriate throughout our interaction. Lymphatic: No cervical lymphadenopathy Musculoskeletal: Gait intact.   LABS:    EKG/XRAY:   Primary read interpreted by Dr. Conley Rolls at [facility]. No acute cardiopulmonary process.    ASSESSMENT/PLAN: Encounter Diagnoses  Name Primary?  . Cough Yes  . Respiratory infection     Rx Hycodan Flonase Z-pack F/u if no improvement in 1 week or prn   Taj Arteaga PHUONG, DO 09/25/2011 8:19 AM

## 2012-03-23 ENCOUNTER — Encounter: Payer: Self-pay | Admitting: Cardiology

## 2012-03-23 ENCOUNTER — Ambulatory Visit (INDEPENDENT_AMBULATORY_CARE_PROVIDER_SITE_OTHER): Payer: 59 | Admitting: Cardiology

## 2012-03-23 VITALS — BP 140/92 | HR 73 | Ht 73.0 in | Wt 260.8 lb

## 2012-03-23 DIAGNOSIS — I10 Essential (primary) hypertension: Secondary | ICD-10-CM

## 2012-03-23 DIAGNOSIS — E119 Type 2 diabetes mellitus without complications: Secondary | ICD-10-CM

## 2012-03-23 DIAGNOSIS — E1169 Type 2 diabetes mellitus with other specified complication: Secondary | ICD-10-CM | POA: Insufficient documentation

## 2012-03-23 DIAGNOSIS — E669 Obesity, unspecified: Secondary | ICD-10-CM

## 2012-03-23 DIAGNOSIS — I2510 Atherosclerotic heart disease of native coronary artery without angina pectoris: Secondary | ICD-10-CM

## 2012-03-23 DIAGNOSIS — E7800 Pure hypercholesterolemia, unspecified: Secondary | ICD-10-CM

## 2012-03-23 HISTORY — DX: Type 2 diabetes mellitus with other specified complication: E11.69

## 2012-03-23 HISTORY — DX: Type 2 diabetes mellitus with other specified complication: E66.9

## 2012-03-23 NOTE — Patient Instructions (Signed)
You need to exercise more and lose weight.  I will see you in one year.

## 2012-03-23 NOTE — Progress Notes (Signed)
Michael Hartman Date of Birth: May 15, 1956 Medical Record #[id number]  History of Present Illness: Michael Hartman is seen today for followup. He has a history of coronary disease with remote stenting of the right coronary in 1998. He has a history of diabetes, dyslipidemia, and hypertension. He has done well from a cardiac standpoint. He denies any chest pain, shortness of breath, or palpitations. He has had a recent flare of arthritis and bone spurs in his right elbow. This has limited his activity. He has gained 8 pounds. He admits to drinking a lot of diet sodas. He reports blood sugars have been between 75 and 105. He is scheduled for complete physical in December with his primary care.  Current Outpatient Prescriptions on File Prior to Visit  Medication Sig Dispense Refill  . ALPHA LIPOIC ACID PO Take 600 mg by mouth daily.        Marland Kitchen aspirin 325 MG tablet Take 325 mg by mouth daily.        Marland Kitchen atorvastatin (LIPITOR) 80 MG tablet Take 1 tablet (80 mg total) by mouth daily.  90 tablet  3  . metFORMIN (GLUCOPHAGE) 500 MG tablet Take 500 mg by mouth 2 (two) times daily.        . methocarbamol (ROBAXIN) 500 MG tablet Take 1,000 mg by mouth. Take as needed (unsure of dosage)      . metoprolol (LOPRESSOR) 50 MG tablet Take 1 tablet (50 mg total) by mouth 2 (two) times daily.  180 tablet  3  . mirtazapine (REMERON) 15 MG tablet Take 2 to 3 tabs per night as needed.       . Multiple Vitamin (MULTIVITAMIN) capsule Take 1 capsule by mouth daily.        . nitroGLYCERIN (NITROSTAT) 0.4 MG SL tablet Place 1 tablet (0.4 mg total) under the tongue every 5 (five) minutes as needed for chest pain.  90 tablet  12  . Olmesartan-Amlodipine-HCTZ (TRIBENZOR) 40-5-25 MG TABS Take by mouth daily.      Marland Kitchen omeprazole (PRILOSEC) 40 MG capsule Take 40 mg by mouth daily.        Marland Kitchen oxyCODONE-acetaminophen (PERCOCET) 10-325 MG per tablet Take 1 tablet by mouth 3 (three) times daily.        . sitaGLIPtin (JANUVIA) 100 MG tablet Take 100  mg by mouth daily.      . TRADJENTA 5 MG TABS tablet 5 mg daily.         Allergies  Allergen Reactions  . Amoxicillin   . Penicillins     Child hood    Past Medical History  Diagnosis Date  . OSA (obstructive sleep apnea)   . Chronic rhinitis   . Hypercholesteremia   . Hypertension   . Coronary artery disease   . GERD (gastroesophageal reflux disease)   . Celiac disease   . History of migraine headaches     Past Surgical History  Procedure Date  . Cardiac catheterization 06/06/1997    EF 65%  . Coronary stent placement   . Nasal sinus surgery   . Cardiovascular stress test 12/07/2007    EF 58%    History  Smoking status  . Former Smoker -- 4.0 packs/day for 20 years  . Quit date: 06/09/1985  Smokeless tobacco  . Not on file    History  Alcohol Use: Not on file    Family History  Problem Relation Age of Onset  . Hypertension Mother   . Stroke Father   . Hypertension  Father     Review of Systems: As noted in history of present illness..  All other systems were reviewed and are negative.  Physical Exam: BP 140/92  Pulse 73  Ht 6\' 1"  (1.854 m)  Wt 118.298 kg (260 lb 12.8 oz)  BMI 34.41 kg/m2  SpO2 96% He is an obese white male in no acute distress.The patient is alert and oriented x 3.  The mood and affect are normal.  The skin is warm and dry.  Color is normal.  The HEENT exam reveals that the sclera are nonicteric.  The mucous membranes are moist.  The carotids are 2+ without bruits.  There is no thyromegaly.  There is no JVD.  The lungs are clear.  The chest wall is non tender.  The heart exam reveals a regular rate with a normal S1 and S2.  There are no murmurs, gallops, or rubs.  The PMI is not displaced.   Abdominal exam reveals good bowel sounds.  There is no guarding or rebound.  There is no hepatosplenomegaly or tenderness.  There are no masses.  Exam of the legs reveal no clubbing, cyanosis, or edema.  The legs are without rashes.  The distal pulses  are intact.  Cranial nerves II - XII are intact.  Motor and sensory functions are intact.  The gait is normal.  LABORATORY DATA: ECG today demonstrates normal sinus rhythm with a rate of 58 beats per minute. It is normal.  Assessment / Plan: 1. Coronary disease with remote stenting of the right coronary. He is asymptomatic. His last stress test was in 2009 and was normal. I am encouraged him to increase his aerobic activity. He will continue on his current therapy including amlodipine, aspirin, metoprolol, and statin therapy. I will followup in one year.  2. Hypertension continue therapy with amlodipine, metoprolol, HCTZ, and Benicar.  3. Hyperlipidemia. We'll await his fasting lab work in December. Continue Lipitor.  Diabetes mellitus type 2.

## 2012-04-21 ENCOUNTER — Other Ambulatory Visit: Payer: Self-pay

## 2012-04-21 MED ORDER — ATORVASTATIN CALCIUM 80 MG PO TABS: 80.0000 mg | ORAL_TABLET | Freq: Every day | ORAL | Status: DC

## 2012-04-23 ENCOUNTER — Other Ambulatory Visit: Payer: Self-pay

## 2012-04-23 MED ORDER — ATORVASTATIN CALCIUM 80 MG PO TABS: 80.0000 mg | ORAL_TABLET | Freq: Every day | ORAL | Status: DC

## 2012-04-27 ENCOUNTER — Other Ambulatory Visit: Payer: Self-pay | Admitting: *Deleted

## 2012-04-27 MED ORDER — METOPROLOL TARTRATE 50 MG PO TABS: 50.0000 mg | ORAL_TABLET | Freq: Two times a day (BID) | ORAL | Status: DC

## 2012-10-18 ENCOUNTER — Ambulatory Visit (INDEPENDENT_AMBULATORY_CARE_PROVIDER_SITE_OTHER): Payer: 59 | Admitting: Emergency Medicine

## 2012-10-18 VITALS — BP 144/86 | HR 75 | Temp 98.6°F | Resp 17 | Ht 74.0 in | Wt 253.0 lb

## 2012-10-18 DIAGNOSIS — S0591XA Unspecified injury of right eye and orbit, initial encounter: Secondary | ICD-10-CM

## 2012-10-18 DIAGNOSIS — S0560XA Penetrating wound without foreign body of unspecified eyeball, initial encounter: Secondary | ICD-10-CM

## 2012-10-18 MED ORDER — TOBRAMYCIN 0.3 % OP OINT: TOPICAL_OINTMENT | Freq: Three times a day (TID) | OPHTHALMIC | Status: DC

## 2012-10-18 NOTE — Progress Notes (Signed)
  Subjective:    Patient ID: OAKLYN MANS, male    DOB: [birth year], 57 y.o.   MRN: [id number]  HPI patient presents with onset last night of discomfort in his right. Today he has a foreign body sensation in the. He called yesterday but does not remember getting anything in his eye. He has a tender swollen area at the corner of the on the right the he has no visual problems.    Review of Systems     Objective:   Physical Exam at the junction of the upper and lower lid of the right eye is a small 3 mm ulcerated area with some purulence. The pupil is reactive to light. The disc is normal. The lid was everted and swabbed here 2 drops of Pontocaine were instilled fluorescein was used there is no corneal uptake. No foreign body was seen        Assessment & Plan:  It appears the patient has superficial infection at the junction with the upper and lower lids join at the lateral border of the eyelids. Culture was done of this area for both virus and wound.

## 2012-10-20 LAB — HERPES SIMPLEX VIRUS CULTURE: Organism ID, Bacteria: NOT DETECTED

## 2012-10-21 LAB — WOUND CULTURE
Gram Stain: NONE SEEN
Gram Stain: NONE SEEN
Gram Stain: NONE SEEN

## 2012-10-21 MED ORDER — DOXYCYCLINE HYCLATE 100 MG PO TABS: 100.0000 mg | ORAL_TABLET | Freq: Two times a day (BID) | ORAL | Status: DC

## 2012-10-21 NOTE — Addendum Note (Signed)
Addended by: Johnnette Litter on: 10/21/2012 07:38 PM   Modules accepted: Orders

## 2012-12-15 ENCOUNTER — Ambulatory Visit (INDEPENDENT_AMBULATORY_CARE_PROVIDER_SITE_OTHER): Payer: 59 | Admitting: Physician Assistant

## 2012-12-15 VITALS — BP 130/90 | HR 100 | Temp 98.3°F | Resp 16 | Ht 73.5 in | Wt 244.0 lb

## 2012-12-15 DIAGNOSIS — G4700 Insomnia, unspecified: Secondary | ICD-10-CM

## 2012-12-15 MED ORDER — HYDROXYZINE HCL 25 MG PO TABS: 25.0000 mg | ORAL_TABLET | Freq: Every evening | ORAL | Status: DC | PRN

## 2012-12-15 NOTE — Patient Instructions (Signed)
If your symptoms persist, please contact Dr. Deneen Harts office for an appointment.

## 2012-12-15 NOTE — Progress Notes (Signed)
  Subjective:    Patient ID: Michael Hartman, male    DOB: 1956/04/10, 57 y.o.   MRN: [id number]  HPI This 57 y.o. male presents for evaluation of difficulty sleeping. He is accompanied by his wife.  Since 9 am on 12/11/2012, has only slept 3 hours. He contacted his PCP and neurologist, neither of which could see him this week.  1 hour of sleep Saturday (12/11/2012) night. "I woke up and my mind would not shut down." Sunday (12/12/2012) had a "slight migraine."  Took "my meds for that" and slept for 2 hours and HA went away.  Thoughts bombarding him, felt anxious and overwhelmed.  "Couldn't turn it off."  Out of work x 5 months s/p elbow surgery. Scheduled to go back to work tonight nights, but had to call out due to lack of sleep. Likes his work, and has been looking forward to returning.  Step-son, in prison, comes up for parole in the next few months.  They will provide telephone interviews in support of his release, but have no idea when they will be scheduled-sometime in August or September.  His wife states that she's feeling stressed about it, as she is every year, but not moreso than usual.  Remeron not working as well as it used to (patient takes 3 15 mg tabs) for sleep-prescribed by Dr. Vela Prose, as sleep deprivation is a trigger for HA.  Past medical history, surgical history, family history, social history and problem list reviewed. Patient Active Problem List   Diagnosis Date Noted  . Diabetes mellitus type 2 in obese 03/23/2012  . HYPERCHOLESTEROLEMIA 08/02/2007  . OBSTRUCTIVE SLEEP APNEA 08/02/2007  . HYPERTENSION 08/02/2007  . CORONARY ARTERY DISEASE 08/02/2007  . RHINITIS, CHRONIC 08/02/2007     Review of Systems As above. Denies chest pain, shortness of breath, HA, dizziness, vision change, nausea, vomiting, diarrhea, constipation, melena, hematochezia, dysuria, increased urinary urgency or frequency, increased hunger or thirst, unintentional weight change, unexplained myalgias  or arthralgias, rash.     Objective:   Physical Exam Blood pressure 130/90, pulse 100, temperature 98.3 F (36.8 C), resp. rate 16, height 6' 1.5" (1.867 m), weight 244 lb (110.678 kg), SpO2 98.00%. Body mass index is 31.75 kg/(m^2). Well-developed, well nourished WM who is awake, alert and oriented, in NAD. HEENT: [place]/AT, sclera and conjunctiva are clear.   Neck: supple, non-tender, no lymphadenopathy, thyromegaly. Heart: RRR, no murmur Lungs: normal effort, CTA Extremities: no cyanosis, clubbing or edema. Skin: warm and dry without rash. Psychologic: good mood and appropriate affect, normal speech and behavior.        Assessment & Plan:  Insomnia - Plan: hydrOXYzine (ATARAX/VISTARIL) 25 MG tablet Counseled regarding identifying stressors that may be contributing to his feelings of anxiety and insomnia.  If symptoms persist, he'll follow up with [name] or Dr. Vela Prose.  Fernande Bras, PA-C Physician Assistant-Certified [facility]

## 2013-03-10 ENCOUNTER — Encounter: Payer: Self-pay | Admitting: Cardiology

## 2013-03-25 ENCOUNTER — Ambulatory Visit (INDEPENDENT_AMBULATORY_CARE_PROVIDER_SITE_OTHER): Payer: 59 | Admitting: Cardiology

## 2013-03-25 ENCOUNTER — Encounter: Payer: Self-pay | Admitting: Cardiology

## 2013-03-25 VITALS — BP 130/71 | HR 68 | Ht 74.0 in | Wt 248.0 lb

## 2013-03-25 DIAGNOSIS — E119 Type 2 diabetes mellitus without complications: Secondary | ICD-10-CM

## 2013-03-25 DIAGNOSIS — E669 Obesity, unspecified: Secondary | ICD-10-CM

## 2013-03-25 DIAGNOSIS — E7800 Pure hypercholesterolemia, unspecified: Secondary | ICD-10-CM

## 2013-03-25 DIAGNOSIS — I2510 Atherosclerotic heart disease of native coronary artery without angina pectoris: Secondary | ICD-10-CM

## 2013-03-25 DIAGNOSIS — E1169 Type 2 diabetes mellitus with other specified complication: Secondary | ICD-10-CM

## 2013-03-25 DIAGNOSIS — I10 Essential (primary) hypertension: Secondary | ICD-10-CM

## 2013-03-25 MED ORDER — NITROGLYCERIN 0.4 MG SL SUBL: 0.4000 mg | SUBLINGUAL_TABLET | SUBLINGUAL | Status: DC | PRN

## 2013-03-25 NOTE — Patient Instructions (Signed)
Continue your current therapy  Get daily aerobic exercise.  I will see you in one year

## 2013-03-25 NOTE — Progress Notes (Signed)
[name] Date of Birth: 07-27-1955 Medical Record #[id number]  History of Present Illness: Michael Hartman is seen today for yearly followup. He has a history of coronary disease with remote stenting of the right coronary in 1998. He has a history of diabetes, dyslipidemia, and hypertension. He has done well from a cardiac standpoint. He denies any chest pain, shortness of breath, or palpitations. He has lost 12 pounds over the past year. He reports his last A1c was 6.3%. He has been exercising regularly. He did develop an upper respiratory infection this past week. He states he took early retirement in October.  Current Outpatient Prescriptions on File Prior to Visit  Medication Sig Dispense Refill  . aspirin 325 MG tablet Take 325 mg by mouth daily.        Marland Kitchen atorvastatin (LIPITOR) 80 MG tablet Take 1 tablet (80 mg total) by mouth daily.  90 tablet  0  . hydrOXYzine (ATARAX/VISTARIL) 25 MG tablet Take 1-3 tablets (25-75 mg total) by mouth at bedtime as needed (insomnia).  30 tablet  0  . metFORMIN (GLUCOPHAGE) 500 MG tablet Take 500 mg by mouth 2 (two) times daily.        . methocarbamol (ROBAXIN) 500 MG tablet Take 1,000 mg by mouth. Take as needed (unsure of dosage)      . metoprolol (LOPRESSOR) 50 MG tablet Take 1 tablet (50 mg total) by mouth 2 (two) times daily.  180 tablet  3  . mirtazapine (REMERON) 15 MG tablet Take 2 to 3 tabs per night as needed.       . Multiple Vitamin (MULTIVITAMIN) capsule Take 1 capsule by mouth daily.        Marland Kitchen oxyCODONE-acetaminophen (PERCOCET) 10-325 MG per tablet Take 1 tablet by mouth 3 (three) times daily.        . TRADJENTA 5 MG TABS tablet 5 mg daily.       . Valerian 450 MG CAPS Take by mouth as directed.       No current facility-administered medications on file prior to visit.    Allergies  Allergen Reactions  . Ambien [Zolpidem Tartrate]     Amnesia, odd behavior  . Amoxicillin   . Penicillins     Child hood    Past Medical History  Diagnosis  Date  . OSA (obstructive sleep apnea)   . Chronic rhinitis   . Hypercholesteremia   . Hypertension   . Coronary artery disease   . GERD (gastroesophageal reflux disease)   . Celiac disease   . History of migraine headaches   . Allergy   . Arthritis   . Diabetes mellitus without complication     Past Surgical History  Procedure Laterality Date  . Cardiac catheterization  06/06/1997    EF 65%  . Coronary stent placement    . Nasal sinus surgery    . Cardiovascular stress test  12/07/2007    EF 58%  . Eye surgery    . Spine surgery    . Shoulder acromioplasty    . Knee arthroplasty    . Ankle arthroplasty    . Elbow arthroplasty      History  Smoking status  . Former Smoker -- 4.00 packs/day for 20 years  . Quit date: 06/09/1985  Smokeless tobacco  . Former Neurosurgeon    History  Alcohol Use No    Family History  Problem Relation Age of Onset  . Hypertension Mother   . Stroke Father   . Hypertension  Father   . Hypertension Brother     Review of Systems: As noted in history of present illness..  All other systems were reviewed and are negative.  Physical Exam: BP 130/71  Pulse 68  Ht 6\' 2"  (1.88 m)  Wt 248 lb (112.492 kg)  BMI 31.83 kg/m2 He is an obese white male in no acute distress HEENT exam is unremarkable.  The carotids are 2+ without bruits.  There is no thyromegaly.  There is no JVD.  The lungs are clear.   The heart exam reveals a regular rate with a normal S1 and S2.  There are no murmurs, gallops, or rubs.  The PMI is not displaced.   Abdominal exam reveals good bowel sounds.  There is no guarding or rebound.  There is no hepatosplenomegaly or tenderness.  There are no masses.  Exam of the legs reveal no clubbing, cyanosis, or edema.   The distal pulses are intact.  Cranial nerves II - XII are intact.  Motor and sensory functions are intact.  The gait is normal.  LABORATORY DATA: ECG today demonstrates normal sinus rhythm with a rate of 66 beats per  minute. It is normal.  Assessment / Plan: 1. Coronary disease with remote stenting of the right coronary. He is asymptomatic. His last stress test was in 2009 and was normal. I am encouraged him to increase his aerobic activity. He will continue on his current therapy including amlodipine, aspirin, metoprolol, and statin therapy. I will followup in one year.  2. Hypertension continue therapy with amlodipine, metoprolol, HCTZ, and Benicar.  3. Hyperlipidemia.  Continue Lipitor.  4. Diabetes mellitus type 2. Well controlled.

## 2013-04-14 ENCOUNTER — Other Ambulatory Visit: Payer: Self-pay

## 2014-02-07 ENCOUNTER — Ambulatory Visit (INDEPENDENT_AMBULATORY_CARE_PROVIDER_SITE_OTHER): Payer: 59 | Admitting: Family Medicine

## 2014-02-07 VITALS — BP 128/76 | HR 93 | Temp 98.1°F | Resp 18 | Ht 73.5 in | Wt 257.4 lb

## 2014-02-07 DIAGNOSIS — E11628 Type 2 diabetes mellitus with other skin complications: Secondary | ICD-10-CM

## 2014-02-07 DIAGNOSIS — L089 Local infection of the skin and subcutaneous tissue, unspecified: Secondary | ICD-10-CM

## 2014-02-07 DIAGNOSIS — E1169 Type 2 diabetes mellitus with other specified complication: Secondary | ICD-10-CM

## 2014-02-07 MED ORDER — DOXYCYCLINE HYCLATE 100 MG PO CAPS
100.0000 mg | ORAL_CAPSULE | Freq: Two times a day (BID) | ORAL | Status: DC
Start: 2014-02-07 — End: 2014-07-19

## 2014-02-07 NOTE — Progress Notes (Signed)
**Note Michael-Identified via Obfuscation** Subjective:    Patient ID: Michael Hartman, male    DOB: [birth year], 58 y.o.   MRN: [id number]  HPI Michael Hartman is a 58-year-old male who presents with a right foot wound. He has a PMHx of diabetes. He notes that he peeled off a scab from the bottom of the foot and the foot started bleeding. Onset of symptoms was today, but he does not know when the scab started. He cannot recall any acute injury or stepping on anything- though he admits to walking around his home frequently barefoot. He does not have a history of diabetic foot infections. He notes some associated pain in the local area of the foot. He notes some chronic decreased sensation in the right toes, but is unsure if this is due to diabetic neuropathy. He notes that the blood sugars are well controlled. His recent blood glucose has been from 130-80. His last HgbA1c was about 7.2. He denies any surrounding erythema, but notes localized right second toe bleeding on the plantar surface. He denies any foot or toe swelling. She denies any fevers, chills, myalgias, or fatigue. He does endorse some localized decreased sensation the area of the first and second toes on the right compared to left.  His tetanus vaccine status is UTD.  Allergies: Penicillins Past Medical History  Diagnosis Date  . OSA (obstructive sleep apnea)   . Chronic rhinitis   . Hypercholesteremia   . Hypertension   . Coronary artery disease   . GERD (gastroesophageal reflux disease)   . Celiac disease   . History of migraine headaches   . Allergy   . Arthritis   . Diabetes mellitus without complication    History   Social History  . Marital Status: Married    Spouse Name: Joyce Gross    Number of Children: 0  . Years of Education: N/A   Occupational History  . [employer]ENTION OFFICER    Social History Main Topics  . Smoking status: Former Smoker -- 4.00 packs/day for 20 years    Quit date: 06/09/1985  . Smokeless tobacco: Former Neurosurgeon  . Alcohol Use: No  . Drug Use: No    . Sexual Activity: Yes    Partners: Female    Pharmacist, hospital Protection: None   Other Topics Concern  . Not on file   Social History Narrative   Lives with his wife and their cat.    Review of Systems As per history of present illness. 11 point review of systems was performed is otherwise negative.    Objective:   Physical Exam BP 128/76  Pulse 93  Temp(Src) 98.1 F (36.7 C) (Oral)  Resp 18  Ht 6' 1.5" (1.867 m)  Wt 257 lb 6.4 oz (116.756 kg)  BMI 33.50 kg/m2  SpO2 96% General appearance: alert, cooperative and appears stated age Extremities: Full range of motion of the right foot and toes. No swelling or deformity. Pulses: 2+ and symmetric Skin: There is approximately a 1 cm oblique open area superficial wound on the plantar surface of the right second toe. There is no surrounding erythema or purulent discharge. She does only to the level of the superficial dermis and there is no tracking. There is no purulent discharge or exudate. Lymph nodes: Cervical, supraclavicular, and axillary nodes normal. Neurologic: Alert and oriented X 3, normal strength and tone. Normal symmetric reflexes. Normal coordination and gait     Assessment & Plan:  1. Diabetic Foot Infection. However, more likely superficial trauma from removal  of overlying scab. Given the patient's history of diabetes, we would like to cover him with you. Antibiotic treatment for potential diabetic foot wound infection. -He was instructed to avoid walking barefoot, and should always wear adequate footwear. -His wound was irrigated and cleansed, and a sterile dressing was applied with a topical antibiotic. -Further wound care instructions were given. -We prescribed doxycycline 100 mg twice a day x10 days for her treatment of diabetic foot infection. -He should continue to monitor his blood sugars with a target A1c less than 7. -He should followup with his primary care physician for diabetes followup and for the foot  wound within 5-7 days. -He should followup if his symptoms acutely worsen, fevers, chills, myalgias, purulent drainage from the foot, or spreading rash. -He verbalized understanding and agreement.  Discussed with Dr. Conley Rolls.  Dr. Joellyn Haff, DO Sports Medicine Fellow

## 2014-02-07 NOTE — Patient Instructions (Addendum)
Wound Care Wound care helps prevent pain and infection.  You may need a tetanus shot if:  You cannot remember when you had your last tetanus shot.  You have never had a tetanus shot.  The injury broke your skin. If you need a tetanus shot and you choose not to have one, you may get tetanus. Sickness from tetanus can be serious. HOME CARE   Only take medicine as told by your doctor.  Clean the wound daily with mild soap and water.  Change any bandages (dressings) as told by your doctor.  Put medicated cream and a bandage on the wound as told by your doctor.  Change the bandage if it gets wet, dirty, or starts to smell.  Take showers. Do not take baths, swim, or do anything that puts your wound under water.  Rest and raise (elevate) the wound until the pain and puffiness (swelling) are better.  Keep all doctor visits as told. GET HELP RIGHT AWAY IF:   Yellowish-white fluid (pus) comes from the wound.  Medicine does not lessen your pain.  There is a red streak going away from the wound.  You have a fever. MAKE SURE YOU:   Understand these instructions.  Will watch your condition.  Will get help right away if you are not doing well or get worse. Document Released: 03/04/2008 Document Revised: 08/18/2011 Document Reviewed: 09/29/2010 [facility] Patient Information 2015 [place], [place]. This information is not intended to replace advice given to you by your health care provider. Make sure you discuss any questions you have with your health care provider. Wound Care Wound care helps prevent pain and infection.  You may need a tetanus shot if:  You cannot remember when you had your last tetanus shot.  You have never had a tetanus shot.  The injury broke your skin. If you need a tetanus shot and you choose not to have one, you may get tetanus. Sickness from tetanus can be serious. HOME CARE   Only take medicine as told by your doctor.  Clean the wound daily with mild  soap and water.  Change any bandages (dressings) as told by your doctor.  Put medicated cream and a bandage on the wound as told by your doctor.  Change the bandage if it gets wet, dirty, or starts to smell.  Take showers. Do not take baths, swim, or do anything that puts your wound under water.  Rest and raise (elevate) the wound until the pain and puffiness (swelling) are better.  Keep all doctor visits as told. GET HELP RIGHT AWAY IF:   Yellowish-white fluid (pus) comes from the wound.  Medicine does not lessen your pain.  There is a red streak going away from the wound.  You have a fever. MAKE SURE YOU:   Understand these instructions.  Will watch your condition.  Will get help right away if you are not doing well or get worse. Document Released: 03/04/2008 Document Revised: 08/18/2011 Document Reviewed: 09/29/2010 [facility] Patient Information 2015 [place], [place]. This information is not intended to replace advice given to you by your health care provider. Make sure you discuss any questions you have with your health care provider.

## 2014-07-19 ENCOUNTER — Ambulatory Visit (INDEPENDENT_AMBULATORY_CARE_PROVIDER_SITE_OTHER): Payer: 59 | Admitting: Emergency Medicine

## 2014-07-19 ENCOUNTER — Ambulatory Visit (INDEPENDENT_AMBULATORY_CARE_PROVIDER_SITE_OTHER): Payer: 59

## 2014-07-19 VITALS — BP 110/70 | HR 71 | Temp 98.7°F | Resp 16 | Ht 73.0 in | Wt 258.0 lb

## 2014-07-19 DIAGNOSIS — R05 Cough: Secondary | ICD-10-CM

## 2014-07-19 DIAGNOSIS — J029 Acute pharyngitis, unspecified: Secondary | ICD-10-CM

## 2014-07-19 DIAGNOSIS — R059 Cough, unspecified: Secondary | ICD-10-CM

## 2014-07-19 DIAGNOSIS — R0981 Nasal congestion: Secondary | ICD-10-CM

## 2014-07-19 DIAGNOSIS — R5383 Other fatigue: Secondary | ICD-10-CM

## 2014-07-19 LAB — POCT RAPID STREP A (OFFICE): Rapid Strep A Screen: NEGATIVE

## 2014-07-19 MED ORDER — AZITHROMYCIN 250 MG PO TABS: ORAL_TABLET | ORAL | Status: DC

## 2014-07-19 MED ORDER — HYDROCODONE-HOMATROPINE 5-1.5 MG/5ML PO SYRP: 5.0000 mL | ORAL_SOLUTION | Freq: Three times a day (TID) | ORAL | Status: DC | PRN

## 2014-07-19 NOTE — Patient Instructions (Signed)

## 2014-07-19 NOTE — Progress Notes (Signed)
   Subjective:    Patient ID: Michael Hartman, male    DOB: February 19, 1956, 59 y.o.   MRN: [id number] This chart was scribed for Lesle ChrisSteven Latica Hohmann, MD by Jolene Provostobert Halas, Medical Scribe. This patient was seen in Room 3 and the patient's care was started at 9:09 AM.  Chief Complaint  Patient presents with  . Sore Throat    x 1 month  . Cough    HPI HPI Comments: Michael LundGlenn W Zuleta is a 59 y.o. male with a hx of HTN, CAD, OSA, HLD, and DM who [facility] of an intermittent sore throat that started three weeks ago. Pt endorses mild associated productive cough. Pt denies fever. Pt states he has done salt water and vinegar gargles, and taking ibuprofen at home.     Review of Systems  Constitutional: Negative for fever and chills.  HENT: Positive for congestion and sore throat.   Respiratory: Positive for cough.   Neurological: Negative for headaches.       Objective:   Physical Exam  Constitutional: He is oriented to person, place, and time. He appears well-developed and well-nourished.  Not ill appearing.   HENT:  Head: Normocephalic and atraumatic.  Minimal redness of the posterior pharynx.  Eyes: Pupils are equal, round, and reactive to light.  Neck: Neck supple.  Cardiovascular: Normal rate, regular rhythm and normal heart sounds.   Pulmonary/Chest: Effort normal and breath sounds normal. No respiratory distress. He has no wheezes.  Neurological: He is alert and oriented to person, place, and time.  Skin: Skin is warm and dry.  Psychiatric: He has a normal mood and affect. His behavior is normal.  Nursing note and vitals reviewed.  [facility] reading (PRIMARY) by  Dr. Cleta Albertsaub. Pt has patchy posterior infiltrate seen on lateral x-ray. Please comment on right infrahilar area.     Assessment & Plan:  Patient placed on Hycodan at night. He will take a Z-Pak. There is a mild posterior infiltrate seen best on lateral.. Will cover this with Z-Pak.I personally performed the services  described in this documentation, which was scribed in my presence. The recorded information has been reviewed and is accurate.

## 2014-08-21 ENCOUNTER — Ambulatory Visit (INDEPENDENT_AMBULATORY_CARE_PROVIDER_SITE_OTHER): Payer: 59

## 2014-08-21 ENCOUNTER — Ambulatory Visit (INDEPENDENT_AMBULATORY_CARE_PROVIDER_SITE_OTHER): Payer: 59 | Admitting: Podiatry

## 2014-08-21 DIAGNOSIS — M79672 Pain in left foot: Secondary | ICD-10-CM | POA: Diagnosis not present

## 2014-08-21 DIAGNOSIS — M79671 Pain in right foot: Secondary | ICD-10-CM

## 2014-08-21 DIAGNOSIS — M779 Enthesopathy, unspecified: Secondary | ICD-10-CM

## 2014-08-21 MED ORDER — TRIAMCINOLONE ACETONIDE 10 MG/ML IJ SUSP
10.0000 mg | Freq: Once | INTRAMUSCULAR | Status: AC
  Administered 2014-08-21: 10 mg

## 2014-08-21 NOTE — Progress Notes (Signed)
   Subjective:    Patient ID: Michael Hartman, male    DOB: 09-Jun-1956, 59 y.o.   MRN: [id number]  HPI  Pt presents with bilateral pain and numbness plantar region at MPJ, pain progresses with prolonged walking and standing, has tried otc orthtotics with no relief  Review of Systems  Musculoskeletal: Positive for back pain.  All other systems reviewed and are negative.      Objective:   Physical Exam        Assessment & Plan:

## 2014-08-22 NOTE — Progress Notes (Signed)
Subjective:     Patient ID: Michael Hartman, male   DOB: [birth year], 59 y.o.   MRN: [id number]  HPI patient states I have a lot of pain in both feet and states it hurts a lot on the bottom up in the toe area and that he's had just chronic discomfort over the years. States it's worsened over the last 6 months   Review of Systems  All other systems reviewed and are negative.      Objective:   Physical Exam  Constitutional: He is oriented to person, place, and time.  Cardiovascular: Intact distal pulses.   Musculoskeletal: Normal range of motion.  Neurological: He is oriented to person, place, and time.  Skin: Skin is warm.  Nursing note and vitals reviewed.  neurovascular status is intact with muscle strength adequate and range of motion of the subtalar midtarsal joint within normal limits. Patient is quite a bit of plantar pain around the peroneal insertion and plantar to this and the lateral fascial band bilateral and is noted to have moderate discomfort in the metatarsal phalangeal joints both feet. Patient's digits are well-perfused she is well oriented 3 and had adequate hair growth     Assessment:     Probable tendinitis with possibility for mild idiopathic neuropathy and forefoot inflammatory metatarsalgia    Plan:     H&P and x-rays were discussed with patient. Today I injected the plantar tendon group around the peroneal and plantar with 3 mg Kenalog 5 mg Xylocaine and scanned for custom orthotics to try to reduce plantar stresses on the feet. Patient was placed on anti-inflammatory and we seen back to recheck in the next several weeks

## 2014-08-23 DIAGNOSIS — R52 Pain, unspecified: Secondary | ICD-10-CM

## 2014-09-12 ENCOUNTER — Ambulatory Visit: Payer: 59 | Admitting: *Deleted

## 2014-09-12 DIAGNOSIS — M779 Enthesopathy, unspecified: Secondary | ICD-10-CM

## 2014-09-12 NOTE — Patient Instructions (Signed)

## 2014-09-12 NOTE — Progress Notes (Signed)
Patient ID: Michael Hartman, male   DOB: 10-13-1955, 59 y.o.   MRN: [id number] PICKING UP INSERTS

## 2014-12-04 ENCOUNTER — Other Ambulatory Visit: Payer: Self-pay

## 2014-12-14 ENCOUNTER — Telehealth: Payer: Self-pay | Admitting: Internal Medicine

## 2014-12-14 NOTE — Telephone Encounter (Signed)
Spoke with pt. He is needing a new CPAP mask. Advised him that since he has not been seen in over 4 years we could not send in a prescription. Even if we did his insurance will more than likely not cover this. He stated for us not to worry about it, he will keep appointment in October. Nothing further was needed.

## 2014-12-18 ENCOUNTER — Ambulatory Visit (INDEPENDENT_AMBULATORY_CARE_PROVIDER_SITE_OTHER): Payer: 59

## 2014-12-18 ENCOUNTER — Ambulatory Visit (INDEPENDENT_AMBULATORY_CARE_PROVIDER_SITE_OTHER): Payer: 59 | Admitting: Podiatry

## 2014-12-18 DIAGNOSIS — M779 Enthesopathy, unspecified: Secondary | ICD-10-CM | POA: Diagnosis not present

## 2014-12-18 MED ORDER — TRIAMCINOLONE ACETONIDE 10 MG/ML IJ SUSP
10.0000 mg | Freq: Once | INTRAMUSCULAR | Status: AC
Start: 2014-12-18 — End: 2014-12-18
  Administered 2014-12-18: 10 mg

## 2014-12-19 NOTE — Progress Notes (Signed)
Subjective:     Patient ID: Michael Hartman, male   DOB: 09/07/1955, 59 y.o.   MRN: [id number]  HPI patient presents with pain in the outside of the right foot with inflammation and fluid buildup with no history of injury   Review of Systems     Objective:   Physical Exam Neurovascular status intact muscle strength adequate with inflammation at the base of the fifth metatarsal right at the insertion of the tendon    Assessment:     Peroneal tendinitis secondary to probable gait change    Plan:     H&P and x-rays reviewed. Today I went ahead and I did a careful sheath injection 3 Milligan Kenalog 5 mg Xylocaine usage and discussed the utilization of a fascial brace ice therapy and oral anti-inflammatory. Reappoint if symptoms were to persist

## 2014-12-28 ENCOUNTER — Other Ambulatory Visit: Payer: Self-pay | Admitting: Gastroenterology

## 2015-03-29 ENCOUNTER — Institutional Professional Consult (permissible substitution): Payer: Self-pay | Admitting: Internal Medicine

## 2015-05-02 ENCOUNTER — Ambulatory Visit (INDEPENDENT_AMBULATORY_CARE_PROVIDER_SITE_OTHER): Payer: 59 | Admitting: Cardiology

## 2015-05-02 ENCOUNTER — Encounter: Payer: Self-pay | Admitting: Cardiology

## 2015-05-02 VITALS — BP 130/80 | HR 66 | Ht 74.0 in | Wt 260.1 lb

## 2015-05-02 DIAGNOSIS — I10 Essential (primary) hypertension: Secondary | ICD-10-CM

## 2015-05-02 DIAGNOSIS — I2510 Atherosclerotic heart disease of native coronary artery without angina pectoris: Secondary | ICD-10-CM

## 2015-05-02 DIAGNOSIS — E119 Type 2 diabetes mellitus without complications: Secondary | ICD-10-CM

## 2015-05-02 DIAGNOSIS — E669 Obesity, unspecified: Secondary | ICD-10-CM

## 2015-05-02 DIAGNOSIS — E1169 Type 2 diabetes mellitus with other specified complication: Secondary | ICD-10-CM

## 2015-05-02 DIAGNOSIS — E7800 Pure hypercholesterolemia, unspecified: Secondary | ICD-10-CM

## 2015-05-02 NOTE — Progress Notes (Signed)
Michael LundGlenn W Hartman Date of Birth: 08-Oct-1955 Medical Record #[id number]  History of Present Illness: Michael Hartman is seen today for  followup CAD. He has a history of coronary disease with remote stenting of the right coronary in 1998. Last Myoview in 2009. Not seen for 2 years. He has a history of diabetes, dyslipidemia, and hypertension.  On follow up he notes he is now retired. He is very sedentary- only does yard work when needed.  He denies any chest pain, shortness of breath, or palpitations.  He reports his last A1c was 7%.   Current Outpatient Prescriptions on File Prior to Visit  Medication Sig Dispense Refill  . aspirin 325 MG tablet Take 325 mg by mouth daily.      Marland Kitchen. atorvastatin (LIPITOR) 80 MG tablet Take 1 tablet (80 mg total) by mouth daily. 90 tablet 0  . Canagliflozin (INVOKANA) 300 MG TABS Take by mouth. 1 tab daily    . metFORMIN (GLUCOPHAGE) 500 MG tablet Take 500 mg by mouth 2 (two) times daily.      . methocarbamol (ROBAXIN) 500 MG tablet Take 1,000 mg by mouth. Take as needed (unsure of dosage)    . metoprolol (LOPRESSOR) 50 MG tablet Take 1 tablet (50 mg total) by mouth 2 (two) times daily. 180 tablet 3  . Multiple Vitamin (MULTIVITAMIN) capsule Take 1 capsule by mouth daily.      . nitroGLYCERIN (NITROSTAT) 0.4 MG SL tablet Place 1 tablet (0.4 mg total) under the tongue every 5 (five) minutes as needed for chest pain. 25 tablet 6  . Olmesartan-Amlodipine-HCTZ (TRIBENZOR) 40-5-25 MG TABS Take by mouth.    . oxyCODONE-acetaminophen (PERCOCET) 10-325 MG per tablet Take 1 tablet by mouth 3 (three) times daily.      . TRADJENTA 5 MG TABS tablet 5 mg daily.      No current facility-administered medications on file prior to visit.    Allergies  Allergen Reactions  . Ambien [Zolpidem Tartrate]     Amnesia, odd behavior  . Amoxicillin   . Penicillins     Child hood    Past Medical History  Diagnosis Date  . OSA (obstructive sleep apnea)   . Chronic rhinitis   .  Hypercholesteremia   . Hypertension   . Coronary artery disease   . GERD (gastroesophageal reflux disease)   . Celiac disease   . History of migraine headaches   . Allergy   . Arthritis   . Diabetes mellitus without complication [facility](HCC)     Past Surgical History  Procedure Laterality Date  . Cardiac catheterization  06/06/1997    EF 65%  . Coronary stent placement    . Nasal sinus surgery    . Cardiovascular stress test  12/07/2007    EF 58%  . Eye surgery    . Spine surgery    . Shoulder acromioplasty    . Knee arthroplasty    . Ankle arthroplasty    . Elbow arthroplasty      History  Smoking status  . Former Smoker -- 4.00 packs/day for 20 years  . Quit date: 06/09/1985  Smokeless tobacco  . Former NeurosurgeonUser    History  Alcohol Use No    Family History  Problem Relation Age of Onset  . Hypertension Mother   . Stroke Father   . Hypertension Father   . Hypertension Brother     Review of Systems: As noted in history of present illness..  All other systems were reviewed and  are negative.  Physical Exam: BP 130/80 mmHg  Pulse 66  Ht  (1.88 m)  Wt 117.964 kg (260 lb 1 oz)  BMI 33.38 kg/m2 He is an obese white male in no acute distress HEENT exam is unremarkable.  The carotids are 2+ without bruits.  There is no thyromegaly.  There is no JVD.  The lungs are clear.   The heart exam reveals a regular rate with a normal S1 and S2.  There are no murmurs, gallops, or rubs.  The PMI is not displaced.   Abdominal exam reveals good bowel sounds.  There is no guarding or rebound.  There is no hepatosplenomegaly or tenderness.  There are no masses.  Exam of the legs reveal no clubbing, cyanosis, or edema.   The distal pulses are intact.  Cranial nerves II - XII are intact.  Motor and sensory functions are intact.  The gait is normal.  LABORATORY DATA: ECG today demonstrates normal sinus rhythm with a rate of 66 beats per minute. It is normal. I have personally reviewed and  interpreted this study.   Assessment / Plan: 1. Coronary disease with remote stenting of the right coronary. He is asymptomatic. His last stress test was in 2009 and was normal. I am encouraged him to increase his aerobic activity. He will continue on his current therapy including amlodipine, aspirin, metoprolol, and statin therapy. I have recommended a stress myoview. He is interested in starting a martial arts program.   2. Hypertension continue therapy with amlodipine, metoprolol, HCTZ, and Benicar.  3. Hyperlipidemia.  Continue Lipitor. Requested labs from primary care.   4. Diabetes mellitus type 2. Well controlled.

## 2015-05-02 NOTE — Patient Instructions (Signed)
We will schedule you for a stress Myoview  I will see you in one year.

## 2015-05-08 ENCOUNTER — Telehealth (HOSPITAL_COMMUNITY): Payer: Self-pay

## 2015-05-08 NOTE — Telephone Encounter (Signed)
Encounter complete. 

## 2015-05-10 ENCOUNTER — Ambulatory Visit (HOSPITAL_COMMUNITY)
Admission: RE | Admit: 2015-05-10 | Discharge: 2015-05-10 | Disposition: A | Discharge status: Home / Self Care | Payer: 59 | Source: Ambulatory Visit | Attending: Cardiovascular Disease | Admitting: Cardiovascular Disease

## 2015-05-10 DIAGNOSIS — E669 Obesity, unspecified: Secondary | ICD-10-CM | POA: Diagnosis not present

## 2015-05-10 DIAGNOSIS — Z87891 Personal history of nicotine dependence: Secondary | ICD-10-CM | POA: Diagnosis not present

## 2015-05-10 DIAGNOSIS — Z8249 Family history of ischemic heart disease and other diseases of the circulatory system: Secondary | ICD-10-CM | POA: Insufficient documentation

## 2015-05-10 DIAGNOSIS — E119 Type 2 diabetes mellitus without complications: Secondary | ICD-10-CM | POA: Insufficient documentation

## 2015-05-10 DIAGNOSIS — Z6833 Body mass index (BMI) 33.0-33.9, adult: Secondary | ICD-10-CM | POA: Insufficient documentation

## 2015-05-10 DIAGNOSIS — E7800 Pure hypercholesterolemia, unspecified: Secondary | ICD-10-CM | POA: Diagnosis not present

## 2015-05-10 DIAGNOSIS — I10 Essential (primary) hypertension: Secondary | ICD-10-CM | POA: Insufficient documentation

## 2015-05-10 DIAGNOSIS — I2510 Atherosclerotic heart disease of native coronary artery without angina pectoris: Secondary | ICD-10-CM | POA: Diagnosis not present

## 2015-05-10 DIAGNOSIS — G4733 Obstructive sleep apnea (adult) (pediatric): Secondary | ICD-10-CM | POA: Diagnosis not present

## 2015-05-10 DIAGNOSIS — E1169 Type 2 diabetes mellitus with other specified complication: Secondary | ICD-10-CM

## 2015-05-10 LAB — MYOCARDIAL PERFUSION IMAGING
Estimated workload: 7 METS
Exercise duration (min): 6 min
Exercise duration (sec): 30 s
LV dias vol: 113 mL
LV sys vol: 50 mL
MPHR: 159 {beats}/min
Peak HR: 144 {beats}/min
Percent HR: 90 %
RPE: 17
Rest HR: 87 {beats}/min
SDS: 0
SRS: 3
SSS: 3
TID: 1.05

## 2015-05-10 MED ORDER — TECHNETIUM TC 99M SESTAMIBI GENERIC - CARDIOLITE
30.3000 | Freq: Once | INTRAVENOUS | Status: AC | PRN
  Administered 2015-05-10: 30.3 via INTRAVENOUS

## 2015-05-10 MED ORDER — TECHNETIUM TC 99M SESTAMIBI GENERIC - CARDIOLITE
10.7000 | Freq: Once | INTRAVENOUS | Status: AC | PRN
  Administered 2015-05-10: 10.7 via INTRAVENOUS

## 2015-12-10 ENCOUNTER — Ambulatory Visit (INDEPENDENT_AMBULATORY_CARE_PROVIDER_SITE_OTHER): Payer: 59 | Admitting: Physician Assistant

## 2015-12-10 VITALS — BP 128/68 | HR 68 | Temp 98.3°F | Resp 16 | Ht 74.0 in | Wt 263.0 lb

## 2015-12-10 DIAGNOSIS — L03012 Cellulitis of left finger: Secondary | ICD-10-CM

## 2015-12-10 MED ORDER — DOXYCYCLINE HYCLATE 100 MG PO CAPS: 100.0000 mg | ORAL_CAPSULE | Freq: Two times a day (BID) | ORAL | Status: AC

## 2015-12-10 NOTE — Patient Instructions (Addendum)
     IF you received an x-ray today, you will receive an invoice from [facility]. Please contact [facility] at [phone] with questions or concerns regarding your invoice.   IF you received labwork today, you will receive an invoice from [facility]. Please contact [facility] at [phone] with questions or concerns regarding your invoice.   Our billing staff will not be able to assist you with questions regarding bills from these companies.  You will be contacted with the lab results as soon as they are available. The fastest way to get your results is to activate your My Chart account. Instructions are located on the last page of this paperwork. If you have not heard from us regarding the results in 2 weeks, please contact this office.    Fingertip Infection When an infection is around the nail, it is called a paronychia. When it appears over the tip of the finger, it is called a felon. These infections are due to minor injuries or cracks in the skin. If they are not treated properly, they can lead to bone infection and permanent damage to the fingernail. Incision and drainage is necessary if a pus pocket (an abscess) has formed. Antibiotics and pain medicine may also be needed. Keep your hand elevated for the next 2-3 days to reduce swelling and pain. If a pack was placed in the abscess, it should be removed in 1-2 days by your caregiver. Soak the finger in warm water for 20 minutes 4 times daily to help promote drainage. Keep the hands as dry as possible. Wear protective gloves with cotton liners. See your caregiver for follow-up care as recommended.  HOME CARE INSTRUCTIONS   Keep wound clean, dry and dressed as suggested by your caregiver.  Soak in warm salt water for fifteen minutes, four times per day for bacterial infections.  Your caregiver will prescribe an antibiotic if a bacterial infection is suspected. Take antibiotics as directed  and finish the prescription, even if the problem appears to be improving before the medicine is gone.  Only take over-the-counter or prescription medicines for pain, discomfort, or fever as directed by your caregiver. SEEK IMMEDIATE MEDICAL CARE IF:  There is redness, swelling, or increasing pain in the wound.  Pus or any other unusual drainage is coming from the wound.  An unexplained oral temperature above 102 F (38.9 C) develops.  You notice a foul smell coming from the wound or dressing. MAKE SURE YOU:   Understand these instructions.  Monitor your condition.  Contact your caregiver if you are getting worse or not improving.   This information is not intended to replace advice given to you by your health care provider. Make sure you discuss any questions you have with your health care provider.   Document Released: 07/03/2004 Document Revised: 08/18/2011 Document Reviewed: 11/13/2014 Elsevier Interactive Patient Education [employer]2016 Elsevier Inc. f

## 2015-12-10 NOTE — Progress Notes (Signed)
Subjective:    Patient ID: Michael Hartman, male    DOB: 1956-02-20, 60 y.o.   MRN: [id number]  Chief Complaint  Patient presents with  . Infected finger    x 2 weeks , left middle finger    HPI  Patient is a 60 yo male who presents today with complaints of intermittent pain on the RIGHT lateral side of his middle LEFT finger nail x several months.  It has been getting infected and resolving for last several months and progressively worsened over the last 2 weeks.    Admits to "squeezing white pus" out of the nail base occasionally and the skin has become harden and thickened.  States "it feels like my nail is growing out the side.  Denies biting fingernails, trauma, fever, vomiting, headaches and effects on other nails.  Has taken ibuprofen and percocet, prescribed for back pain chronic, with little relief.  Applied neosporin topically with no relief.   Review of Systems All others negative except those listed in HPI.  Patient Active Problem List   Diagnosis Date Noted  . Diabetes mellitus type 2 in obese (HCC) 03/23/2012  . HYPERCHOLESTEROLEMIA 08/02/2007  . OBSTRUCTIVE SLEEP APNEA 08/02/2007  . Essential hypertension 08/02/2007  . Coronary atherosclerosis 08/02/2007  . RHINITIS, CHRONIC 08/02/2007    Current Outpatient Prescriptions on File Prior to Visit  Medication Sig Dispense Refill  . aspirin 325 MG tablet Take 325 mg by mouth daily.      Marland Kitchen. atorvastatin (LIPITOR) 80 MG tablet Take 1 tablet (80 mg total) by mouth daily. 90 tablet 0  . Canagliflozin (INVOKANA) 300 MG TABS Take by mouth. 1 tab daily    . metFORMIN (GLUCOPHAGE) 500 MG tablet Take 500 mg by mouth 2 (two) times daily.      . methocarbamol (ROBAXIN) 500 MG tablet Take 1,000 mg by mouth. Take as needed (unsure of dosage)    . metoprolol (LOPRESSOR) 50 MG tablet Take 1 tablet (50 mg total) by mouth 2 (two) times daily. 180 tablet 3  . mirtazapine (REMERON) 45 MG tablet Take 1 tablet by mouth at bedtime. Take  1 tab daily at bedtime    . morphine (KADIAN) 50 MG 24 hr capsule Take 1 capsule by mouth every 12 (twelve) hours. Take 1 tab every 12 hours  0  . Multiple Vitamin (MULTIVITAMIN) capsule Take 1 capsule by mouth daily.      . nitroGLYCERIN (NITROSTAT) 0.4 MG SL tablet Place 1 tablet (0.4 mg total) under the tongue every 5 (five) minutes as needed for chest pain. 25 tablet 6  . Olmesartan-Amlodipine-HCTZ (TRIBENZOR) 40-5-25 MG TABS Take by mouth.    . ONE TOUCH ULTRA TEST test strip 1 strip by Other route as needed. Use 1 strip to check glucose daily  4  . oxyCODONE-acetaminophen (PERCOCET) 10-325 MG per tablet Take 1 tablet by mouth 3 (three) times daily.      . TRADJENTA 5 MG TABS tablet 5 mg daily.      No current facility-administered medications on file prior to visit.    Allergies  Allergen Reactions  . Ambien [Zolpidem Tartrate]     Amnesia, odd behavior  . Amoxicillin   . Penicillins     Child hood       Objective:  BP 128/68 mmHg  Pulse 68  Temp(Src) 98.3 F (36.8 C) (Oral)  Resp 16  Ht 6\' 2"  (1.88 m)  Wt 263 lb (119.296 kg)  BMI 33.75 kg/m2  SpO2 95%  Physical Exam  Constitutional: He is oriented to person, place, and time. He appears well-developed and well-nourished.  HENT:  Head: Normocephalic and atraumatic.  Eyes: Conjunctivae are normal. Pupils are equal, round, and reactive to light.  Cardiovascular: Normal rate, regular rhythm, normal heart sounds and intact distal pulses.   Pulmonary/Chest: Effort normal and breath sounds normal. No respiratory distress.  Neurological: He is alert and oriented to person, place, and time.  Skin: Skin is warm and dry.  Erythema and edema along proximal and RIGHT lateral nail plate of 3rd digit on LEFT hand.  Minimal amount white discharge expelled with palpation.    Psychiatric: He has a normal mood and affect. His behavior is normal. Judgment and thought content normal.          Assessment & Plan:  1. Paronychia of  finger, left - doxycycline (VIBRAMYCIN) 100 MG capsule; Take 1 capsule (100 mg total) by mouth 2 (two) times daily.  Dispense: 20 capsule; Refill: 0  Provided education for patient about fingertip infections and home care instructions.  Prescribed doxycycline and suggested patient take with food to avoid GI upset.  Warned patient about increased sensitivity to sun and recommended use of sun screen and protective clothing while outdoors.   Patient told to return if the infection worsens or does not resolve with treatment.  Matthan Sledge P. Roxanne Orner, PA-S

## 2015-12-10 NOTE — Progress Notes (Signed)
Patient ID: Michael Hartman, male    DOB: 11/01/1955, 60 y.o.   MRN: [id number]  PCP: Gaspar Garbeichard W [name], MD  Subjective:   Chief Complaint  Patient presents with  . Infected finger    x 2 weeks , left middle finger    HPI Presents for evaluation of infection of the LEFT middle finger x 2 weeks.  This has been intermittent for the past couple of months, but has resolved without treatment until now. Painful, swollen, a little bit reddened. Some crusting along the edge of the nail. No red streaking. No fever, chills.  Reminds him of when he had an ingrown toenail.    Review of Systems As above.    Patient Active Problem List   Diagnosis Date Noted  . Diabetes mellitus type 2 in obese (HCC) 03/23/2012  . HYPERCHOLESTEROLEMIA 08/02/2007  . OBSTRUCTIVE SLEEP APNEA 08/02/2007  . Essential hypertension 08/02/2007  . Coronary atherosclerosis 08/02/2007  . RHINITIS, CHRONIC 08/02/2007     Prior to Admission medications   Medication Sig Start Date End Date Taking? Authorizing Provider  aspirin 325 MG tablet Take 325 mg by mouth daily.     Yes Historical Provider, MD  atorvastatin (LIPITOR) 80 MG tablet Take 1 tablet (80 mg total) by mouth daily. 04/23/12  Yes Peter M SwazilandJordan, MD  Canagliflozin [facility](INVOKANA) 300 MG TABS Take by mouth. 1 tab daily   Yes Historical Provider, MD  metFORMIN (GLUCOPHAGE) 500 MG tablet Take 500 mg by mouth 2 (two) times daily.     Yes Historical Provider, MD  methocarbamol (ROBAXIN) 500 MG tablet Take 1,000 mg by mouth. Take as needed (unsure of dosage)   Yes Historical Provider, MD  metoprolol (LOPRESSOR) 50 MG tablet Take 1 tablet (50 mg total) by mouth 2 (two) times daily. 04/27/12  Yes Peter M SwazilandJordan, MD  mirtazapine (REMERON) 45 MG tablet Take 1 tablet by mouth at bedtime. Take 1 tab daily at bedtime 04/03/15  Yes Historical Provider, MD  morphine (KADIAN) 50 MG 24 hr capsule Take 1 capsule by mouth every 12 (twelve) hours. Take 1 tab every 12 hours  04/05/15  Yes Historical Provider, MD  Multiple Vitamin (MULTIVITAMIN) capsule Take 1 capsule by mouth daily.     Yes Historical Provider, MD  nitroGLYCERIN (NITROSTAT) 0.4 MG SL tablet Place 1 tablet (0.4 mg total) under the tongue every 5 (five) minutes as needed for chest pain. 03/25/13  Yes Peter M SwazilandJordan, MD  Olmesartan-Amlodipine-HCTZ [facility](TRIBENZOR) 40-5-25 MG TABS Take by mouth.   Yes Historical Provider, MD  ONE TOUCH ULTRA TEST test strip 1 strip by Other route as needed. Use 1 strip to check glucose daily 02/06/15  Yes Historical Provider, MD  oxyCODONE-acetaminophen (PERCOCET) 10-325 MG per tablet Take 1 tablet by mouth 3 (three) times daily.     Yes Historical Provider, MD  TRADJENTA 5 MG TABS tablet 5 mg daily.  03/03/12  Yes Historical Provider, MD     Allergies  Allergen Reactions  . Ambien [Zolpidem Tartrate]     Amnesia, odd behavior  . Amoxicillin   . Penicillins     Child hood       Objective:  Physical Exam  Constitutional: He is oriented to person, place, and time. He appears well-developed and well-nourished. He is active and cooperative. No distress.  BP 128/68 mmHg  Pulse 68  Temp(Src) 98.3 F (36.8 C) (Oral)  Resp 16  Ht 6\' 2"  (1.88 m)  Wt 263 lb (119.296 kg)  BMI 33.75 kg/m2  SpO2 95%   Eyes: Conjunctivae are normal.  Pulmonary/Chest: Effort normal.  Neurological: He is alert and oriented to person, place, and time.  Skin:  Nails are trimmed quite short, and all fingers with hyperkeratosis skin at the distal nail folds. The radial aspect of the middle finger nail fold noted with crusting, erythema and mild edema.   Psychiatric: He has a normal mood and affect. His speech is normal and behavior is normal.           Assessment & Plan:   1. Paronychia of finger, left Likely traumatic, due to "hang nail." Supportive care. Anticipatory guidance. Doxycycline. Antibiotic ointment if desired. - doxycycline (VIBRAMYCIN) 100 MG capsule; Take 1 capsule (100  mg total) by mouth 2 (two) times daily.  Dispense: 20 capsule; Refill: 0   Fernande Brashelle S. Bauer Ausborn, PA-C Physician Assistant-Certified [facility]

## 2016-07-20 IMAGING — CR DG CHEST 2V
3 series · 3 of 3 positions shown · non-contrast
Comparison: PA and lateral chest x-ray September 20, 2011

CLINICAL DATA: Cough, pharyngitis, and fatigue for 3 weeks

EXAM:
CHEST  2 VIEW

[PA (1 of 2)]
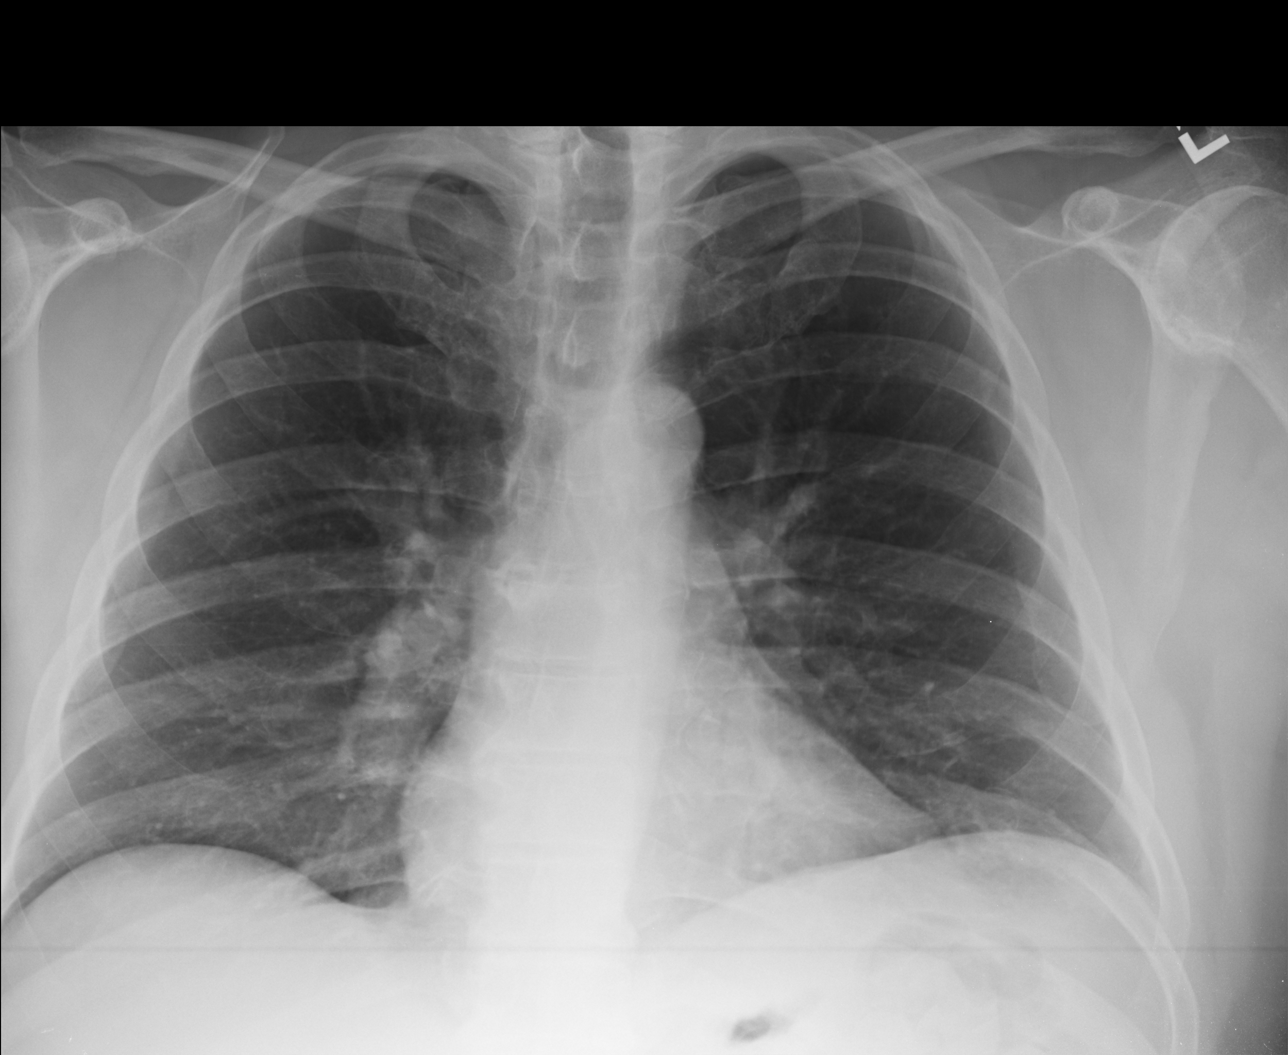

[lateral]
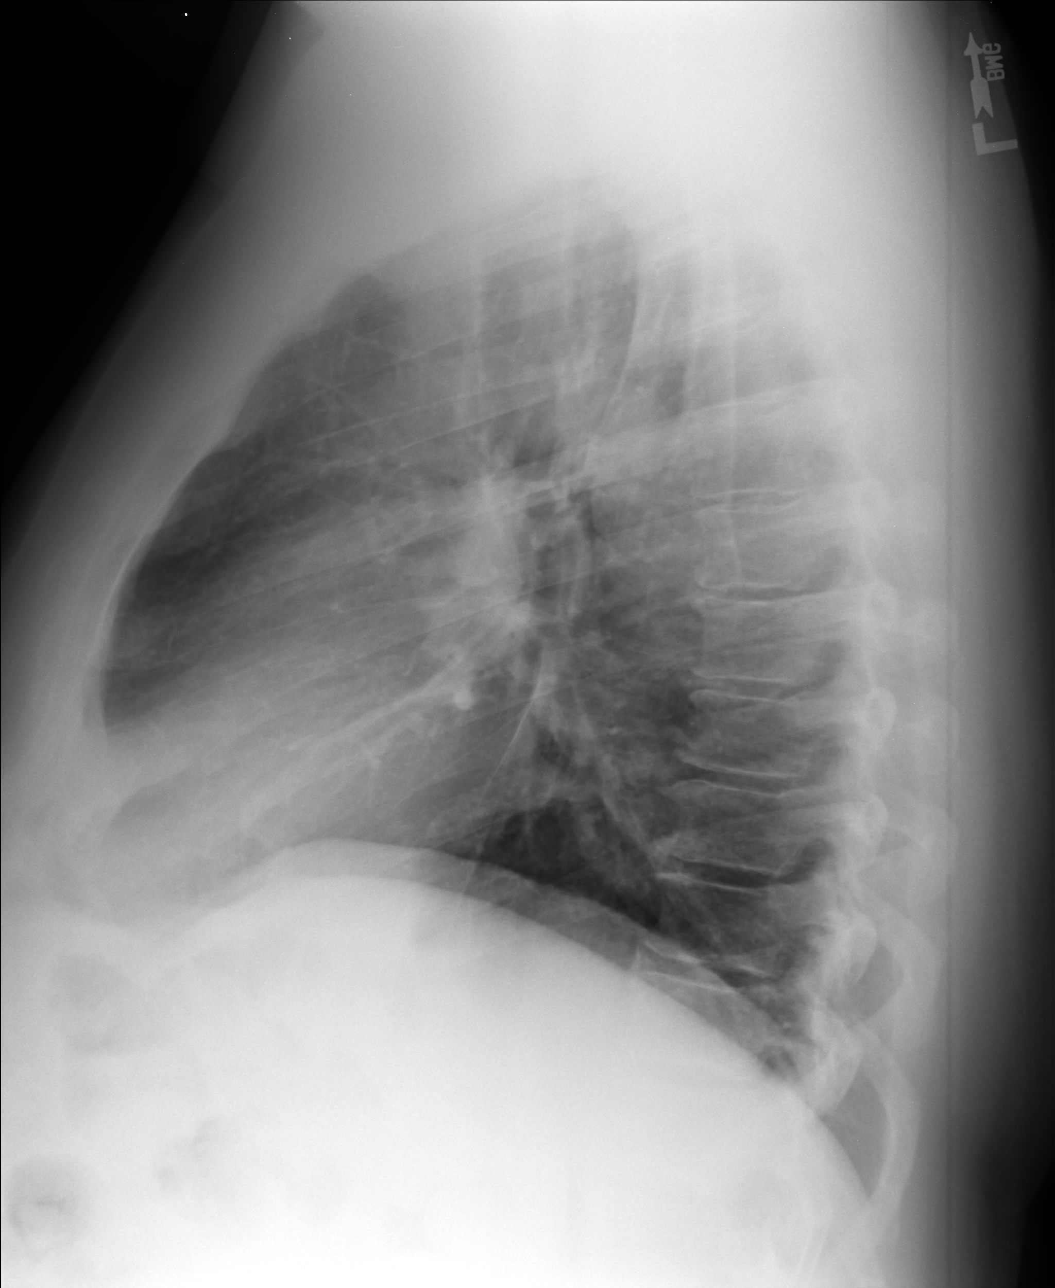

[PA (2 of 2)]
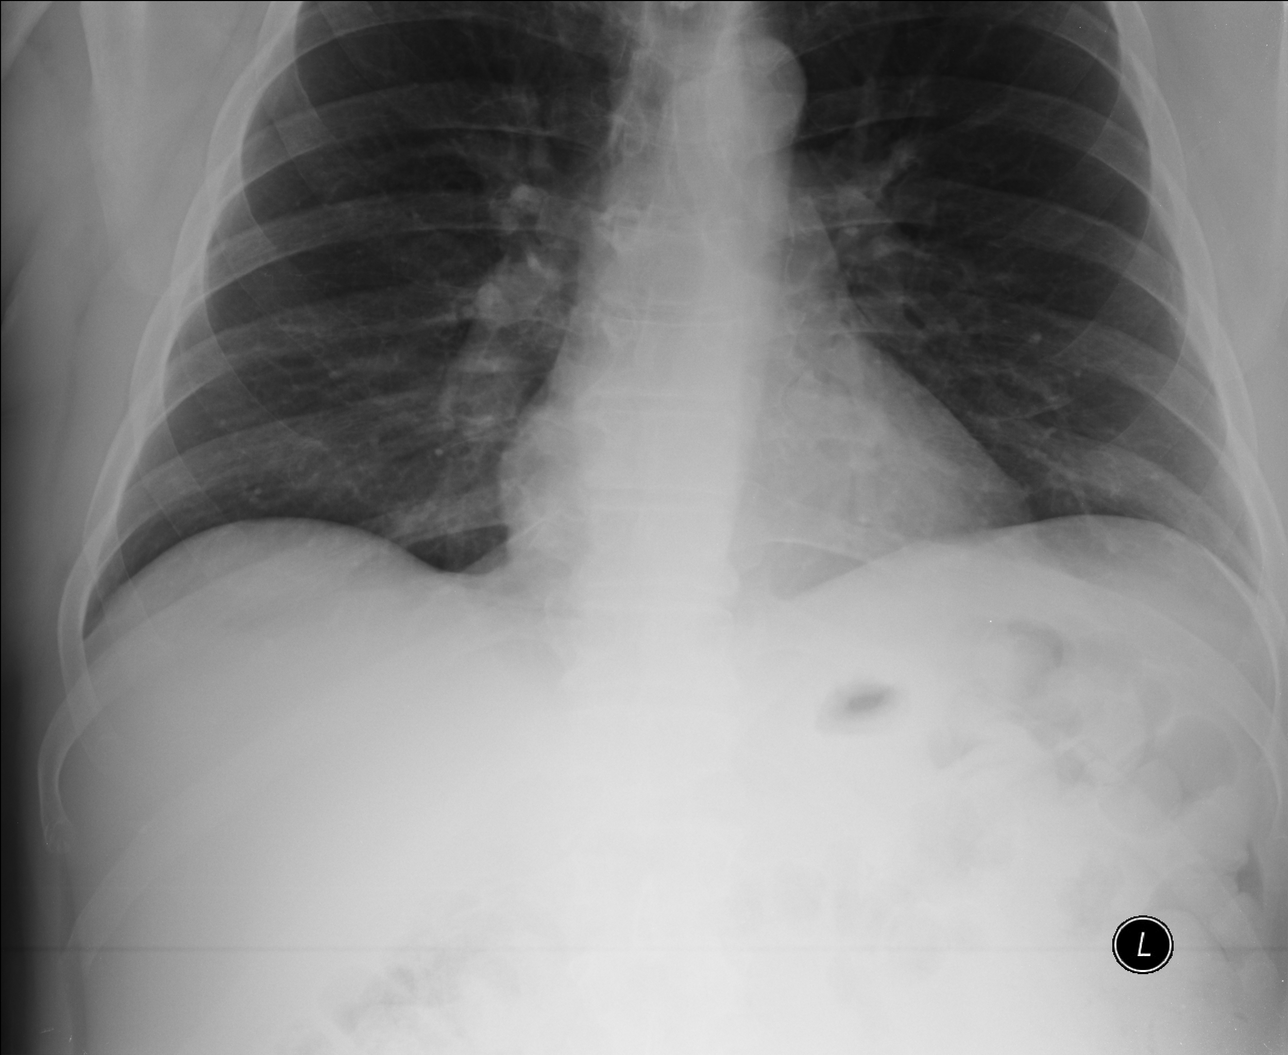

[3 of 3 positions shown; findings below may reference images not displayed]

FINDINGS: The lungs are adequately inflated. There is no focal infiltrate.
There are mildly increased interstitial markings in the infrahilar
regions which are stable allowing for differences in radiographic
technique. There is no pleural effusion. The heart and pulmonary
vascularity are normal. The trachea is midline. The bony thorax is
unremarkable.
IMPRESSION: There is no active cardiopulmonary disease.

## 2016-11-12 ENCOUNTER — Telehealth: Payer: Self-pay | Admitting: Cardiology

## 2016-11-12 DIAGNOSIS — I2510 Atherosclerotic heart disease of native coronary artery without angina pectoris: Secondary | ICD-10-CM

## 2016-11-12 DIAGNOSIS — I10 Essential (primary) hypertension: Secondary | ICD-10-CM

## 2016-11-12 NOTE — Telephone Encounter (Signed)
New message    Pain management Pt having problem with AFIB Dr Jarold MottoPatterson office ([facility]) is to fax over notes, they have questions on how to handle his medication, patient is very tired

## 2016-11-12 NOTE — Telephone Encounter (Signed)
Received call from patient-patient reports he went to the pain management doctor and they put the "thing" on his finger and "there was a problem".  They sent him to his PCP yesterday and they did EKG and showed he was in Atrial Fib.   Reports they started him on Eliquis BID.   Reports feeling more fatigued tham usual for a few days, denies palpitations, SOB, CP.  Reports last night HF was 104.  Reports compliance with medications.  Educated on importance of eliquis.   Patient wondering if he needs to follow up sooner and if he should continue ASA daily if he is on Eliquis.   Advised to monitor BP and HR and continue medications, will route to Dr. SwazilandJordan for further recommendations.

## 2016-11-12 NOTE — Telephone Encounter (Signed)
Continue Eliquis and metoprolol. Stop ASA. Need lab work from Dr. Eloise HarmanPaterson. We will see when we can work him in to be seen.  Kahil Agner SwazilandJordan MD, [facility]

## 2016-11-13 ENCOUNTER — Other Ambulatory Visit: Payer: Self-pay

## 2016-11-13 DIAGNOSIS — I4891 Unspecified atrial fibrillation: Secondary | ICD-10-CM

## 2016-11-13 NOTE — Telephone Encounter (Signed)
Spoke to patient.Dr.Jordan's recommendations given.Patient stated Dr.Patterson did not do any lab work.Dr.Jordan advised to have fasting cbc,cmet,lipid panel,tsh.Advised we do lab in office now.Lab orders mailed to have done first of next week.Advised to keep echo appointment 6/18 at 7:30 am and keep appointment with Aris EvertsBrittany Starder 11/25/16 at 2:30 pm.

## 2016-11-18 LAB — COMPREHENSIVE METABOLIC PANEL
ALT: 24 IU/L (ref 0–44)
AST: 24 IU/L (ref 0–40)
Albumin/Globulin Ratio: 1.8 (ref 1.2–2.2)
Albumin: 4.6 g/dL (ref 3.6–4.8)
Alkaline Phosphatase: 68 IU/L (ref 39–117)
BUN/Creatinine Ratio: 22 (ref 10–24)
BUN: 23 mg/dL (ref 8–27)
Bilirubin Total: 0.3 mg/dL (ref 0.0–1.2)
CO2: 21 mmol/L (ref 20–29)
Calcium: 10.1 mg/dL (ref 8.6–10.2)
Chloride: 101 mmol/L (ref 96–106)
Creatinine, Ser: 1.04 mg/dL (ref 0.76–1.27)
GFR calc Af Amer: 88 mL/min/{1.73_m2} (ref >59)
GFR calc non Af Amer: 76 mL/min/{1.73_m2} (ref >59)
Globulin, Total: 2.5 g/dL (ref 1.5–4.5)
Glucose: 151 mg/dL — ABNORMAL HIGH (ref 65–99)
Potassium: 4.6 mmol/L (ref 3.5–5.2)
Sodium: 140 mmol/L (ref 134–144)
Total Protein: 7.1 g/dL (ref 6.0–8.5)

## 2016-11-18 LAB — CBC WITH DIFFERENTIAL/PLATELET
Basophils Absolute: 0 10*3/uL (ref 0.0–0.2)
Basos: 1 %
EOS (ABSOLUTE): 0.2 10*3/uL (ref 0.0–0.4)
Eos: 3 %
Hematocrit: 41.1 % (ref 37.5–51.0)
Hemoglobin: 13.9 g/dL (ref 13.0–17.7)
Immature Grans (Abs): 0 10*3/uL (ref 0.0–0.1)
Immature Granulocytes: 1 %
Lymphocytes Absolute: 2.1 10*3/uL (ref 0.7–3.1)
Lymphs: 36 %
MCH: 29.7 pg (ref 26.6–33.0)
MCHC: 33.8 g/dL (ref 31.5–35.7)
MCV: 88 fL (ref 79–97)
Monocytes Absolute: 0.6 10*3/uL (ref 0.1–0.9)
Monocytes: 11 %
Neutrophils Absolute: 2.8 10*3/uL (ref 1.4–7.0)
Neutrophils: 48 %
Platelets: 218 10*3/uL (ref 150–379)
RBC: 4.68 x10E6/uL (ref 4.14–5.80)
RDW: 13.5 % (ref 12.3–15.4)
WBC: 5.8 10*3/uL (ref 3.4–10.8)

## 2016-11-18 LAB — LIPID PANEL
Chol/HDL Ratio: 3.5 ratio (ref 0.0–5.0)
Cholesterol, Total: 136 mg/dL (ref 100–199)
HDL: 39 mg/dL — ABNORMAL LOW (ref >39)
LDL Calculated: 54 mg/dL (ref 0–99)
Triglycerides: 217 mg/dL — ABNORMAL HIGH (ref 0–149)
VLDL Cholesterol Cal: 43 mg/dL — ABNORMAL HIGH (ref 5–40)

## 2016-11-18 LAB — TSH: TSH: 1.47 u[IU]/mL (ref 0.450–4.500)

## 2016-11-21 ENCOUNTER — Telehealth: Payer: Self-pay | Admitting: Student

## 2016-11-21 NOTE — Telephone Encounter (Signed)
11/25/2016 Received faxed referral packet from [facility] for upcoming appointment with Randall AnBrittany Strader, MD on 11/25/2016.  [facility]

## 2016-11-23 NOTE — Progress Notes (Signed)
Cardiology Office Note    Date:  11/25/2016   ID:  Havier, Deeb [birth year], MRN [id number]  PCP:  Gaspar Garbe, MD  Cardiologist: Dr. Swaziland  Chief Complaint  Patient presents with  . Follow-up    new onset-atrial flutter    History of Present Illness:    Michael Hartman is a 61 y.o. male with past medical history of CAD (s/p remote stenting of RCA in 1998), HTN, HLD, OSA (on CPAP) and Type 2 DM who presents to the office today for evaluation of atrial flutter.  He was last examined by Dr. Swaziland in 04/2015 and reported doing well from a cardiac perspective at that time. A nuclear stress test was recommended for risk stratification which showed a preserved EF of 56% with no evidence of ischemia, overall being a low-risk study.   In the interium, he was diagnosed with atrial flutter by his PCP. Reported increased fatigue but denied any chest pain, palpitations, or dyspnea. Labs were checked and showed TSH of 1.470, normal electrolytes, creatinine 1.04, stable Hgb, total cholesterol 136, HDL 39, and LDL 54. An echocardiogram was recommended and showed a preserved EF of 55% with no WMA. LA was moderately dilated and trivial TR was noted.  In talking with the patient today, he denies any recent episodes of chest discomfort, palpitations, dyspnea on exertion, lightheadedness, dizziness, or presyncope. No orthopnea, PND, or lower extremity edema. Has noted slightly increased fatigue over the past few weeks. His wife has been keeping a check of his heart rate and blood pressure with systolic readings in the 90's to low 100's and heart rate in the 80's to 110's.   He denies any alcohol use but does consume 5-6 cans of Pepsi Max daily.   He reports good compliance with his Eliquis since being started on this on 11/11/2016. No evidence of active bleeding.  Past Medical History:  Diagnosis Date  . Allergy   . Arthritis   . Atrial flutter (HCC)    a. diagnosed in 11/2016 --> started  on Eliquis for anticoagulation  . Celiac disease   . Chronic rhinitis   . Coronary artery disease    a. s/p remote stenting of RCA in 1998  . Diabetes mellitus without complication (HCC)   . GERD (gastroesophageal reflux disease)   . History of migraine headaches   . Hypercholesteremia   . Hypertension   . OSA (obstructive sleep apnea)     Past Surgical History:  Procedure Laterality Date  . ANKLE ARTHROPLASTY    . CARDIAC CATHETERIZATION  06/06/1997   EF 65%  . CARDIOVASCULAR STRESS TEST  12/07/2007   EF 58%  . CORONARY STENT PLACEMENT    . ELBOW ARTHROPLASTY    . EYE SURGERY    . KNEE ARTHROPLASTY    . NASAL SINUS SURGERY    . SHOULDER ACROMIOPLASTY    . SPINE SURGERY      Current Medications: Outpatient Medications Prior to Visit  Medication Sig Dispense Refill  . atorvastatin (LIPITOR) 80 MG tablet Take 1 tablet (80 mg total) by mouth daily. 90 tablet 0  . Canagliflozin (INVOKANA) 300 MG TABS Take by mouth. 1 tab daily    . metFORMIN (GLUCOPHAGE) 500 MG tablet Take 500 mg by mouth 2 (two) times daily.      . methocarbamol (ROBAXIN) 500 MG tablet Take 1,000 mg by mouth 3 (three) times daily. Take as needed (unsure of dosage)    . metoprolol (LOPRESSOR) 50  MG tablet Take 1 tablet (50 mg total) by mouth 2 (two) times daily. 180 tablet 3  . mirtazapine (REMERON) 45 MG tablet Take 1 tablet by mouth at bedtime. Take 1 tab daily at bedtime    . morphine (KADIAN) 50 MG 24 hr capsule Take 1 capsule by mouth every 12 (twelve) hours. Take 1 tab every 12 hours  0  . Multiple Vitamin (MULTIVITAMIN) capsule Take 1 capsule by mouth daily.      . nitroGLYCERIN (NITROSTAT) 0.4 MG SL tablet Place 1 tablet (0.4 mg total) under the tongue every 5 (five) minutes as needed for chest pain. 25 tablet 6  . ONE TOUCH ULTRA TEST test strip 1 strip by Other route as needed. Use 1 strip to check glucose daily  4  . oxyCODONE-acetaminophen (PERCOCET) 10-325 MG per tablet Take 1 tablet by mouth 3  (three) times daily.      . TRADJENTA 5 MG TABS tablet 5 mg daily.     . Olmesartan-Amlodipine-HCTZ (TRIBENZOR) 40-5-25 MG TABS Take by mouth.     No facility-administered medications prior to visit.      Allergies:   Ambien [zolpidem tartrate]; Amoxicillin; and Penicillins   Social History   Social History  . Marital status: Married    Spouse name: Joyce Gross  . Number of children: 0  . Years of education: N/A   Occupational History  . DETENTION OFFICER [employer]   Social History Main Topics  . Smoking status: Former Smoker    Packs/day: 4.00    Years: 20.00    Quit date: 06/09/1985  . Smokeless tobacco: Former Neurosurgeon  . Alcohol use No  . Drug use: No  . Sexual activity: Yes    Partners: Female    Birth control/ protection: None   Other Topics Concern  . None   Social History Narrative   Lives with his wife and their cat.     Family History:  The patient's family history includes Hypertension in his brother, father, and mother; Stroke in his father.   Review of Systems:   Please see the history of present illness.     General:  No chills, fever, night sweats or weight changes. Positive for fatigue.  Cardiovascular:  No chest pain, dyspnea on exertion, edema, orthopnea, palpitations, paroxysmal nocturnal dyspnea. Dermatological: No rash, lesions/masses Respiratory: No cough, dyspnea Urologic: No hematuria, dysuria Abdominal:   No nausea, vomiting, diarrhea, bright red blood per rectum, melena, or hematemesis Neurologic:  No visual changes, wkns, changes in mental status. All other systems reviewed and are otherwise negative except as noted above.   Physical Exam:    VS:  BP 100/70   Pulse 84   Ht 6\' 2"  (1.88 m)   Wt 260 lb 3.2 oz (118 kg)   SpO2 92%   BMI 33.41 kg/m    General: Well developed, well nourished Caucasian male appearing in no acute distress. Head: Normocephalic, atraumatic, sclera non-icteric, no xanthomas, nares are without discharge.    Neck: No carotid bruits. JVD not elevated.  Lungs: Respirations regular and unlabored, without wheezes or rales.  Heart: Irregularly irregular. No S3 or S4.  No murmur, no rubs, or gallops appreciated. Abdomen: Soft, non-tender, non-distended with normoactive bowel sounds. No hepatomegaly. No rebound/guarding. No obvious abdominal masses. Msk:  Strength and tone appear normal for age. No joint deformities or effusions. Extremities: No clubbing or cyanosis. No lower extremity edema.  Distal pedal pulses are 2+ bilaterally. Neuro: Alert and oriented X 3.  Moves all extremities spontaneously. No focal deficits noted. Psych:  Responds to questions appropriately with a normal affect. Skin: No rashes or lesions noted  Wt Readings from Last 3 Encounters:  11/25/16 260 lb 3.2 oz (118 kg)  12/10/15 263 lb (119.3 kg)  05/10/15 260 lb (117.9 kg)      Studies/Labs Reviewed:   EKG:  EKG is ordered today.  The ekg ordered today demonstrates atrial flutter, HR 84.   Recent Labs: 11/18/2016: ALT 24; BUN 23; Creatinine, Ser 1.04; Hemoglobin 13.9; Platelets 218; Potassium 4.6; Sodium 140; TSH 1.470   Lipid Panel    Component Value Date/Time   CHOL 136 11/18/2016 1030   TRIG 217 (H) 11/18/2016 1030   HDL 39 (L) 11/18/2016 1030   CHOLHDL 3.5 11/18/2016 1030   LDLCALC 54 11/18/2016 1030    Additional studies/ records that were reviewed today include:   NST: 05/10/2015  Nuclear stress EF: 56%.  The left ventricular ejection fraction is normal (55-65%).  Blood pressure demonstrated a hypertensive response to exercise.  There was no ST segment deviation noted during stress.  No T wave inversion was noted during stress.  Defect 1: There is a medium defect of mild severity present in the basal inferoseptal and basal inferior location.  The study is normal.  This is a low risk study.   Low risk stress nuclear study with diaphragmatic attenuation artifact, but otherwise normal perfusion and  normal left ventricular regional and global systolic function.  Echocardiogram: 11/24/2016 Study Conclusions  - Left ventricle: The cavity size was mildly dilated. There was   mild focal basal hypertrophy of the septum. Systolic function was   normal. The estimated ejection fraction was 55%. Wall motion was   normal; there were no regional wall motion abnormalities. - Aortic valve: Trileaflet; mildly thickened, mildly calcified   leaflets. - Mitral valve: Calcified annulus. - Left atrium: The atrium was moderately dilated. - Right ventricle: The cavity size was mildly dilated. Wall   thickness was normal. - Pulmonic valve: There was trivial regurgitation.  Assessment:    1. New onset atrial flutter (HCC)   2. Coronary artery disease involving native coronary artery of native heart without angina pectoris   3. Essential hypertension   4. Hyperlipidemia LDL goal <70   5. OSA (obstructive sleep apnea)      Plan:   In order of problems listed above:  1. New Onset Atrial Flutter - he was evaluated by his PCP on 11/11/2016 and reported increased fatigue but denied any chest pain, palpitations, or dyspnea. EKG showed new-onset atrial flutter. - Labs showed normal TSH of 1.470, normal electrolytes, and creatinine of 1.04. Echo showed a preserved EF of 55% with no WMA. LA was moderately dilated and trivial TR was noted. - he consumes 5-6 cans of Pepsi Max per day. Was encouraged to limit his caffeine intake. Does not consume alcohol.  - he reports his fatigue has since resolved and he continues to deny any symptoms at this time. We discussed rhythm vs. rate control and in his current asymptomatic state, he prefers to be conservative with rate-control. Will continue Lopressor 50mg  BID. Will stop Olmesartan-Amlodipine-HCTZ to allow for improved BP and the initiation of Cardizem CD 180mg  daily as his wife has been recording his HR has been in the 80's to 110's at rest.  - This patients  CHA2DS2-VASc Score and unadjusted Ischemic Stroke Rate (% per year) is equal to 2.2 % stroke rate/year from a score of 2 (HTN,  Vascular). Continue Eliquis 5mg  BID for anticoagulation. Compliance with this was strongly encouraged in case DCCV is pursued in the future.   2. CAD - s/p remote stenting of RCA in 1998. - He denies any recent chest discomfort or dyspnea on exertion. - continue statin therapy along with BB. No ASA secondary to need for Eliquis.   3. HTN - BP is soft at 100/70 during today's visit. - continue Lopressor 50mg  BID and start Cardizem CD 180mg  daily. Will stop Olmesartan-Amlodipine-HCTZ in the setting of needing additional rate-control.   4. HLD - recent Lipid Panel in 11/18/2016 showed total cholesterol of 136, HDL 39, and LDL 54. At goal with LDL < 70. - continue Atorvastatin 80mg  daily.   5. OSA - continue CPAP.   Medication Adjustments/Labs and Tests Ordered: Current medicines are reviewed at length with the patient today.  Concerns regarding medicines are outlined above.  Medication changes, Labs and Tests ordered today are listed in the Patient Instructions below. Patient Instructions  Medication Instructions:  STOP OLMESARTAN-AMLODIPINE START DILTIAZEM CD 180MG  DAILY  If you need a refill on your cardiac medications before your next appointment, please call your pharmacy.  Follow-Up: Your physician wants you to follow-up in: 6-8 WEEKS WITH DR Swaziland OR [facility]   Thank you for choosing [facility] at [facility]!!      Signed, Ellsworth Lennox, [place]  11/25/2016 9:59 PM    [facility] [address] [place], [place]  [address] Phone: [phone]; Fax: [phone]  [address], [address] [place], [place] [address] Phone: [phone]

## 2016-11-24 ENCOUNTER — Other Ambulatory Visit: Payer: Self-pay

## 2016-11-24 ENCOUNTER — Ambulatory Visit (HOSPITAL_COMMUNITY): Payer: 59 | Attending: Cardiology

## 2016-11-24 DIAGNOSIS — I119 Hypertensive heart disease without heart failure: Secondary | ICD-10-CM | POA: Insufficient documentation

## 2016-11-24 DIAGNOSIS — E119 Type 2 diabetes mellitus without complications: Secondary | ICD-10-CM | POA: Diagnosis not present

## 2016-11-24 DIAGNOSIS — E785 Hyperlipidemia, unspecified: Secondary | ICD-10-CM | POA: Insufficient documentation

## 2016-11-24 DIAGNOSIS — I2510 Atherosclerotic heart disease of native coronary artery without angina pectoris: Secondary | ICD-10-CM | POA: Diagnosis not present

## 2016-11-24 DIAGNOSIS — I080 Rheumatic disorders of both mitral and aortic valves: Secondary | ICD-10-CM | POA: Insufficient documentation

## 2016-11-24 DIAGNOSIS — I4891 Unspecified atrial fibrillation: Secondary | ICD-10-CM | POA: Insufficient documentation

## 2016-11-24 LAB — ECHOCARDIOGRAM COMPLETE
Ao-asc: 33 cm
E decel time: 211 msec
FS: 26 % — AB (ref 28–44)
IVS/LV PW RATIO, ED: 1.21
LA ID, A-P, ES: 37 mm
LA diam end sys: 37 mm
LA diam index: 1.52 cm/m2
LA vol A4C: 96 ml
LA vol index: 42.2 mL/m2
LA vol: 103 mL
LV PW d: 10.7 mm — AB (ref 0.6–1.1)
LVOT SV: 57 mL
LVOT VTI: 14.9 cm
LVOT area: 3.8 cm2
LVOT diameter: 22 mm
LVOT peak grad rest: 3 mmHg
LVOT peak vel: 93.2 cm/s
MV Dec: 211
MV pk E vel: 67.6 m/s
RV sys press: 23 mmHg
Reg peak vel: 224 cm/s
TR max vel: 224 cm/s

## 2016-11-25 ENCOUNTER — Encounter: Payer: Self-pay | Admitting: Student

## 2016-11-25 ENCOUNTER — Ambulatory Visit (INDEPENDENT_AMBULATORY_CARE_PROVIDER_SITE_OTHER): Payer: 59 | Admitting: Student

## 2016-11-25 VITALS — BP 100/70 | HR 84 | Ht 74.0 in | Wt 260.2 lb

## 2016-11-25 DIAGNOSIS — I2510 Atherosclerotic heart disease of native coronary artery without angina pectoris: Secondary | ICD-10-CM

## 2016-11-25 DIAGNOSIS — I10 Essential (primary) hypertension: Secondary | ICD-10-CM

## 2016-11-25 DIAGNOSIS — I4892 Unspecified atrial flutter: Secondary | ICD-10-CM | POA: Diagnosis not present

## 2016-11-25 DIAGNOSIS — E785 Hyperlipidemia, unspecified: Secondary | ICD-10-CM

## 2016-11-25 DIAGNOSIS — G4733 Obstructive sleep apnea (adult) (pediatric): Secondary | ICD-10-CM

## 2016-11-25 MED ORDER — DILTIAZEM HCL ER COATED BEADS 180 MG PO CP24: 180.0000 mg | ORAL_CAPSULE | Freq: Every day | ORAL | 3 refills | Status: DC

## 2016-11-25 MED ORDER — APIXABAN 5 MG PO TABS: 5.0000 mg | ORAL_TABLET | Freq: Two times a day (BID) | ORAL | 5 refills | Status: DC

## 2016-11-25 MED ORDER — APIXABAN 5 MG PO TABS: 5.0000 mg | ORAL_TABLET | Freq: Two times a day (BID) | ORAL | 0 refills | Status: DC

## 2016-11-25 NOTE — Patient Instructions (Signed)
Medication Instructions:  STOP OLMESARTAN-AMLODIPINE START DILTIAZEM CD 180MG  DAILY  If you need a refill on your cardiac medications before your next appointment, please call your pharmacy.  Follow-Up: Your physician wants you to follow-up in: 6-8 WEEKS WITH DR SwazilandJORDAN OR Lowanda FosterBRITTANY [facility]   Thank you for choosing [facility] at [facility]!!

## 2017-01-04 NOTE — Progress Notes (Signed)
Cardiology Office Note    Date:  01/05/2017   ID:  Michael Hartman, DOB 02-Apr-1956, MRN [id number]  PCP:  Gaspar Garbeisovec, Richard W, MD  Cardiologist: Dr. SwazilandJordan   Chief Complaint  Patient presents with  . Follow-up    History of Present Illness:    Michael Hartman is a 61 y.o. male with past medical history of CAD (s/p stenting of RCA in 1998), PAF (on Eliquis), HTN, HLD, Type 2 DM, and OSA (on CPAP) who presents to the office today for 6-week follow-up.   He was last examined by myself on 11/25/2016 after having been diagnosed with new-onset atrial flutter by his PCP. He denied any recent chest pain, palpitations, or dyspnea at the time. Recent lab work showed no acute abnormalities and echo showed a preserved EF of 55% with no WMA. Did report consuming 5-6 cans of Pepsi Max per day. Rate vs. Rhythm control strategies were discussed with the patient and he preferred rate-control at that time and to avoid a DCCV. With his variable resting HR in the 80's to 110's, he was continued on Lopressor 50mg  BID and Olmesartan-Amlodipine was discontinued to allow for a higher BP and the initiation of Cardizem CD 180mg  daily for improved rate-control.   In talking with the patient today, he reports overall doing well since his recent office visit. Reports palpitations have improved with initiation of Cardizem. He denies any recent orthopnea, PND, lower extremity edema, chest pain, lightheadedness, dizziness, or presyncope. He does report significant fatigue, unsure if this has been exacerbated since his recent atrial fibrillation diagnosis. Of note, he does take Oxycodone and PO Morphine for chronic pain and has been on this regimen for years with no recent dose adjustment of his medications. Is interested in a DCCV as his wife has undergone the procedure in the past with success.   He reports good compliance with his Eliquis and denies missing any doses since his initial diagnosis. No evidence of hematuria,  melena, or hematochezia.   He reports good compliance with his CPAP. Still consumes 4-5 cans of caffeinated soda per day.    Past Medical History:  Diagnosis Date  . Allergy   . Arthritis   . Atrial flutter (HCC)    a. diagnosed in 11/2016 --> started on Eliquis for anticoagulation  . Celiac disease   . Chronic rhinitis   . Coronary artery disease    a. s/p remote stenting of RCA in 1998  . Diabetes mellitus without complication (HCC)   . GERD (gastroesophageal reflux disease)   . History of migraine headaches   . Hypercholesteremia   . Hypertension   . OSA (obstructive sleep apnea)     Past Surgical History:  Procedure Laterality Date  . ANKLE ARTHROPLASTY    . CARDIAC CATHETERIZATION  06/06/1997   EF 65%  . CARDIOVASCULAR STRESS TEST  12/07/2007   EF 58%  . CORONARY STENT PLACEMENT    . ELBOW ARTHROPLASTY    . EYE SURGERY    . KNEE ARTHROPLASTY    . NASAL SINUS SURGERY    . SHOULDER ACROMIOPLASTY    . SPINE SURGERY      Current Medications: Outpatient Medications Prior to Visit  Medication Sig Dispense Refill  . apixaban (ELIQUIS) 5 MG TABS tablet Take 1 tablet (5 mg total) by mouth 2 (two) times daily. 60 tablet 0  . atorvastatin (LIPITOR) 80 MG tablet Take 1 tablet (80 mg total) by mouth daily. 90 tablet 0  .  Canagliflozin (INVOKANA) 300 MG TABS Take by mouth. 1 tab daily    . diltiazem (CARDIZEM CD) 180 MG 24 hr capsule Take 1 capsule (180 mg total) by mouth daily. 90 capsule 3  . metFORMIN (GLUCOPHAGE) 500 MG tablet Take 500 mg by mouth 2 (two) times daily.      . methocarbamol (ROBAXIN) 500 MG tablet Take 1,000 mg by mouth 3 (three) times daily. Take as needed (unsure of dosage)    . metoprolol (LOPRESSOR) 50 MG tablet Take 1 tablet (50 mg total) by mouth 2 (two) times daily. 180 tablet 3  . mirtazapine (REMERON) 45 MG tablet Take 1 tablet by mouth at bedtime. Take 1 tab daily at bedtime    . morphine (KADIAN) 50 MG 24 hr capsule Take 1 capsule by mouth every  12 (twelve) hours. Take 1 tab every 12 hours  0  . Multiple Vitamin (MULTIVITAMIN) capsule Take 1 capsule by mouth daily.      . nitroGLYCERIN (NITROSTAT) 0.4 MG SL tablet Place 1 tablet (0.4 mg total) under the tongue every 5 (five) minutes as needed for chest pain. 25 tablet 6  . ONE TOUCH ULTRA TEST test strip 1 strip by Other route as needed. Use 1 strip to check glucose daily  4  . oxyCODONE-acetaminophen (PERCOCET) 10-325 MG per tablet Take 1 tablet by mouth 3 (three) times daily.      . TRADJENTA 5 MG TABS tablet 5 mg daily.      No facility-administered medications prior to visit.      Allergies:   Ambien [zolpidem tartrate]; Amoxicillin; and Penicillins   Social History   Social History  . Marital status: Married    Spouse name: Joyce Gross  . Number of children: 0  . Years of education: N/A   Occupational History  . 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 Smoking status: Former Smoker    Packs/day: 4.00    Years: 20.00    Quit date: 06/09/1985  . Smokeless tobacco: Former Neurosurgeon  . Alcohol use No  . Drug use: No  . Sexual activity: Yes    Partners: Female    Birth control/ protection: None   Other Topics Concern  . None   Social History Narrative   Lives with his wife and their cat.     Family History:  The patient's family history includes Hypertension in his brother, father, and mother; Stroke in his father.   Review of Systems:   Please see the history of present illness.     General:  No chills, fever, night sweats or weight changes. Positive for fatigue.  Cardiovascular:  No chest pain, dyspnea on exertion, edema, orthopnea, palpitations, paroxysmal nocturnal dyspnea. Dermatological: No rash, lesions/masses Respiratory: No cough, dyspnea Urologic: No hematuria, dysuria Abdominal:   No nausea, vomiting, diarrhea, bright red blood per rectum, melena, or hematemesis Neurologic:  No visual changes, wkns, changes in mental  status. All other systems reviewed and are otherwise negative except as noted above.   Physical Exam:    VS:  BP 128/82   Pulse 66   Ht 6\' 2"  (1.88 m)   Wt 258 lb (117 kg)   BMI 33.13 kg/m    General: Well developed, well nourished Caucasian male appearing in no acute distress. Head: Normocephalic, atraumatic, sclera non-icteric, no xanthomas, nares are without discharge.  Neck: No carotid bruits. JVD not elevated.  Lungs: Respirations regular and unlabored, without wheezes or rales.  Heart: Irregularly  irregulae. No S3 or S4.  No murmur, no rubs, or gallops appreciated. Abdomen: Soft, non-tender, non-distended with normoactive bowel sounds. No hepatomegaly. No rebound/guarding. No obvious abdominal masses. Msk:  Strength and tone appear normal for age. No joint deformities or effusions. Extremities: No clubbing or cyanosis. No lower extremity edema.  Distal pedal pulses are 2+ bilaterally. Neuro: Alert and oriented X 3. Moves all extremities spontaneously. No focal deficits noted. Psych:  Responds to questions appropriately with a normal affect. Skin: No rashes or lesions noted  Wt Readings from Last 3 Encounters:  01/05/17 258 lb (117 kg)  11/25/16 260 lb 3.2 oz (118 kg)  12/10/15 263 lb (119.3 kg)      Studies/Labs Reviewed:   EKG:  EKG is ordered today.  The ekg ordered today demonstrates atrial flutter, HR 88, with no acute ST or T-wave changes.   Recent Labs: 11/18/2016: ALT 24; BUN 23; Creatinine, Ser 1.04; Hemoglobin 13.9; Platelets 218; Potassium 4.6; Sodium 140; TSH 1.470   Lipid Panel    Component Value Date/Time   CHOL 136 11/18/2016 1030   TRIG 217 (H) 11/18/2016 1030   HDL 39 (L) 11/18/2016 1030   CHOLHDL 3.5 11/18/2016 1030   LDLCALC 54 11/18/2016 1030    Additional studies/ records that were reviewed today include:   Echocardiogram: 11/2016 Study Conclusions  - Left ventricle: The cavity size was mildly dilated. There was   mild focal basal  hypertrophy of the septum. Systolic function was   normal. The estimated ejection fraction was 55%. Wall motion was   normal; there were no regional wall motion abnormalities. - Aortic valve: Trileaflet; mildly thickened, mildly calcified   leaflets. - Mitral valve: Calcified annulus. - Left atrium: The atrium was moderately dilated. - Right ventricle: The cavity size was mildly dilated. Wall   thickness was normal. - Pulmonic valve: There was trivial regurgitation.  Assessment:    1. Atrial flutter with controlled response (HCC)   2. Current use of long term anticoagulation   3. Preop testing   4. Coronary artery disease involving native coronary artery of native heart without angina pectoris   5. OSA (obstructive sleep apnea)   6. Essential hypertension   7. Hyperlipidemia LDL goal <70      Plan:   In order of problems listed above:  1. Atrial Flutter/ Use of Long-term anticoagulation - the patient was initially diagnosed with atrial flutter by his PCP in 11/2016. Labs initially showed no electrolyte abnormalities and normal TSH. Echo showed a preserved EF with no significant valve abnormalities.  - he was continued on Lopressor 50mg  BID with Cardizem CD 180mg  daily being added to his medication regimen in place of Olmesartan-Amlodipine for improved rate-control at his last office visit.  - while he denies any recent palpitations or dyspnea, he has noted increased fatigue which he thinks is possibly secondary to his arrhythmia. Initially preferred rate-control, but he wishes to attempt a DCCV to see if return to NSR is possible. LA was moderatley dilated by echo last month. He reports good compliance with his Eliquis and denies missing any doses since initiation of this. No evidence of active bleeding. Risks and benefits of the procedure were reviewed with the patient. Will plan for DCCV later this week. Importance of compliance with CPAP and limiting caffeine use was strongly  encouraged.   2. CAD - s/p stenting of RCA in 1998. - he denies any recent chest pain or dyspnea on exertion - continue BB and statin therapy.  3. OSA - continued compliance with CPAP encouraged.   4. HTN - BP is well-controlled at 128/82 during today's visit. - continue current medication regimen.   5. HLD - Lipid Panel in 11/2016 showed total cholesterol of 136, HDL 39, and LDL at 54. At goal with LDL < 70. - continue Atorvastatin 80mg  daily.    Medication Adjustments/Labs and Tests Ordered: Current medicines are reviewed at length with the patient today.  Concerns regarding medicines are outlined above.  Medication changes, Labs and Tests ordered today are listed in the Patient Instructions below. Patient Instructions  Medication Instructions: Your physician recommends that you continue on your current medications as directed. Please refer to the Current Medication list given to you today.  If you need a refill on your cardiac medications before your next appointment, please call your pharmacy.   Labwork: Please have the following labs drawn today: BMET, CBC and PT/INR  Special Instructions:  A cardioversion has been scheduled for Thursday August 2nd at 10 am with Dr. Eden Emms. Please arrive at 8:30 am. Please DO NOT take your morphine or oxycodone (percocet) that morning.   Thank you for choosing [facility] at [facility]!!    Signed, Ellsworth Lennox, PA-C  01/05/2017 8:08 PM    [facility] [address] [place], [address] [place], [place]  [address] Phone: [phone]; Fax: [phone]  [address], [address] [place], [place] [address] Phone: [phone]

## 2017-01-05 ENCOUNTER — Encounter: Payer: Self-pay | Admitting: *Deleted

## 2017-01-05 ENCOUNTER — Encounter: Payer: Self-pay | Admitting: Student

## 2017-01-05 ENCOUNTER — Ambulatory Visit (INDEPENDENT_AMBULATORY_CARE_PROVIDER_SITE_OTHER): Payer: 59 | Admitting: Student

## 2017-01-05 VITALS — BP 128/82 | HR 66 | Ht 74.0 in | Wt 258.0 lb

## 2017-01-05 DIAGNOSIS — E785 Hyperlipidemia, unspecified: Secondary | ICD-10-CM

## 2017-01-05 DIAGNOSIS — G4733 Obstructive sleep apnea (adult) (pediatric): Secondary | ICD-10-CM | POA: Diagnosis not present

## 2017-01-05 DIAGNOSIS — I10 Essential (primary) hypertension: Secondary | ICD-10-CM

## 2017-01-05 DIAGNOSIS — Z7901 Long term (current) use of anticoagulants: Secondary | ICD-10-CM | POA: Insufficient documentation

## 2017-01-05 DIAGNOSIS — I2510 Atherosclerotic heart disease of native coronary artery without angina pectoris: Secondary | ICD-10-CM

## 2017-01-05 DIAGNOSIS — I483 Typical atrial flutter: Secondary | ICD-10-CM | POA: Insufficient documentation

## 2017-01-05 DIAGNOSIS — Z01818 Encounter for other preprocedural examination: Secondary | ICD-10-CM | POA: Diagnosis not present

## 2017-01-05 DIAGNOSIS — I4892 Unspecified atrial flutter: Secondary | ICD-10-CM

## 2017-01-05 NOTE — Patient Instructions (Addendum)
Medication Instructions: Your physician recommends that you continue on your current medications as directed. Please refer to the Current Medication list given to you today.  If you need a refill on your cardiac medications before your next appointment, please call your pharmacy.   Labwork: Please have the following labs drawn today: BMET, CBC and PT/INR   Special Instructions:  A cardioversion has been scheduled for Thursday August 2nd at 10 am with Dr. Eden EmmsNishan. Please arrive at 8:30 am. Please DO NOT take your morphine or oxycodone (percocet) that morning.     Thank you for choosing [facility] at [facility]!!

## 2017-01-06 LAB — BASIC METABOLIC PANEL
BUN/Creatinine Ratio: 17 (ref 10–24)
BUN: 16 mg/dL (ref 8–27)
CO2: 24 mmol/L (ref 20–29)
Calcium: 9.8 mg/dL (ref 8.6–10.2)
Chloride: 99 mmol/L (ref 96–106)
Creatinine, Ser: 0.95 mg/dL (ref 0.76–1.27)
GFR calc Af Amer: 98 mL/min/{1.73_m2} (ref >59)
GFR calc non Af Amer: 85 mL/min/{1.73_m2} (ref >59)
Glucose: 153 mg/dL — ABNORMAL HIGH (ref 65–99)
Potassium: 5.1 mmol/L (ref 3.5–5.2)
Sodium: 141 mmol/L (ref 134–144)

## 2017-01-06 LAB — CBC
Hematocrit: 43.8 % (ref 37.5–51.0)
Hemoglobin: 15.2 g/dL (ref 13.0–17.7)
MCH: 30 pg (ref 26.6–33.0)
MCHC: 34.7 g/dL (ref 31.5–35.7)
MCV: 87 fL (ref 79–97)
Platelets: 253 10*3/uL (ref 150–379)
RBC: 5.06 x10E6/uL (ref 4.14–5.80)
RDW: 13.9 % (ref 12.3–15.4)
WBC: 5.1 10*3/uL (ref 3.4–10.8)

## 2017-01-06 LAB — PROTIME-INR
INR: 1 (ref 0.8–1.2)
Prothrombin Time: 10.9 s (ref 9.1–12.0)

## 2017-01-08 ENCOUNTER — Ambulatory Visit (HOSPITAL_COMMUNITY)
Admission: RE | Admit: 2017-01-08 | Discharge: 2017-01-08 | Disposition: A | Discharge status: Home / Self Care | Payer: 59 | Source: Ambulatory Visit | Attending: Cardiovascular Disease | Admitting: Cardiovascular Disease

## 2017-01-08 ENCOUNTER — Ambulatory Visit (HOSPITAL_COMMUNITY): Payer: 59 | Admitting: Registered Nurse

## 2017-01-08 ENCOUNTER — Encounter (HOSPITAL_COMMUNITY)
Admission: RE | Disposition: A | Discharge status: Home / Self Care | Payer: Self-pay | Source: Ambulatory Visit | Attending: Cardiovascular Disease

## 2017-01-08 ENCOUNTER — Encounter (HOSPITAL_COMMUNITY): Payer: Self-pay

## 2017-01-08 DIAGNOSIS — J310 Chronic rhinitis: Secondary | ICD-10-CM | POA: Insufficient documentation

## 2017-01-08 DIAGNOSIS — I480 Paroxysmal atrial fibrillation: Secondary | ICD-10-CM | POA: Insufficient documentation

## 2017-01-08 DIAGNOSIS — Z880 Allergy status to penicillin: Secondary | ICD-10-CM | POA: Diagnosis not present

## 2017-01-08 DIAGNOSIS — I4892 Unspecified atrial flutter: Secondary | ICD-10-CM | POA: Diagnosis not present

## 2017-01-08 DIAGNOSIS — Z7901 Long term (current) use of anticoagulants: Secondary | ICD-10-CM | POA: Insufficient documentation

## 2017-01-08 DIAGNOSIS — K900 Celiac disease: Secondary | ICD-10-CM | POA: Diagnosis not present

## 2017-01-08 DIAGNOSIS — Z7984 Long term (current) use of oral hypoglycemic drugs: Secondary | ICD-10-CM | POA: Insufficient documentation

## 2017-01-08 DIAGNOSIS — Z79891 Long term (current) use of opiate analgesic: Secondary | ICD-10-CM | POA: Diagnosis not present

## 2017-01-08 DIAGNOSIS — I10 Essential (primary) hypertension: Secondary | ICD-10-CM | POA: Diagnosis not present

## 2017-01-08 DIAGNOSIS — Z87891 Personal history of nicotine dependence: Secondary | ICD-10-CM | POA: Insufficient documentation

## 2017-01-08 DIAGNOSIS — I483 Typical atrial flutter: Secondary | ICD-10-CM | POA: Diagnosis not present

## 2017-01-08 DIAGNOSIS — G8929 Other chronic pain: Secondary | ICD-10-CM | POA: Insufficient documentation

## 2017-01-08 DIAGNOSIS — G4733 Obstructive sleep apnea (adult) (pediatric): Secondary | ICD-10-CM | POA: Insufficient documentation

## 2017-01-08 DIAGNOSIS — Z955 Presence of coronary angioplasty implant and graft: Secondary | ICD-10-CM | POA: Insufficient documentation

## 2017-01-08 DIAGNOSIS — E7800 Pure hypercholesterolemia, unspecified: Secondary | ICD-10-CM | POA: Diagnosis not present

## 2017-01-08 DIAGNOSIS — K219 Gastro-esophageal reflux disease without esophagitis: Secondary | ICD-10-CM | POA: Insufficient documentation

## 2017-01-08 DIAGNOSIS — I2510 Atherosclerotic heart disease of native coronary artery without angina pectoris: Secondary | ICD-10-CM | POA: Diagnosis not present

## 2017-01-08 DIAGNOSIS — M1990 Unspecified osteoarthritis, unspecified site: Secondary | ICD-10-CM | POA: Diagnosis not present

## 2017-01-08 DIAGNOSIS — E119 Type 2 diabetes mellitus without complications: Secondary | ICD-10-CM | POA: Diagnosis not present

## 2017-01-08 HISTORY — PX: CARDIOVERSION: SHX1299

## 2017-01-08 LAB — GLUCOSE, CAPILLARY: Glucose-Capillary: 196 mg/dL — ABNORMAL HIGH (ref 65–99)

## 2017-01-08 SURGERY — CARDIOVERSION
Anesthesia: General

## 2017-01-08 MED ORDER — LIDOCAINE 2% (20 MG/ML) 5 ML SYRINGE
INTRAMUSCULAR | Status: DC | PRN
  Administered 2017-01-08: 60 mg via INTRAVENOUS

## 2017-01-08 MED ORDER — SODIUM CHLORIDE 0.9 % IV SOLN
INTRAVENOUS | Status: DC
  Administered 2017-01-08: 09:00:00 via INTRAVENOUS

## 2017-01-08 MED ORDER — PROPOFOL 10 MG/ML IV BOLUS
INTRAVENOUS | Status: DC | PRN
  Administered 2017-01-08 (×2): 100 mg via INTRAVENOUS

## 2017-01-08 NOTE — Interval H&P Note (Signed)
History and Physical Interval Note:  01/08/2017 8:23 AM  Michael Hartman  has presented today for surgery, with the diagnosis of aflutter  The various methods of treatment have been discussed with the patient and family. After consideration of risks, benefits and other options for treatment, the patient has consented to  Procedure(s): CARDIOVERSION (N/A) as a surgical intervention .  The patient's history has been reviewed, patient examined, no change in status, stable for surgery.  I have reviewed the patient's chart and labs.  Questions were answered to the patient's satisfaction.     Charlton HawsPeter Tiannah Greenly

## 2017-01-08 NOTE — Addendum Note (Signed)
Addendum  created 01/08/17 1028 by Julian ReilWelty, Kindall Swaby F, CRNA   Anesthesia Intra Flowsheets edited

## 2017-01-08 NOTE — Anesthesia Procedure Notes (Signed)
Procedure Name: General with mask airway Date/Time: 01/08/2017 9:21 AM Performed by: Julian ReilWELTY, Aracelli Woloszyn F Pre-anesthesia Checklist: Patient identified, Emergency Drugs available, Suction available, Patient being monitored and Timeout performed Patient Re-evaluated:Patient Re-evaluated prior to induction Oxygen Delivery Method: Ambu bag Preoxygenation: Pre-oxygenation with 100% oxygen

## 2017-01-08 NOTE — Transfer of Care (Signed)
Immediate Anesthesia Transfer of Care Note  Patient: Michael Hartman  Procedure(s) Performed: Procedure(s): CARDIOVERSION (N/A)  Patient Location: Endoscopy Unit  Anesthesia Type:General  Level of Consciousness: awake and patient cooperative  Airway & Oxygen Therapy: Patient Spontanous Breathing  Post-op Assessment: Report given to RN and Post -op Vital signs reviewed and stable  Post vital signs: Reviewed and stable  Last Vitals:  Vitals:   01/08/17 0826  BP: (!) 157/104  Pulse: 94  Resp: 18  Temp: 36.8 C    Last Pain:  Vitals:   01/08/17 0826  TempSrc: Oral         Complications: No apparent anesthesia complications

## 2017-01-08 NOTE — CV Procedure (Signed)
DCC: Anesthesia: Fitzgerald 200 mg propofol 60 mg lidocaine  DCC x 2 150 and 200J  Converted from afib rate 95 to NSR rate 78 No immediate neurologic sequelae  On Rx eliquis with no missed doses  Michael Hartman

## 2017-01-08 NOTE — H&P (View-Only) (Signed)
 Cardiology Office Note    Date:  01/05/2017   ID:  Michael Hartman, DOB 05/28/1956, MRN [id number]  PCP:  [name], Richard W, MD  Cardiologist: Dr. Jordan   Chief Complaint  Patient presents with  . Follow-up    History of Present Illness:    Michael Hartman is a 61 y.o. male with past medical history of CAD (s/p stenting of RCA in 1998), PAF (on Eliquis), HTN, HLD, Type 2 DM, and OSA (on CPAP) who presents to the office today for 6-week follow-up.   He was last examined by myself on 11/25/2016 after having been diagnosed with new-onset atrial flutter by his PCP. He denied any recent chest pain, palpitations, or dyspnea at the time. Recent lab work showed no acute abnormalities and echo showed a preserved EF of 55% with no WMA. Did report consuming 5-6 cans of Pepsi Max per day. Rate vs. Rhythm control strategies were discussed with the patient and he preferred rate-control at that time and to avoid a DCCV. With his variable resting HR in the 80's to 110's, he was continued on Lopressor 50mg BID and Olmesartan-Amlodipine was discontinued to allow for a higher BP and the initiation of Cardizem CD 180mg daily for improved rate-control.   In talking with the patient today, he reports overall doing well since his recent office visit. Reports palpitations have improved with initiation of Cardizem. He denies any recent orthopnea, PND, lower extremity edema, chest pain, lightheadedness, dizziness, or presyncope. He does report significant fatigue, unsure if this has been exacerbated since his recent atrial fibrillation diagnosis. Of note, he does take Oxycodone and PO Morphine for chronic pain and has been on this regimen for years with no recent dose adjustment of his medications. Is interested in a DCCV as his wife has undergone the procedure in the past with success.   He reports good compliance with his Eliquis and denies missing any doses since his initial diagnosis. No evidence of hematuria,  melena, or hematochezia.   He reports good compliance with his CPAP. Still consumes 4-5 cans of caffeinated soda per day.    Past Medical History:  Diagnosis Date  . Allergy   . Arthritis   . Atrial flutter (HCC)    a. diagnosed in 11/2016 --> started on Eliquis for anticoagulation  . Celiac disease   . Chronic rhinitis   . Coronary artery disease    a. s/p remote stenting of RCA in 1998  . Diabetes mellitus without complication (HCC)   . GERD (gastroesophageal reflux disease)   . History of migraine headaches   . Hypercholesteremia   . Hypertension   . OSA (obstructive sleep apnea)     Past Surgical History:  Procedure Laterality Date  . ANKLE ARTHROPLASTY    . CARDIAC CATHETERIZATION  06/06/1997   EF 65%  . CARDIOVASCULAR STRESS TEST  12/07/2007   EF 58%  . CORONARY STENT PLACEMENT    . ELBOW ARTHROPLASTY    . EYE SURGERY    . KNEE ARTHROPLASTY    . NASAL SINUS SURGERY    . SHOULDER ACROMIOPLASTY    . SPINE SURGERY      Current Medications: Outpatient Medications Prior to Visit  Medication Sig Dispense Refill  . apixaban (ELIQUIS) 5 MG TABS tablet Take 1 tablet (5 mg total) by mouth 2 (two) times daily. 60 tablet 0  . atorvastatin (LIPITOR) 80 MG tablet Take 1 tablet (80 mg total) by mouth daily. 90 tablet 0  .   Canagliflozin (INVOKANA) 300 MG TABS Take by mouth. 1 tab daily    . diltiazem (CARDIZEM CD) 180 MG 24 hr capsule Take 1 capsule (180 mg total) by mouth daily. 90 capsule 3  . metFORMIN (GLUCOPHAGE) 500 MG tablet Take 500 mg by mouth 2 (two) times daily.      . methocarbamol (ROBAXIN) 500 MG tablet Take 1,000 mg by mouth 3 (three) times daily. Take as needed (unsure of dosage)    . metoprolol (LOPRESSOR) 50 MG tablet Take 1 tablet (50 mg total) by mouth 2 (two) times daily. 180 tablet 3  . mirtazapine (REMERON) 45 MG tablet Take 1 tablet by mouth at bedtime. Take 1 tab daily at bedtime    . morphine (KADIAN) 50 MG 24 hr capsule Take 1 capsule by mouth every  12 (twelve) hours. Take 1 tab every 12 hours  0  . Multiple Vitamin (MULTIVITAMIN) capsule Take 1 capsule by mouth daily.      . nitroGLYCERIN (NITROSTAT) 0.4 MG SL tablet Place 1 tablet (0.4 mg total) under the tongue every 5 (five) minutes as needed for chest pain. 25 tablet 6  . ONE TOUCH ULTRA TEST test strip 1 strip by Other route as needed. Use 1 strip to check glucose daily  4  . oxyCODONE-acetaminophen (PERCOCET) 10-325 MG per tablet Take 1 tablet by mouth 3 (three) times daily.      . TRADJENTA 5 MG TABS tablet 5 mg daily.      No facility-administered medications prior to visit.      Allergies:   Ambien [zolpidem tartrate]; Amoxicillin; and Penicillins   Social History   Social History  . Marital status: Married    Spouse name: Kay  . Number of children: 0  . Years of education: N/A   Occupational History  . [employer]ION OFFICER [employer]   Social History Main Topics  . Smoking status: Former Smoker    Packs/day: 4.00    Years: 20.00    Quit date: 06/09/1985  . Smokeless tobacco: Former User  . Alcohol use No  . Drug use: No  . Sexual activity: Yes    Partners: Female    Birth control/ protection: None   Other Topics Concern  . None   Social History Narrative   Lives with his wife and their cat.     Family History:  The patient's family history includes Hypertension in his brother, father, and mother; Stroke in his father.   Review of Systems:   Please see the history of present illness.     General:  No chills, fever, night sweats or weight changes. Positive for fatigue.  Cardiovascular:  No chest pain, dyspnea on exertion, edema, orthopnea, palpitations, paroxysmal nocturnal dyspnea. Dermatological: No rash, lesions/masses Respiratory: No cough, dyspnea Urologic: No hematuria, dysuria Abdominal:   No nausea, vomiting, diarrhea, bright red blood per rectum, melena, or hematemesis Neurologic:  No visual changes, wkns, changes in mental  status. All other systems reviewed and are otherwise negative except as noted above.   Physical Exam:    VS:  BP 128/82   Pulse 66   Ht 6' 2" (1.88 m)   Wt 258 lb (117 kg)   BMI 33.13 kg/m    General: Well developed, well nourished Caucasian male appearing in no acute distress. Head: Normocephalic, atraumatic, sclera non-icteric, no xanthomas, nares are without discharge.  Neck: No carotid bruits. JVD not elevated.  Lungs: Respirations regular and unlabored, without wheezes or rales.  Heart: Irregularly   irregulae. No S3 or S4.  No murmur, no rubs, or gallops appreciated. Abdomen: Soft, non-tender, non-distended with normoactive bowel sounds. No hepatomegaly. No rebound/guarding. No obvious abdominal masses. Msk:  Strength and tone appear normal for age. No joint deformities or effusions. Extremities: No clubbing or cyanosis. No lower extremity edema.  Distal pedal pulses are 2+ bilaterally. Neuro: Alert and oriented X 3. Moves all extremities spontaneously. No focal deficits noted. Psych:  Responds to questions appropriately with a normal affect. Skin: No rashes or lesions noted  Wt Readings from Last 3 Encounters:  01/05/17 258 lb (117 kg)  11/25/16 260 lb 3.2 oz (118 kg)  12/10/15 263 lb (119.3 kg)      Studies/Labs Reviewed:   EKG:  EKG is ordered today.  The ekg ordered today demonstrates atrial flutter, HR 88, with no acute ST or T-wave changes.   Recent Labs: 11/18/2016: ALT 24; BUN 23; Creatinine, Ser 1.04; Hemoglobin 13.9; Platelets 218; Potassium 4.6; Sodium 140; TSH 1.470   Lipid Panel    Component Value Date/Time   CHOL 136 11/18/2016 1030   TRIG 217 (H) 11/18/2016 1030   HDL 39 (L) 11/18/2016 1030   CHOLHDL 3.5 11/18/2016 1030   LDLCALC 54 11/18/2016 1030    Additional studies/ records that were reviewed today include:   Echocardiogram: 11/2016 Study Conclusions  - Left ventricle: The cavity size was mildly dilated. There was   mild focal basal  hypertrophy of the septum. Systolic function was   normal. The estimated ejection fraction was 55%. Wall motion was   normal; there were no regional wall motion abnormalities. - Aortic valve: Trileaflet; mildly thickened, mildly calcified   leaflets. - Mitral valve: Calcified annulus. - Left atrium: The atrium was moderately dilated. - Right ventricle: The cavity size was mildly dilated. Wall   thickness was normal. - Pulmonic valve: There was trivial regurgitation.  Assessment:    1. Atrial flutter with controlled response (HCC)   2. Current use of long term anticoagulation   3. Preop testing   4. Coronary artery disease involving native coronary artery of native heart without angina pectoris   5. OSA (obstructive sleep apnea)   6. Essential hypertension   7. Hyperlipidemia LDL goal <70      Plan:   In order of problems listed above:  1. Atrial Flutter/ Use of Long-term anticoagulation - the patient was initially diagnosed with atrial flutter by his PCP in 11/2016. Labs initially showed no electrolyte abnormalities and normal TSH. Echo showed a preserved EF with no significant valve abnormalities.  - he was continued on Lopressor 50mg BID with Cardizem CD 180mg daily being added to his medication regimen in place of Olmesartan-Amlodipine for improved rate-control at his last office visit.  - while he denies any recent palpitations or dyspnea, he has noted increased fatigue which he thinks is possibly secondary to his arrhythmia. Initially preferred rate-control, but he wishes to attempt a DCCV to see if return to NSR is possible. LA was moderatley dilated by echo last month. He reports good compliance with his Eliquis and denies missing any doses since initiation of this. No evidence of active bleeding. Risks and benefits of the procedure were reviewed with the patient. Will plan for DCCV later this week. Importance of compliance with CPAP and limiting caffeine use was strongly  encouraged.   2. CAD - s/p stenting of RCA in 1998. - he denies any recent chest pain or dyspnea on exertion - continue BB and statin therapy.    3. OSA - continued compliance with CPAP encouraged.   4. HTN - BP is well-controlled at 128/82 during today's visit. - continue current medication regimen.   5. HLD - Lipid Panel in 11/2016 showed total cholesterol of 136, HDL 39, and LDL at 54. At goal with LDL < 70. - continue Atorvastatin 80mg daily.    Medication Adjustments/Labs and Tests Ordered: Current medicines are reviewed at length with the patient today.  Concerns regarding medicines are outlined above.  Medication changes, Labs and Tests ordered today are listed in the Patient Instructions below. Patient Instructions  Medication Instructions: Your physician recommends that you continue on your current medications as directed. Please refer to the Current Medication list given to you today.  If you need a refill on your cardiac medications before your next appointment, please call your pharmacy.   Labwork: Please have the following labs drawn today: BMET, CBC and PT/INR  Special Instructions:  A cardioversion has been scheduled for Thursday August 2nd at 10 am with Dr. Nishan. Please arrive at 8:30 am. Please DO NOT take your morphine or oxycodone (percocet) that morning.   Thank you for choosing [facility]!!    Signed, Dominika Losey M Klay Sobotka, PA-C  01/05/2017 8:08 PM    [facility] [address], [address] [place], [place]  [address] Phone: [phone]; Fax: [phone]  [address], [address] [place], [place] [address] Phone: [phone]  

## 2017-01-08 NOTE — Anesthesia Preprocedure Evaluation (Addendum)
Anesthesia Evaluation  Patient identified by MRN, date of birth, ID band Patient awake    Reviewed: Allergy & Precautions, H&P , NPO status , Patient's Chart, lab work & pertinent test results  Airway Mallampati: III  TM Distance: >3 FB Neck ROM: Full    Dental no notable dental hx. (+) Teeth Intact, Dental Advisory Given   Pulmonary sleep apnea , former smoker,    Pulmonary exam normal breath sounds clear to auscultation       Cardiovascular hypertension, Pt. on medications and Pt. on home beta blockers + CAD and + Cardiac Stents  + dysrhythmias Atrial Fibrillation  Rhythm:Irregular Rate:Normal     Neuro/Psych negative neurological ROS  negative psych ROS   GI/Hepatic Neg liver ROS, GERD  Medicated and Controlled,  Endo/Other  diabetes, Type 2, Oral Hypoglycemic Agents  Renal/GU negative Renal ROS  negative genitourinary   Musculoskeletal  (+) Arthritis , Osteoarthritis,    Abdominal   Peds  Hematology negative hematology ROS (+)   Anesthesia Other Findings   Reproductive/Obstetrics negative OB ROS                            Anesthesia Physical Anesthesia Plan  ASA: III  Anesthesia Plan: General   Post-op Pain Management:    Induction: Intravenous  PONV Risk Score and Plan: 2 and Treatment may vary due to age or medical condition  Airway Management Planned: Mask  Additional Equipment:   Intra-op Plan:   Post-operative Plan:   Informed Consent: I have reviewed the patients History and Physical, chart, labs and discussed the procedure including the risks, benefits and alternatives for the proposed anesthesia with the patient or authorized representative who has indicated his/her understanding and acceptance.   Dental advisory given  Plan Discussed with: CRNA  Anesthesia Plan Comments:         Anesthesia Quick Evaluation

## 2017-01-08 NOTE — Discharge Instructions (Signed)
Electrical Cardioversion, Care After °This sheet gives you information about how to care for yourself after your procedure. Your health care provider may also give you more specific instructions. If you have problems or questions, contact your health care provider. °What can I expect after the procedure? °After the procedure, it is common to have: °· Some redness on the skin where the shocks were given. ° °Follow these instructions at home: °· Do not drive for 24 hours if you were given a medicine to help you relax (sedative). °· Take over-the-counter and prescription medicines only as told by your health care provider. °· Ask your health care provider how to check your pulse. Check it often. °· Rest for 48 hours after the procedure or as told by your health care provider. °· Avoid or limit your caffeine use as told by your health care provider. °Contact a health care provider if: °· You feel like your heart is beating too quickly or your pulse is not regular. °· You have a serious muscle cramp that does not go away. °Get help right away if: °· You have discomfort in your chest. °· You are dizzy or you feel faint. °· You have trouble breathing or you are short of breath. °· Your speech is slurred. °· You have trouble moving an arm or leg on one side of your body. °· Your fingers or toes turn cold or blue. °This information is not intended to replace advice given to you by your health care provider. Make sure you discuss any questions you have with your health care provider. °Document Released: 03/16/2013 Document Revised: 12/28/2015 Document Reviewed: 11/30/2015 °Elsevier Interactive Patient Education © 2018 [employer] ° °

## 2017-01-08 NOTE — Anesthesia Postprocedure Evaluation (Signed)
Anesthesia Post Note  Patient: Ceasar LundGlenn W Walberg  Procedure(s) Performed: Procedure(s) (LRB): CARDIOVERSION (N/A)     Patient location during evaluation: PACU Anesthesia Type: General Level of consciousness: awake and alert Pain management: pain level controlled Vital Signs Assessment: post-procedure vital signs reviewed and stable Respiratory status: spontaneous breathing, nonlabored ventilation and respiratory function stable Cardiovascular status: blood pressure returned to baseline and stable Postop Assessment: no signs of nausea or vomiting Anesthetic complications: no    Last Vitals:  Vitals:   01/08/17 0955 01/08/17 1000  BP: (!) 154/103   Pulse: 89 95  Resp: (!) 24 18  Temp:      Last Pain:  Vitals:   01/08/17 0937  TempSrc: Oral                 Graciemae Delisle,W. EDMOND

## 2017-01-10 ENCOUNTER — Encounter (HOSPITAL_COMMUNITY): Payer: Self-pay | Admitting: Cardiovascular Disease

## 2017-01-23 ENCOUNTER — Ambulatory Visit (INDEPENDENT_AMBULATORY_CARE_PROVIDER_SITE_OTHER): Payer: 59 | Admitting: Family Medicine

## 2017-01-23 ENCOUNTER — Encounter: Payer: Self-pay | Admitting: Family Medicine

## 2017-01-23 VITALS — BP 139/88 | HR 78 | Temp 97.9°F | Resp 18 | Ht 73.03 in | Wt 254.6 lb

## 2017-01-23 DIAGNOSIS — J301 Allergic rhinitis due to pollen: Secondary | ICD-10-CM | POA: Diagnosis not present

## 2017-01-23 DIAGNOSIS — J0100 Acute maxillary sinusitis, unspecified: Secondary | ICD-10-CM

## 2017-01-23 MED ORDER — DOXYCYCLINE HYCLATE 100 MG PO CAPS: 100.0000 mg | ORAL_CAPSULE | Freq: Two times a day (BID) | ORAL | 0 refills | Status: DC

## 2017-01-23 MED ORDER — ONETOUCH ULTRA BLUE VI STRP: 1.0000 | ORAL_STRIP | 4 refills | Status: DC | PRN

## 2017-01-23 MED ORDER — LORATADINE 10 MG PO TABS: 10.0000 mg | ORAL_TABLET | Freq: Every day | ORAL | 11 refills | Status: DC

## 2017-01-23 MED ORDER — FLUTICASONE PROPIONATE 50 MCG/ACT NA SUSP: 2.0000 | Freq: Every day | NASAL | 11 refills | Status: DC

## 2017-01-23 NOTE — Patient Instructions (Addendum)
Claritin 10mg  one tablet daily for allergies. Flonase nasal spray 2 sprays into each nostril daily. Doxycycline is antibiotic for sinus infection. Nasal saline as needed.   Sinusitis, Adult Sinusitis is soreness and inflammation of your sinuses. Sinuses are hollow spaces in the bones around your face. Your sinuses are located:  Around your eyes.  In the middle of your forehead.  Behind your nose.  In your cheekbones.  Your sinuses and nasal passages are lined with a stringy fluid (mucus). Mucus normally drains out of your sinuses. When your nasal tissues become inflamed or swollen, the mucus can become trapped or blocked so air cannot flow through your sinuses. This allows bacteria, viruses, and funguses to grow, which leads to infection. Sinusitis can develop quickly and last for 7?10 days (acute) or for more than 12 weeks (chronic). Sinusitis often develops after a cold. What are the causes? This condition is caused by anything that creates swelling in the sinuses or stops mucus from draining, including:  Allergies.  Asthma.  Bacterial or viral infection.  Abnormally shaped bones between the nasal passages.  Nasal growths that contain mucus (nasal polyps).  Narrow sinus openings.  Pollutants, such as chemicals or irritants in the air.  A foreign object stuck in the nose.  A fungal infection. This is rare.  What increases the risk? The following factors may make you more likely to develop this condition:  Having allergies or asthma.  Having had a recent cold or respiratory tract infection.  Having structural deformities or blockages in your nose or sinuses.  Having a weak immune system.  Doing a lot of swimming or diving.  Overusing nasal sprays.  Smoking.  What are the signs or symptoms? The main symptoms of this condition are pain and a feeling of pressure around the affected sinuses. Other symptoms include:  Upper  toothache.  Earache.  Headache.  Bad breath.  Decreased sense of smell and taste.  A cough that may get worse at night.  Fatigue.  Fever.  Thick drainage from your nose. The drainage is often green and it may contain pus (purulent).  Stuffy nose or congestion.  Postnasal drip. This is when extra mucus collects in the throat or back of the nose.  Swelling and warmth over the affected sinuses.  Sore throat.  Sensitivity to light.  How is this diagnosed? This condition is diagnosed based on symptoms, a medical history, and a physical exam. To find out if your condition is acute or chronic, your health care provider may:  Look in your nose for signs of nasal polyps.  Tap over the affected sinus to check for signs of infection.  View the inside of your sinuses using an imaging device that has a light attached (endoscope).  If your health care provider suspects that you have chronic sinusitis, you may also:  Be tested for allergies.  Have a sample of mucus taken from your nose (nasal culture) and checked for bacteria.  Have a mucus sample examined to see if your sinusitis is related to an allergy.  If your sinusitis does not respond to treatment and it lasts longer than 8 weeks, you may have an MRI or CT scan to check your sinuses. These scans also help to determine how severe your infection is. In rare cases, a bone biopsy may be done to rule out more serious types of fungal sinus disease. How is this treated? Treatment for sinusitis depends on the cause and whether your condition is chronic or  acute. If a virus is causing your sinusitis, your symptoms will go away on their own within 10 days. You may be given medicines to relieve your symptoms, including:  Topical nasal decongestants. They shrink swollen nasal passages and let mucus drain from your sinuses.  Antihistamines. These drugs block inflammation that is triggered by allergies. This can help to ease swelling in  your nose and sinuses.  Topical nasal corticosteroids. These are nasal sprays that ease inflammation and swelling in your nose and sinuses.  Nasal saline washes. These rinses can help to get rid of thick mucus in your nose.  If your condition is caused by bacteria, you will be given an antibiotic medicine. If your condition is caused by a fungus, you will be given an antifungal medicine. Surgery may be needed to correct underlying conditions, such as narrow nasal passages. Surgery may also be needed to remove polyps. Follow these instructions at home: Medicines  Take, use, or apply over-the-counter and prescription medicines only as told by your health care provider. These may include nasal sprays.  If you were prescribed an antibiotic medicine, take it as told by your health care provider. Do not stop taking the antibiotic even if you start to feel better. Hydrate and Humidify  Drink enough water to keep your urine clear or pale yellow. Staying hydrated will help to thin your mucus.  Use a cool mist humidifier to keep the humidity level in your home above 50%.  Inhale steam for 10-15 minutes, 3-4 times a day or as told by your health care provider. You can do this in the bathroom while a hot shower is running.  Limit your exposure to cool or dry air. Rest  Rest as much as possible.  Sleep with your head raised (elevated).  Make sure to get enough sleep each night. General instructions  Apply a warm, moist washcloth to your face 3-4 times a day or as told by your health care provider. This will help with discomfort.  Wash your hands often with soap and water to reduce your exposure to viruses and other germs. If soap and water are not available, use hand sanitizer.  Do not smoke. Avoid being around people who are smoking (secondhand smoke).  Keep all follow-up visits as told by your health care provider. This is important. Contact a health care provider if:  You have a  fever.  Your symptoms get worse.  Your symptoms do not improve within 10 days. Get help right away if:  You have a severe headache.  You have persistent vomiting.  You have pain or swelling around your face or eyes.  You have vision problems.  You develop confusion.  Your neck is stiff.  You have trouble breathing. This information is not intended to replace advice given to you by your health care provider. Make sure you discuss any questions you have with your health care provider. Document Released: 05/26/2005 Document Revised: 01/20/2016 Document Reviewed: 03/21/2015 Elsevier Interactive Patient Education  2017 [employer].    IF you received an x-ray today, you will receive an invoice from [facility]. Please contact [facility] at [phone] with questions or concerns regarding your invoice.   IF you received labwork today, you will receive an invoice from [facility]. Please contact [facility] at [phone] with questions or concerns regarding your invoice.   Our billing staff will not be able to assist you with questions regarding bills from these companies.  You will be contacted with the lab results  as soon as they are available. The fastest way to get your results is to activate your My Chart account. Instructions are located on the last page of this paperwork. If you have not heard from [place] regarding the results in 2 weeks, please contact this office.

## 2017-01-23 NOTE — Progress Notes (Signed)
Subjective:    Patient ID: Michael Hartman, male    DOB: [birth year], 61 y.o.   MRN: [id number]  01/23/2017  Facial Pain (X 1 1/2 weeks) and Sinus Problem (had fever and upset stomach)   HPI This 61 y.o. male presents for evaluation of sinus congestion, PND, nasal congestion.  Onset last week.  No fever/chills/sweats.  Chronic headaches.  No migraines in one year.  No ear pain.  +sore throat.  +hoarseness.  +nasal congestion; +rhinorrhea.  Chest congestion starting.  No SOB.  R sided congestion worse.  Ears are starting to itch.  Spring and fall are bad allergy seasons.  A bit early.   Has OSA and wearing CPAP.    Retired.   Ran out of test strips.   BP Readings from Last 3 Encounters:  01/23/17 139/88  01/08/17 (!) 154/103  01/05/17 128/82   Wt Readings from Last 3 Encounters:  01/23/17 254 lb 9.6 oz (115.5 kg)  01/08/17 258 lb 6.4 oz (117.2 kg)  01/05/17 258 lb (117 kg)    There is no immunization history on file for this patient.  Review of Systems  Constitutional: Negative for activity change, appetite change, chills, diaphoresis, fatigue and fever.  HENT: Positive for congestion, ear pain, postnasal drip, rhinorrhea, sinus pain, sinus pressure, sore throat and voice change. Negative for trouble swallowing.   Respiratory: Positive for cough. Negative for shortness of breath and wheezing.   Cardiovascular: Negative for chest pain, palpitations and leg swelling.  Gastrointestinal: Negative for abdominal pain, diarrhea, nausea and vomiting.  Endocrine: Negative for cold intolerance, heat intolerance, polydipsia, polyphagia and polyuria.  Skin: Negative for color change, rash and wound.  Neurological: Negative for dizziness, tremors, seizures, syncope, facial asymmetry, speech difficulty, weakness, light-headedness, numbness and headaches.  Psychiatric/Behavioral: Negative for dysphoric mood and sleep disturbance. The patient is not nervous/anxious.     Past Medical History:    Diagnosis Date  . Allergy   . Arthritis   . Atrial flutter (HCC)    a. diagnosed in 11/2016 --> started on Eliquis for anticoagulation  . Celiac disease   . Chronic rhinitis   . Coronary artery disease    a. s/p remote stenting of RCA in 1998  . Diabetes mellitus without complication (HCC)   . GERD (gastroesophageal reflux disease)   . History of migraine headaches   . Hypercholesteremia   . Hypertension   . OSA (obstructive sleep apnea)    Past Surgical History:  Procedure Laterality Date  . ANKLE ARTHROPLASTY    . CARDIAC CATHETERIZATION  06/06/1997   EF 65%  . CARDIOVASCULAR STRESS TEST  12/07/2007   EF 58%  . CARDIOVERSION N/A 01/08/2017   Procedure: CARDIOVERSION;  Surgeon: Wendall Stade, MD;  Location: [facility] ENDOSCOPY;  Service: Cardiovascular;  Laterality: N/A;  . CORONARY STENT PLACEMENT    . ELBOW ARTHROPLASTY    . EYE SURGERY    . KNEE ARTHROPLASTY    . NASAL SINUS SURGERY    . SHOULDER ACROMIOPLASTY    . SPINE SURGERY     Allergies  Allergen Reactions  . Ambien [Zolpidem Tartrate]     Amnesia, odd behavior  . Amoxicillin   . Penicillins     Child hood    Social History   Social History  . Marital status: Married    Spouse name: Joyce Gross  . Number of children: 0  . Years of education: N/A   Occupational History  . DETENTION OFFICER [employer]  Social History Main Topics  . Smoking status: Former Smoker    Packs/day: 4.00    Years: 20.00    Quit date: 06/09/1985  . Smokeless tobacco: Former Neurosurgeon  . Alcohol use No  . Drug use: No  . Sexual activity: Yes    Partners: Female    Birth control/ protection: None   Other Topics Concern  . Not on file   Social History Narrative   Lives with his wife and their cat.   Family History  Problem Relation Age of Onset  . Hypertension Mother   . Diabetes Mother   . Stroke Father   . Hypertension Father   . Hypertension Brother        Objective:    BP 139/88 (BP Location: Right Arm,  Patient Position: Sitting, Cuff Size: Large)   Pulse 78   Temp 97.9 F (36.6 C) (Oral)   Resp 18   Ht 6' 1.03" (1.855 m)   Wt 254 lb 9.6 oz (115.5 kg)   SpO2 95%   BMI 33.56 kg/m  Physical Exam  Constitutional: He is oriented to person, place, and time. He appears well-developed and well-nourished. No distress.  HENT:  Head: Normocephalic and atraumatic.  Right Ear: Tympanic membrane, external ear and ear canal normal.  Left Ear: Tympanic membrane, external ear and ear canal normal.  Nose: Mucosal edema and rhinorrhea present. Right sinus exhibits maxillary sinus tenderness. Right sinus exhibits no frontal sinus tenderness. Left sinus exhibits maxillary sinus tenderness. Left sinus exhibits no frontal sinus tenderness.  Mouth/Throat: Oropharynx is clear and moist.  Eyes: Pupils are equal, round, and reactive to light. Conjunctivae and EOM are normal.  Neck: Normal range of motion. Neck supple. Carotid bruit is not present. No thyromegaly present.  Cardiovascular: Normal rate, regular rhythm, normal heart sounds and intact distal pulses.  Exam reveals no gallop and no friction rub.   No murmur heard. Pulmonary/Chest: Effort normal and breath sounds normal. He has no wheezes. He has no rales.  Abdominal: Soft. Bowel sounds are normal. He exhibits no distension and no mass. There is no tenderness. There is no rebound and no guarding.  Lymphadenopathy:    He has no cervical adenopathy.  Neurological: He is alert and oriented to person, place, and time. No cranial nerve deficit.  Skin: Skin is warm and dry. No rash noted. He is not diaphoretic.  Psychiatric: He has a normal mood and affect. His behavior is normal.  Nursing note and vitals reviewed.  Results for orders placed or performed during the hospital encounter of 01/08/17  Glucose, capillary  Result Value Ref Range   Glucose-Capillary 196 (H) 65 - 99 mg/dL   No results found. Depression screen [facility] 2/9 01/23/2017 12/10/2015    Decreased Interest 0 0  Down, Depressed, Hopeless 0 0  PHQ - 2 Score 0 0   Fall Risk  01/23/2017 12/10/2015  Falls in the past year? No No        Assessment & Plan:   1. Acute non-recurrent maxillary sinusitis   2. Seasonal allergic rhinitis due to pollen    New onset.  Treat as outlined. RTC for acute worsening.  No orders of the defined types were placed in this encounter.  Meds ordered this encounter  Medications  . ONE TOUCH ULTRA TEST test strip    Sig: 1 each by Other route as needed. Use 1 strip to check glucose daily    Dispense:  100 each    Refill:  4  . doxycycline (VIBRAMYCIN) 100 MG capsule    Sig: Take 1 capsule (100 mg total) by mouth 2 (two) times daily.    Dispense:  20 capsule    Refill:  0  . fluticasone (FLONASE) 50 MCG/ACT nasal spray    Sig: Place 2 sprays into both nostrils daily.    Dispense:  16 g    Refill:  11  . loratadine (CLARITIN) 10 MG tablet    Sig: Take 1 tablet (10 mg total) by mouth daily.    Dispense:  30 tablet    Refill:  11    No Follow-up on file.   Dametri Ozburn Paulita Fujita, M.D. Primary [facility] at [facility] previously [facility] [address] [place], [place]  [phone] phone [phone] fax

## 2017-01-27 NOTE — Progress Notes (Signed)
Cardiology Office Note    Date:  01/28/2017   ID:  Michael, Hartman [birth year], MRN [id number]  PCP:  Michael Garbe, MD  Cardiologist: Dr. Swaziland   Chief Complaint  Patient presents with  . Follow-up    s/p DCCV    History of Present Illness:    Michael Hartman is a 61 y.o. male with past medical history of CAD (s/p stenting of RCA in 1998), PAF (on Eliquis), HTN, HLD, Type 2 DM, and OSA (on CPAP) who presents to the office today for follow-up of his recent DCCV.   He was examined by myself on 01/05/2017 and reported improvement of his atrial fibrillation with recent dose adjustment of his Cardizem. While he denied any palpitations or dyspnea on exertion he had noted significant fatigue since being diagnosed with atrial fibrillation back in 11/2016. He reported good compliance with Eliquis and denied missing any recent doses therefore a DCCV was recommended for hopeful return to NSR. He presented to [facility] on 01/08/2017 for the procedure and underwent DCCV x2 with 150J and 200J with conversion to NSR.   In talking with the patient today, he reports doing well since his recent cardioversion. He has experienced significant improvement in his fatigue. He denies any palpitations, chest discomfort, dyspnea on exertion, orthopnea, PND, or lower extremity edema.  He reports good compliance with his medication regimen and denies missing any doses of Eliquis. No recent melena, hematochezia, or hematuria. Has been checking his blood pressure and heart rate regularly with systolic numbers in the 110's to 130's and heart rate in the 60's to 70's. He reports good compliance with his CPAP. He has also significantly reduced his caffeine intake as he was previously consuming 6-7 cans of Pepsi Max per day but has switched to caffeine free sodas.   Past Medical History:  Diagnosis Date  . Allergy   . Arthritis   . Atrial flutter (HCC)    a. diagnosed in 11/2016 --> started on Eliquis for  anticoagulation b. s/p DCCV in 01/2017  . Celiac disease   . Chronic rhinitis   . Coronary artery disease    a. s/p remote stenting of RCA in 1998  . Diabetes mellitus without complication (HCC)   . GERD (gastroesophageal reflux disease)   . History of migraine headaches   . Hypercholesteremia   . Hypertension   . OSA (obstructive sleep apnea)     Past Surgical History:  Procedure Laterality Date  . ANKLE ARTHROPLASTY    . CARDIAC CATHETERIZATION  06/06/1997   EF 65%  . CARDIOVASCULAR STRESS TEST  12/07/2007   EF 58%  . CARDIOVERSION N/A 01/08/2017   Procedure: CARDIOVERSION;  Surgeon: Michael Stade, MD;  Location: [facility] ENDOSCOPY;  Service: Cardiovascular;  Laterality: N/A;  . CORONARY STENT PLACEMENT    . ELBOW ARTHROPLASTY    . EYE SURGERY    . KNEE ARTHROPLASTY    . NASAL SINUS SURGERY    . SHOULDER ACROMIOPLASTY    . SPINE SURGERY      Current Medications: Outpatient Medications Prior to Visit  Medication Sig Dispense Refill  . atorvastatin (LIPITOR) 80 MG tablet Take 1 tablet (80 mg total) by mouth daily. 90 tablet 0  . Canagliflozin (INVOKANA) 300 MG TABS Take by mouth. 1 tab daily    . doxycycline (VIBRAMYCIN) 100 MG capsule Take 1 capsule (100 mg total) by mouth 2 (two) times daily. 20 capsule 0  . fluticasone (FLONASE) 50 MCG/ACT nasal  spray Place 2 sprays into both nostrils daily. 16 g 11  . loratadine (CLARITIN) 10 MG tablet Take 1 tablet (10 mg total) by mouth daily. 30 tablet 11  . metFORMIN (GLUCOPHAGE) 500 MG tablet Take 500 mg by mouth 2 (two) times daily.      . methocarbamol (ROBAXIN) 500 MG tablet Take 1,000 mg by mouth 3 (three) times daily. Take as needed (unsure of dosage)    . mirtazapine (REMERON) 45 MG tablet Take 1 tablet by mouth at bedtime. Take 1 tab daily at bedtime    . morphine (KADIAN) 50 MG 24 hr capsule Take 1 capsule by mouth every 12 (twelve) hours. Take 1 tab every 12 hours  0  . Multiple Vitamin (MULTIVITAMIN) capsule Take 1 capsule by  mouth daily.      . nitroGLYCERIN (NITROSTAT) 0.4 MG SL tablet Place 1 tablet (0.4 mg total) under the tongue every 5 (five) minutes as needed for chest pain. 25 tablet 6  . ONE TOUCH ULTRA TEST test strip 1 each by Other route as needed. Use 1 strip to check glucose daily 100 each 4  . oxyCODONE-acetaminophen (PERCOCET) 10-325 MG per tablet Take 1 tablet by mouth 3 (three) times daily.      . TRADJENTA 5 MG TABS tablet 5 mg daily.     Marland Kitchen apixaban (ELIQUIS) 5 MG TABS tablet Take 1 tablet (5 mg total) by mouth 2 (two) times daily. 60 tablet 0  . diltiazem (CARDIZEM CD) 180 MG 24 hr capsule Take 1 capsule (180 mg total) by mouth daily. 90 capsule 3  . metoprolol (LOPRESSOR) 50 MG tablet Take 1 tablet (50 mg total) by mouth 2 (two) times daily. 180 tablet 3   No facility-administered medications prior to visit.      Allergies:   Ambien [zolpidem tartrate]; Amoxicillin; and Penicillins   Social History   Social History  . Marital status: Married    Spouse name: Michael Hartman  . Number of children: 0  . Years of education: N/A   Occupational History  . DETENTION OFFICER [employer]   Social History Main Topics  . Smoking status: Former Smoker    Packs/day: 4.00    Years: 20.00    Quit date: 06/09/1985  . Smokeless tobacco: Former Neurosurgeon  . Alcohol use No  . Drug use: No  . Sexual activity: Yes    Partners: Female    Birth control/ protection: None   Other Topics Concern  . None   Social History Narrative   Lives with his wife and their cat.     Family History:  The patient's family history includes Diabetes in his mother; Hypertension in his brother, father, and mother; Stroke in his father.   Review of Systems:   Please see the history of present illness.     General:  No chills, fever, night sweats or weight changes.  Cardiovascular:  No chest pain, dyspnea on exertion, edema, orthopnea, palpitations, paroxysmal nocturnal dyspnea. Dermatological: No rash,  lesions/masses Respiratory: No cough, dyspnea Urologic: No hematuria, dysuria Abdominal:   No nausea, vomiting, diarrhea, bright red blood per rectum, melena, or hematemesis Neurologic:  No visual changes, wkns, changes in mental status.  He denies any of the above symptoms.   All other systems reviewed and are otherwise negative except as noted above.   Physical Exam:    VS:  BP 122/82   Pulse 62   Ht 6\' 1"  (1.854 m)   Wt 257 lb (116.6 kg)  BMI 33.91 kg/m    General: Well developed, well nourished Caucasian male appearing in no acute distress. Head: Normocephalic, atraumatic, sclera non-icteric, no xanthomas, nares are without discharge.  Neck: No carotid bruits. JVD not elevated.  Lungs: Respirations regular and unlabored, without wheezes or rales.  Heart: Regular rate and rhythm. No S3 or S4.  No murmur, no rubs, or gallops appreciated. Abdomen: Soft, non-tender, non-distended with normoactive bowel sounds. No hepatomegaly. No rebound/guarding. No obvious abdominal masses. Msk:  Strength and tone appear normal for age. No joint deformities or effusions. Extremities: No clubbing or cyanosis. No lower extremity edema.  Distal pedal pulses are 2+ bilaterally. Neuro: Alert and oriented X 3. Moves all extremities spontaneously. No focal deficits noted. Psych:  Responds to questions appropriately with a normal affect. Skin: No rashes or lesions noted  Wt Readings from Last 3 Encounters:  01/28/17 257 lb (116.6 kg)  01/23/17 254 lb 9.6 oz (115.5 kg)  01/08/17 258 lb 6.4 oz (117.2 kg)     Studies/Labs Reviewed:   EKG:  EKG is ordered today.  The ekg ordered today demonstrates NSR, HR 62, no acute ST or T-wave changes.  Recent Labs: 11/18/2016: ALT 24; TSH 1.470 01/05/2017: BUN 16; Creatinine, Ser 0.95; Hemoglobin 15.2; Platelets 253; Potassium 5.1; Sodium 141   Lipid Panel    Component Value Date/Time   CHOL 136 11/18/2016 1030   TRIG 217 (H) 11/18/2016 1030   HDL 39 (L)  11/18/2016 1030   CHOLHDL 3.5 11/18/2016 1030   LDLCALC 54 11/18/2016 1030    Additional studies/ records that were reviewed today include:   Echocardiogram: 11/24/2016 Study Conclusions  - Left ventricle: The cavity size was mildly dilated. There was   mild focal basal hypertrophy of the septum. Systolic function was   normal. The estimated ejection fraction was 55%. Wall motion was   normal; there were no regional wall motion abnormalities. - Aortic valve: Trileaflet; mildly thickened, mildly calcified   leaflets. - Mitral valve: Calcified annulus. - Left atrium: The atrium was moderately dilated. - Right ventricle: The cavity size was mildly dilated. Wall   thickness was normal. - Pulmonic valve: There was trivial regurgitation.   DCCV: 01/08/2017 Anesthesia: Fitzgerald 200 mg propofol 60 mg lidocaine  DCC x 2 150 and 200J  Converted from afib rate 95 to NSR rate 78 No immediate neurologic sequelae  On Rx eliquis with no missed doses   Assessment:    1. Paroxysmal atrial fibrillation (HCC)   2. Current use of long term anticoagulation   3. Coronary artery disease involving native coronary artery of native heart without angina pectoris   4. Essential hypertension   5. Hyperlipidemia LDL goal <70   6. OSA (obstructive sleep apnea)      Plan:   In order of problems listed above:  1. Paroxysmal Atrial Fibrillation/ Use of Long-term Anticoagulation - initially diagnosed with atrial fibrillation back in 11/2016 and he denied any associated palpitations or dyspnea but noted significant fatigue. At his last visit in 12/2016, he reported good compliance with Eliquis and denied missing any recent doses. Therefore, DCCV was arranged and he underwent DCCV on 01/08/2017 with 150J and 200J with conversion to NSR.  - he reports significant improvement in his fatigue since his DCCV. EKG today shows he is maintaining NSR. - will continue Lopressor and Cardizem CD at current doses  as his HR and BP have been under excellent control on this regimen. Continued limitation of caffeine intake was recommended.  -  This patients CHA2DS2-VASc Score and unadjusted Ischemic Stroke Rate (% per year) is equal to 3.2 % stroke rate/year from a score of 3 (HTN, Vascular, DM). Continue Eliquis 5mg  BID for anticoagulation as he denies any evidence of active bleeding.   2. CAD - s/p stenting of RCA in 1998. - he denies any recent chest pain or dyspnea on exertion. EKG is without acute ischemic changes.  - continue BB and statin therapy. No ASA secondary to the need for Eliquis.   3. HTN - BP is well-controlled at 122/82 during today's visit. - continue current medication regimen.   4. HLD - Lipid Panel shows total cholesterol at 136, HDL 39, and LDL at 54. - continue Atorvastatin 80mg  daily.   5. OSA - continue CPAP.    Medication Adjustments/Labs and Tests Ordered: Current medicines are reviewed at length with the patient today.  Concerns regarding medicines are outlined above.  Medication changes, Labs and Tests ordered today are listed in the Patient Instructions below. Patient Instructions  Medication Instructions:  Your physician recommends that you continue on your current medications as directed. Please refer to the Current Medication list given to you today.  Labwork: None   Testing/Procedures:  Follow-Up: Your physician recommends that you schedule a follow-up appointment in: 3-4 MONTHS with DR Swaziland.  Any Other Special Instructions Will Be Listed Below (If Applicable).  If you need a refill on your cardiac medications before your next appointment, please call your pharmacy.   Signed, Ellsworth Lennox, PA-C  01/28/2017 4:38 PM    [facility] [address] [place], [address] [place], [place]  [address] Phone: [phone]; Fax: [phone]  [address], [address] [place], [place] [address] Phone: [phone]

## 2017-01-28 ENCOUNTER — Ambulatory Visit (INDEPENDENT_AMBULATORY_CARE_PROVIDER_SITE_OTHER): Payer: 59 | Admitting: Student

## 2017-01-28 ENCOUNTER — Encounter: Payer: Self-pay | Admitting: Student

## 2017-01-28 VITALS — BP 122/82 | HR 62 | Ht 73.0 in | Wt 257.0 lb

## 2017-01-28 DIAGNOSIS — G4733 Obstructive sleep apnea (adult) (pediatric): Secondary | ICD-10-CM

## 2017-01-28 DIAGNOSIS — I10 Essential (primary) hypertension: Secondary | ICD-10-CM | POA: Diagnosis not present

## 2017-01-28 DIAGNOSIS — I2510 Atherosclerotic heart disease of native coronary artery without angina pectoris: Secondary | ICD-10-CM

## 2017-01-28 DIAGNOSIS — E785 Hyperlipidemia, unspecified: Secondary | ICD-10-CM | POA: Diagnosis not present

## 2017-01-28 DIAGNOSIS — Z7901 Long term (current) use of anticoagulants: Secondary | ICD-10-CM | POA: Diagnosis not present

## 2017-01-28 DIAGNOSIS — I480 Paroxysmal atrial fibrillation: Secondary | ICD-10-CM

## 2017-01-28 MED ORDER — DILTIAZEM HCL ER COATED BEADS 180 MG PO CP24: 180.0000 mg | ORAL_CAPSULE | Freq: Every day | ORAL | 3 refills | Status: DC

## 2017-01-28 MED ORDER — APIXABAN 5 MG PO TABS: 5.0000 mg | ORAL_TABLET | Freq: Two times a day (BID) | ORAL | 2 refills | Status: DC

## 2017-01-28 MED ORDER — METOPROLOL TARTRATE 50 MG PO TABS: 50.0000 mg | ORAL_TABLET | Freq: Two times a day (BID) | ORAL | 3 refills | Status: DC

## 2017-01-28 NOTE — Patient Instructions (Signed)
Medication Instructions:  Your physician recommends that you continue on your current medications as directed. Please refer to the Current Medication list given to you today.  Labwork: None   Testing/Procedures:   Follow-Up: Your physician recommends that you schedule a follow-up appointment in: 3-4 MONTHS with DR Swaziland.  Any Other Special Instructions Will Be Listed Below (If Applicable).  If you need a refill on your cardiac medications before your next appointment, please call your pharmacy.

## 2017-02-02 ENCOUNTER — Other Ambulatory Visit: Payer: Self-pay | Admitting: *Deleted

## 2017-02-02 DIAGNOSIS — I480 Paroxysmal atrial fibrillation: Secondary | ICD-10-CM

## 2017-02-02 MED ORDER — APIXABAN 5 MG PO TABS: 5.0000 mg | ORAL_TABLET | Freq: Two times a day (BID) | ORAL | 2 refills | Status: DC

## 2017-02-02 MED ORDER — DILTIAZEM HCL ER COATED BEADS 180 MG PO CP24: 180.0000 mg | ORAL_CAPSULE | Freq: Every day | ORAL | 3 refills | Status: DC

## 2017-02-20 ENCOUNTER — Other Ambulatory Visit: Payer: Self-pay

## 2017-02-20 DIAGNOSIS — I480 Paroxysmal atrial fibrillation: Secondary | ICD-10-CM

## 2017-02-20 MED ORDER — METOPROLOL TARTRATE 50 MG PO TABS: 50.0000 mg | ORAL_TABLET | Freq: Two times a day (BID) | ORAL | 0 refills | Status: DC

## 2017-04-22 NOTE — Telephone Encounter (Signed)
No longer needed

## 2017-04-27 ENCOUNTER — Other Ambulatory Visit: Payer: Self-pay | Admitting: Cardiology

## 2017-04-27 DIAGNOSIS — I480 Paroxysmal atrial fibrillation: Secondary | ICD-10-CM

## 2017-04-28 NOTE — Telephone Encounter (Signed)
REFILL 

## 2017-05-02 NOTE — Progress Notes (Signed)
Cardiology Office Note    Date:  05/05/2017   ID:  Michael LingerGlenn W Hartman, DOB 12/05/1955, MRN [id number]  PCP:  Gaspar Michael Hartman, Michael W, MD  Cardiologist: Dr. SwazilandJordan   Chief Complaint  Patient presents with  . Atrial Fibrillation  . Coronary Artery Disease    History of Present Illness:    Michael Hartman is a 61 y.o. male with past medical history of CAD (s/p stenting of RCA in 1998), PAF (on Eliquis), HTN, HLD, Type 2 DM, and OSA (on CPAP) who presents to the office today for follow-up of atrial fibrillation.   He was seen on 01/05/2017 and reported improvement of his atrial fibrillation with recent dose adjustment of his Cardizem. While he denied any palpitations or dyspnea on exertion he had noted significant fatigue since being diagnosed with atrial fibrillation back in 11/2016. He reported good compliance with Eliquis and denied missing any recent doses therefore a DCCV was recommended for hopeful return to NSR. He presented [facility] on 01/08/2017 for the procedure and underwent DCCV x2 with 150J and 200J with conversion to NSR.   On follow up today he is feeling very well. He can tell a big improvement in his energy with restoration of NSR. He denies any palpitations, chest pain, dyspnea, fatigue, or edema. He has lost 6 lbs and cut way back on caffeine. Admits he is not exercising much. Has membership at [employer].    Past Medical History:  Diagnosis Date  . Allergy   . Arthritis   . Atrial flutter (HCC)    a. diagnosed in 11/2016 --> started on Eliquis for anticoagulation b. s/p DCCV in 01/2017  . Celiac disease   . Chronic rhinitis   . Coronary artery disease    a. s/p remote stenting of RCA in 1998  . Diabetes mellitus without complication (HCC)   . GERD (gastroesophageal reflux disease)   . History of migraine headaches   . Hypercholesteremia   . Hypertension   . OSA (obstructive sleep apnea)     Past Surgical History:  Procedure Laterality Date  . ANKLE  ARTHROPLASTY    . CARDIAC CATHETERIZATION  06/06/1997   EF 65%  . CARDIOVASCULAR STRESS TEST  12/07/2007   EF 58%  . CARDIOVERSION N/A 01/08/2017   Procedure: CARDIOVERSION;  Surgeon: Wendall StadeNishan, Coleston Dirosa C, MD;  [facility] ENDOSCOPY;  Service: Cardiovascular;  Laterality: N/A;  . CORONARY STENT PLACEMENT    . ELBOW ARTHROPLASTY    . EYE SURGERY    . KNEE ARTHROPLASTY    . NASAL SINUS SURGERY    . SHOULDER ACROMIOPLASTY    . SPINE SURGERY      Current Medications: Outpatient Medications Prior to Visit  Medication Sig Dispense Refill  . apixaban (ELIQUIS) 5 MG TABS tablet Take 1 tablet (5 mg total) by mouth 2 (two) times daily. 180 tablet 2  . atorvastatin (LIPITOR) 80 MG tablet Take 1 tablet (80 mg total) by mouth daily. 90 tablet 0  . Canagliflozin (INVOKANA) 300 MG TABS Take by mouth. 1 tab daily    . diltiazem (DILACOR XR) 180 MG 24 hr capsule Take 180 mg by mouth daily.    . fluticasone (FLONASE) 50 MCG/ACT nasal spray Place 2 sprays into both nostrils daily. 16 g 11  . loratadine (CLARITIN) 10 MG tablet Take 1 tablet (10 mg total) by mouth daily. 30 tablet 11  . metFORMIN (GLUCOPHAGE) 500 MG tablet Take 500 mg by mouth 2 (two) times daily.      .Marland Kitchen  methocarbamol (ROBAXIN) 500 MG tablet Take 1,000 mg by mouth 3 (three) times daily. Take as needed (unsure of dosage)    . metoprolol tartrate (LOPRESSOR) 50 MG tablet Take 1 tablet (50 mg total) 2 (two) times daily by mouth. KEEP OV. 60 tablet 0  . mirtazapine (REMERON) 45 MG tablet Take 1 tablet by mouth at bedtime. Take 1 tab daily at bedtime    . morphine (KADIAN) 50 MG 24 hr capsule Take 1 capsule by mouth every 12 (twelve) hours. Take 1 tab every 12 hours  0  . Multiple Vitamin (MULTIVITAMIN) capsule Take 1 capsule by mouth daily.      . ONE TOUCH ULTRA TEST test strip 1 each by Other route as needed. Use 1 strip to check glucose daily 100 each 4  . oxyCODONE-acetaminophen (PERCOCET) 10-325 MG per tablet Take 1 tablet by mouth 3 (three)  times daily.      . TRADJENTA 5 MG TABS tablet 5 mg daily.     Marland Kitchen. doxycycline (VIBRAMYCIN) 100 MG capsule Take 1 capsule (100 mg total) by mouth 2 (two) times daily. 20 capsule 0  . nitroGLYCERIN (NITROSTAT) 0.4 MG SL tablet Place 1 tablet (0.4 mg total) under the tongue every 5 (five) minutes as needed for chest pain. 25 tablet 6  . diltiazem (CARDIZEM CD) 180 MG 24 hr capsule Take 1 capsule (180 mg total) by mouth daily. 90 capsule 3   No facility-administered medications prior to visit.      Allergies:   Ambien [zolpidem tartrate]; Amoxicillin; and Penicillins   Social History   Socioeconomic History  . Marital status: Married    Spouse name: Michael Hartman  . Number of children: 0  . Years of education: None  . Highest education level: None  Social Needs  . Financial resource strain: None  . Food insecurity - worry: None  . Food insecurity - inability: None  . Transportation needs - medical: None  . Transportation needs - non-medical: None  Occupational History  . Occupation: DETENTION OFFICER    Employer: [employer]  Tobacco Use  . Smoking status: Former Smoker    Packs/day: 4.00    Years: 20.00    Pack years: 80.00    Last attempt to quit: 06/09/1985    Years since quitting: 31.9  . Smokeless tobacco: Former Engineer, [employer]  Substance and Sexual Activity  . Alcohol use: No  . Drug use: No  . Sexual activity: Yes    Partners: Female    Birth control/protection: None  Other Topics Concern  . None  Social History Narrative   Lives with his wife and their cat.     Family History:  The patient's family history includes Diabetes in his mother; Hypertension in his brother, father, and mother; Stroke in his father.   Review of Systems:   Please see the history of present illness.    All other systems reviewed and are otherwise negative except as noted above.   Physical Exam:    VS:  BP 140/90   Pulse 70   Ht 6\' 2"  (1.88 m)   Wt 251 lb 4 oz (114 kg)   BMI 32.26 kg/m      GENERAL:  Well appearing HEENT:  PERRL, EOMI, sclera are clear. Oropharynx is clear. NECK:  No jugular venous distention, carotid upstroke brisk and symmetric, no bruits, no thyromegaly or adenopathy LUNGS:  Clear to auscultation bilaterally CHEST:  Unremarkable HEART:  RRR,  PMI not displaced or sustained,S1 and S2  within normal limits, no S3, no S4: no clicks, no rubs, no murmurs ABD:  Soft, nontender. BS +, no masses or bruits. No hepatomegaly, no splenomegaly EXT:  2 + pulses throughout, no edema, no cyanosis no clubbing SKIN:  Warm and dry.  No rashes NEURO:  Alert and oriented x 3. Cranial nerves II through XII intact. PSYCH:  Cognitively intact    Wt Readings from Last 3 Encounters:  05/05/17 251 lb 4 oz (114 kg)  01/28/17 257 lb (116.6 kg)  01/23/17 254 lb 9.6 oz (115.5 kg)     Studies/Labs Reviewed:   Recent Labs: 11/18/2016: ALT 24; TSH 1.470 01/05/2017: BUN 16; Creatinine, Ser 0.95; Hemoglobin 15.2; Platelets 253; Potassium 5.1; Sodium 141   Lipid Panel    Component Value Date/Time   CHOL 136 11/18/2016 1030   TRIG 217 (H) 11/18/2016 1030   HDL 39 (L) 11/18/2016 1030   CHOLHDL 3.5 11/18/2016 1030   LDLCALC 54 11/18/2016 1030   Dated 03/31/17: A1c 7.8%. Hgb and chemistries normal.  Additional studies/ records that were reviewed today include:   Echocardiogram: 11/24/2016 Study Conclusions  - Left ventricle: The cavity size was mildly dilated. There was   mild focal basal hypertrophy of the septum. Systolic function was   normal. The estimated ejection fraction was 55%. Wall motion was   normal; there were no regional wall motion abnormalities. - Aortic valve: Trileaflet; mildly thickened, mildly calcified   leaflets. - Mitral valve: Calcified annulus. - Left atrium: The atrium was moderately dilated. - Right ventricle: The cavity size was mildly dilated. Wall   thickness was normal. - Pulmonic valve: There was trivial regurgitation.    Assessment:     1. Paroxysmal atrial fibrillation (HCC)   2. Coronary artery disease involving native coronary artery of native heart without angina pectoris   3. Essential hypertension   4. HYPERCHOLESTEROLEMIA   5. OSA (obstructive sleep apnea)      Plan:   In order of problems listed above:  1. Paroxysmal Atrial Fibrillation/ Use of Long-term Anticoagulation - initially diagnosed with atrial fibrillation back in 11/2016 now s/p DCCV on 01/08/2017. - he reports significant improvement in his fatigue since his DCCV. He is maintaining NSR on exam.  - will continue Lopressor and Cardizem CD at current doses. Encourage focus on weight loss and regular aerobic exercise.  - This patients CHA2DS2-VASc Score and unadjusted Ischemic Stroke Rate (% per year) is equal to 3.2 % stroke rate/year from a score of 3 (HTN, Vascular, DM). Continue Eliquis 5mg  BID for anticoagulation.   2. CAD - s/p stenting of RCA in 1998. - he denies any recent chest pain or dyspnea on exertion.  - continue BB and statin therapy.   3. HTN - BP is generally well controlled.  4. HLD - Lipid Panel shows total cholesterol at 136, HDL 39, and LDL at 54. - continue Atorvastatin 80mg  daily.   5. OSA - continue CPAP.    Signed, Susane Bey Swaziland, MD,FACC 05/05/2017 9:45 AM    [facility] [address] [place], [place] [address] Phone: [phone]

## 2017-05-05 ENCOUNTER — Other Ambulatory Visit: Payer: Self-pay

## 2017-05-05 ENCOUNTER — Ambulatory Visit: Payer: 59 | Admitting: Cardiology

## 2017-05-05 ENCOUNTER — Encounter: Payer: Self-pay | Admitting: Cardiology

## 2017-05-05 VITALS — BP 140/90 | HR 70 | Ht 74.0 in | Wt 251.2 lb

## 2017-05-05 DIAGNOSIS — G4733 Obstructive sleep apnea (adult) (pediatric): Secondary | ICD-10-CM | POA: Diagnosis not present

## 2017-05-05 DIAGNOSIS — I2510 Atherosclerotic heart disease of native coronary artery without angina pectoris: Secondary | ICD-10-CM | POA: Diagnosis not present

## 2017-05-05 DIAGNOSIS — E7800 Pure hypercholesterolemia, unspecified: Secondary | ICD-10-CM | POA: Diagnosis not present

## 2017-05-05 DIAGNOSIS — I480 Paroxysmal atrial fibrillation: Secondary | ICD-10-CM | POA: Diagnosis not present

## 2017-05-05 DIAGNOSIS — I10 Essential (primary) hypertension: Secondary | ICD-10-CM

## 2017-05-05 MED ORDER — NITROGLYCERIN 0.4 MG SL SUBL: 0.4000 mg | SUBLINGUAL_TABLET | SUBLINGUAL | 11 refills | Status: DC | PRN

## 2017-05-05 NOTE — Patient Instructions (Signed)
Continue your current therapy  Focus on more aerobic exercise and weight loss  I will see you in 6 months.

## 2017-05-25 ENCOUNTER — Other Ambulatory Visit: Payer: Self-pay

## 2017-05-25 ENCOUNTER — Ambulatory Visit: Payer: 59 | Admitting: Physician Assistant

## 2017-05-25 VITALS — BP 124/78 | HR 64 | Temp 98.0°F | Resp 18 | Ht 74.0 in | Wt 246.0 lb

## 2017-05-25 DIAGNOSIS — J019 Acute sinusitis, unspecified: Secondary | ICD-10-CM | POA: Diagnosis not present

## 2017-05-25 MED ORDER — DOXYCYCLINE HYCLATE 100 MG PO CAPS: 100.0000 mg | ORAL_CAPSULE | Freq: Two times a day (BID) | ORAL | 0 refills | Status: AC

## 2017-05-25 NOTE — Patient Instructions (Signed)
Please take mucinex 1200mg  every 12 hours for the next 7 days. Take medications as prescribed. You can use delsym for the coughing. Please obtain a humidifier for the room.   Continue to hydrate well as you have been doing. Continue nasal saline spray for the nostrils.  You can do this any time of the day, at least 3 times per day.  Sinusitis, Adult Sinusitis is soreness and inflammation of your sinuses. Sinuses are hollow spaces in the bones around your face. They are located:  Around your eyes.  In the middle of your forehead.  Behind your nose.  In your cheekbones.  Your sinuses and nasal passages are lined with a stringy fluid (mucus). Mucus normally drains out of your sinuses. When your nasal tissues get inflamed or swollen, the mucus can get trapped or blocked so air cannot flow through your sinuses. This lets bacteria, viruses, and funguses grow, and that leads to infection. Follow these instructions at home: Medicines  Take, use, or apply over-the-counter and prescription medicines only as told by your doctor. These may include nasal sprays.  If you were prescribed an antibiotic medicine, take it as told by your doctor. Do not stop taking the antibiotic even if you start to feel better. Hydrate and Humidify  Drink enough water to keep your pee (urine) clear or pale yellow.  Use a cool mist humidifier to keep the humidity level in your home above 50%.  Breathe in steam for 10-15 minutes, 3-4 times a day or as told by your doctor. You can do this in the bathroom while a hot shower is running.  Try not to spend time in cool or dry air. Rest  Rest as much as possible.  Sleep with your head raised (elevated).  Make sure to get enough sleep each night. General instructions  Put a warm, moist washcloth on your face 3-4 times a day or as told by your doctor. This will help with discomfort.  Wash your hands often with soap and water. If there is no soap and water, use hand  sanitizer.  Do not smoke. Avoid being around people who are smoking (secondhand smoke).  Keep all follow-up visits as told by your doctor. This is important. Contact a doctor if:  You have a fever.  Your symptoms get worse.  Your symptoms do not get better within 10 days. Get help right away if:  You have a very bad headache.  You cannot stop throwing up (vomiting).  You have pain or swelling around your face or eyes.  You have trouble seeing.  You feel confused.  Your neck is stiff.  You have trouble breathing. This information is not intended to replace advice given to you by your health care provider. Make sure you discuss any questions you have with your health care provider. Document Released: 11/12/2007 Document Revised: 01/20/2016 Document Reviewed: 03/21/2015 Elsevier Interactive Patient Education  [employer]2018 Elsevier Inc.

## 2017-05-25 NOTE — Progress Notes (Signed)
[facility] [address], [place] [phone]  Date:  05/25/2017   Name:  Michael Hartman   DOB:  11-03-1955   MRN:  [id number]  PCP:  Gaspar Garbeisovec, Richard W, MD    History of Present Illness:  Michael Hartman is a 61 y.o. male patient who presents to PCP with  Chief Complaint  Patient presents with  . Nasal Congestion    chest congestion/ x 1 wk.  . Cough    x 1wk     4 weeks ago, with nasal congestion.  Nasal congestion, post nasal drainage.  Feels heavy and tight.  He has productive cough.  Feels like it is hard to get a deep breath.  Some runny nose.  No ear pain.  Temperature went to 100.  He was prescribed a zpak 3 weeks ago.  He found some improvement of his symptoms, but then symptoms resurfaced about 1 week ago.  History of pneumonia. He has some sore throat.    Patient Active Problem List   Diagnosis Date Noted  . Typical atrial flutter (HCC) 01/05/2017  . Current use of long term anticoagulation 01/05/2017  . Diabetes mellitus type 2 in obese (HCC) 03/23/2012  . HYPERCHOLESTEROLEMIA 08/02/2007  . OSA (obstructive sleep apnea) 08/02/2007  . Essential hypertension 08/02/2007  . CAD (coronary artery disease) 08/02/2007  . RHINITIS, CHRONIC 08/02/2007    Past Medical History:  Diagnosis Date  . Allergy   . Arthritis   . Atrial flutter (HCC)    a. diagnosed in 11/2016 --> started on Eliquis for anticoagulation b. s/p DCCV in 01/2017  . Celiac disease   . Chronic rhinitis   . Coronary artery disease    a. s/p remote stenting of RCA in 1998  . Diabetes mellitus without complication (HCC)   . GERD (gastroesophageal reflux disease)   . History of migraine headaches   . Hypercholesteremia   . Hypertension   . OSA (obstructive sleep apnea)     Past Surgical History:  Procedure Laterality Date  . ANKLE ARTHROPLASTY    . CARDIAC CATHETERIZATION  06/06/1997   EF 65%  . CARDIOVASCULAR STRESS TEST  12/07/2007   EF 58%  . CARDIOVERSION N/A  01/08/2017   Procedure: CARDIOVERSION;  Surgeon: Wendall StadeNishan, Peter C, MD;  [facility];  Service: Cardiovascular;  Laterality: N/A;  . CORONARY STENT PLACEMENT    . ELBOW ARTHROPLASTY    . EYE SURGERY    . KNEE ARTHROPLASTY    . NASAL SINUS SURGERY    . SHOULDER ACROMIOPLASTY    . SPINE SURGERY      Social History   Tobacco Use  . Smoking status: Former Smoker    Packs/day: 4.00    Years: 20.00    Pack years: 80.00    Last attempt to quit: 06/09/1985    Years since quitting: 31.9  . Smokeless tobacco: Former Engineer, waterUser  Substance Use Topics  . Alcohol use: No  . Drug use: No    Family History  Problem Relation Age of Onset  . Hypertension Mother   . Diabetes Mother   . Stroke Father   . Hypertension Father   . Hypertension Brother     Allergies  Allergen Reactions  . Ambien [Zolpidem Tartrate]     Amnesia, odd behavior  . Amoxicillin   . Penicillins     Child hood    Medication list has been reviewed and updated.  Current Outpatient Medications on File Prior  to Visit  Medication Sig Dispense Refill  . apixaban (ELIQUIS) 5 MG TABS tablet Take 1 tablet (5 mg total) by mouth 2 (two) times daily. 180 tablet 2  . atorvastatin (LIPITOR) 80 MG tablet Take 1 tablet (80 mg total) by mouth daily. 90 tablet 0  . Canagliflozin (INVOKANA) 300 MG TABS Take by mouth. 1 tab daily    . diltiazem (DILACOR XR) 180 MG 24 hr capsule Take 180 mg by mouth daily.    . fluticasone (FLONASE) 50 MCG/ACT nasal spray Place 2 sprays into both nostrils daily. 16 g 11  . loratadine (CLARITIN) 10 MG tablet Take 1 tablet (10 mg total) by mouth daily. 30 tablet 11  . metFORMIN (GLUCOPHAGE) 500 MG tablet Take 500 mg by mouth 2 (two) times daily.      . methocarbamol (ROBAXIN) 500 MG tablet Take 1,000 mg by mouth 3 (three) times daily. Take as needed (unsure of dosage)    . metoprolol tartrate (LOPRESSOR) 50 MG tablet Take 1 tablet (50 mg total) 2 (two) times daily by mouth. KEEP OV. 60 tablet 0  .  mirtazapine (REMERON) 45 MG tablet Take 1 tablet by mouth at bedtime. Take 1 tab daily at bedtime    . morphine (KADIAN) 50 MG 24 hr capsule Take 1 capsule by mouth every 12 (twelve) hours. Take 1 tab every 12 hours  0  . Multiple Vitamin (MULTIVITAMIN) capsule Take 1 capsule by mouth daily.      . nitroGLYCERIN (NITROSTAT) 0.4 MG SL tablet Place 1 tablet (0.4 mg total) under the tongue every 5 (five) minutes as needed for chest pain. 25 tablet 11  . ONE TOUCH ULTRA TEST test strip 1 each by Other route as needed. Use 1 strip to check glucose daily 100 each 4  . oxyCODONE-acetaminophen (PERCOCET) 10-325 MG per tablet Take 1 tablet by mouth 3 (three) times daily.      . TRADJENTA 5 MG TABS tablet 5 mg daily.     Marland Kitchen. diltiazem (CARDIZEM CD) 180 MG 24 hr capsule Take 1 capsule (180 mg total) by mouth daily. 90 capsule 3   No current facility-administered medications on file prior to visit.     ROS ROS otherwise unremarkable unless listed above.  Physical Examination: BP 124/78   Pulse 64   Temp 98 F (36.7 C) (Oral)   Resp 18   Ht 6\' 2"  (1.88 m)   Wt 246 lb (111.6 kg)   SpO2 99%   BMI 31.58 kg/m  Ideal Body Weight: Weight in (lb) to have BMI = 25: 194.3  Physical Exam  Constitutional: He is oriented to person, place, and time. He appears well-developed and well-nourished. No distress.  HENT:  Head: Normocephalic and atraumatic.  Right Ear: Tympanic membrane, external ear and ear canal normal.  Left Ear: Tympanic membrane, external ear and ear canal normal.  Nose: Mucosal edema and rhinorrhea present. Right sinus exhibits maxillary sinus tenderness. Right sinus exhibits no frontal sinus tenderness. Left sinus exhibits maxillary sinus tenderness. Left sinus exhibits no frontal sinus tenderness.  Mouth/Throat: No uvula swelling. No oropharyngeal exudate, posterior oropharyngeal edema or posterior oropharyngeal erythema.  Eyes: Conjunctivae, EOM and lids are normal. Pupils are equal,  round, and reactive to light. Right eye exhibits normal extraocular motion. Left eye exhibits normal extraocular motion.  Neck: Trachea normal and full passive range of motion without pain. No edema and no erythema present.  Cardiovascular: Normal rate.  Pulmonary/Chest: Effort normal. No respiratory distress. He has no  decreased breath sounds. He has no wheezes. He has no rhonchi.  Neurological: He is alert and oriented to person, place, and time.  Skin: Skin is warm and dry. He is not diaphoretic.  Psychiatric: He has a normal mood and affect. His behavior is normal.     Assessment and Plan: Michael Hartman is a 61 y.o. male who is here today for cc of  Chief Complaint  Patient presents with  . Nasal Congestion    chest congestion/ x 1 wk.  . Cough    x 1wk  treating for sinus infection due to longevity and depth of symptoms.  Advised mucinex, otc cough medications, and humidifier.  Follow up as needed.  Subacute sinusitis, unspecified location - Plan: doxycycline (VIBRAMYCIN) 100 MG capsule  Trena Platt, PA-C [facility] 12/26/20189:53 PM

## 2017-05-28 ENCOUNTER — Other Ambulatory Visit: Payer: Self-pay | Admitting: Cardiology

## 2017-05-28 DIAGNOSIS — I480 Paroxysmal atrial fibrillation: Secondary | ICD-10-CM

## 2017-06-03 ENCOUNTER — Encounter: Payer: Self-pay | Admitting: Physician Assistant

## 2017-07-24 ENCOUNTER — Emergency Department (HOSPITAL_COMMUNITY)
Admission: EM | Admit: 2017-07-24 | Discharge: 2017-07-24 | Disposition: A | Discharge status: Home / Self Care | Payer: 59 | Attending: Emergency Medicine | Admitting: Emergency Medicine

## 2017-07-24 ENCOUNTER — Encounter (HOSPITAL_COMMUNITY): Payer: Self-pay | Admitting: Nurse Practitioner

## 2017-07-24 DIAGNOSIS — S61211A Laceration without foreign body of left index finger without damage to nail, initial encounter: Secondary | ICD-10-CM | POA: Diagnosis present

## 2017-07-24 DIAGNOSIS — Y93G1 Activity, food preparation and clean up: Secondary | ICD-10-CM | POA: Insufficient documentation

## 2017-07-24 DIAGNOSIS — Z87891 Personal history of nicotine dependence: Secondary | ICD-10-CM | POA: Diagnosis not present

## 2017-07-24 DIAGNOSIS — Y999 Unspecified external cause status: Secondary | ICD-10-CM | POA: Insufficient documentation

## 2017-07-24 DIAGNOSIS — Z7984 Long term (current) use of oral hypoglycemic drugs: Secondary | ICD-10-CM | POA: Insufficient documentation

## 2017-07-24 DIAGNOSIS — E119 Type 2 diabetes mellitus without complications: Secondary | ICD-10-CM | POA: Diagnosis not present

## 2017-07-24 DIAGNOSIS — I10 Essential (primary) hypertension: Secondary | ICD-10-CM | POA: Insufficient documentation

## 2017-07-24 DIAGNOSIS — Z79899 Other long term (current) drug therapy: Secondary | ICD-10-CM | POA: Diagnosis not present

## 2017-07-24 DIAGNOSIS — I2510 Atherosclerotic heart disease of native coronary artery without angina pectoris: Secondary | ICD-10-CM | POA: Diagnosis not present

## 2017-07-24 DIAGNOSIS — Y929 Unspecified place or not applicable: Secondary | ICD-10-CM | POA: Insufficient documentation

## 2017-07-24 DIAGNOSIS — I4892 Unspecified atrial flutter: Secondary | ICD-10-CM | POA: Insufficient documentation

## 2017-07-24 DIAGNOSIS — W260XXA Contact with knife, initial encounter: Secondary | ICD-10-CM | POA: Insufficient documentation

## 2017-07-24 DIAGNOSIS — Z7901 Long term (current) use of anticoagulants: Secondary | ICD-10-CM | POA: Insufficient documentation

## 2017-07-24 MED ORDER — BACITRACIN ZINC 500 UNIT/GM EX OINT
TOPICAL_OINTMENT | CUTANEOUS | Status: AC
  Filled 2017-07-24: qty 1.8

## 2017-07-24 MED ORDER — LIDOCAINE-EPINEPHRINE (PF) 2 %-1:200000 IJ SOLN
10.0000 mL | Freq: Once | INTRAMUSCULAR | Status: AC
  Administered 2017-07-24: 22:00:00 via INTRADERMAL
  Filled 2017-07-24: qty 20

## 2017-07-24 NOTE — ED Triage Notes (Signed)
Pt presents with a left index finger lac that he reports he sustained from a ceramic knife, pt also reports being on Eliquis anticoagulation therapy.

## 2017-07-24 NOTE — ED Provider Notes (Signed)
[facility]-EMERGENCY DEPT Provider Note   CSN: [id number] Arrival date & time: 07/24/17  1913     History   Chief Complaint Chief Complaint  Patient presents with  . Laceration    Left Index    HPI Michael Hartman is a 62 y.o. male.  HPI Michael Hartman is a 62 y.o. male presents to ed with complaint of left index finger laceration.  Patient was cutting vegetables ceramic knife and cut the distal tip of his finger.  He is on Eliquis, unable to stop bleeding at home so came to emergency department.  Tetanus is up-to-date.  No other injuries or complaints.  Pressure applied to the finger to stop the bleeding. Pain present to the lacerated tip. Nothing making symptoms better or worse.   Past Medical History:  Diagnosis Date  . Allergy   . Arthritis   . Atrial flutter (HCC)    a. diagnosed in 11/2016 --> started on Eliquis for anticoagulation b. s/p DCCV in 01/2017  . Celiac disease   . Chronic rhinitis   . Coronary artery disease    a. s/p remote stenting of RCA in 1998  . Diabetes mellitus without complication (HCC)   . GERD (gastroesophageal reflux disease)   . History of migraine headaches   . Hypercholesteremia   . Hypertension   . OSA (obstructive sleep apnea)     Patient Active Problem List   Diagnosis Date Noted  . Typical atrial flutter (HCC) 01/05/2017  . Current use of long term anticoagulation 01/05/2017  . Diabetes mellitus type 2 in obese (HCC) 03/23/2012  . HYPERCHOLESTEROLEMIA 08/02/2007  . OSA (obstructive sleep apnea) 08/02/2007  . Essential hypertension 08/02/2007  . CAD (coronary artery disease) 08/02/2007  . RHINITIS, CHRONIC 08/02/2007    Past Surgical History:  Procedure Laterality Date  . ANKLE ARTHROPLASTY    . CARDIAC CATHETERIZATION  06/06/1997   EF 65%  . CARDIOVASCULAR STRESS TEST  12/07/2007   EF 58%  . CARDIOVERSION N/A 01/08/2017   Procedure: CARDIOVERSION;  Surgeon: Wendall Stade, MD;  Location: [facility] ENDOSCOPY;   Service: Cardiovascular;  Laterality: N/A;  . CORONARY STENT PLACEMENT    . ELBOW ARTHROPLASTY    . EYE SURGERY    . KNEE ARTHROPLASTY    . NASAL SINUS SURGERY    . SHOULDER ACROMIOPLASTY    . SPINE SURGERY         Home Medications    Prior to Admission medications   Medication Sig Start Date End Date Taking? Authorizing Provider  apixaban (ELIQUIS) 5 MG TABS tablet Take 1 tablet (5 mg total) by mouth 2 (two) times daily. 02/02/17   [place], Peter M, MD  atorvastatin (LIPITOR) 80 MG tablet Take 1 tablet (80 mg total) by mouth daily. 04/23/12   [place], Peter M, MD  Canagliflozin [facility]) 300 MG TABS Take by mouth. 1 tab daily    [provider]  diltiazem (CARDIZEM CD) 180 MG 24 hr capsule Take 1 capsule (180 mg total) by mouth daily. 02/02/17 05/03/17  [place], Peter M, MD  diltiazem (DILACOR XR) 180 MG 24 hr capsule Take 180 mg by mouth daily.    [provider]  fluticasone (FLONASE) 50 MCG/ACT nasal spray Place 2 sprays into both nostrils daily. 01/23/17   Ethelda Chick, MD  loratadine (CLARITIN) 10 MG tablet Take 1 tablet (10 mg total) by mouth daily. 01/23/17   Ethelda Chick, MD  metFORMIN (GLUCOPHAGE) 500 MG tablet Take 500  mg by mouth 2 (two) times daily.      [provider]  methocarbamol (ROBAXIN) 500 MG tablet Take 1,000 mg by mouth 3 (three) times daily. Take as needed (unsure of dosage)    [provider]  metoprolol tartrate (LOPRESSOR) 50 MG tablet TAKE 1 TABLET BY MOUTH  TWICE A DAY 05/28/17   [place], Peter M, MD  mirtazapine (REMERON) 45 MG tablet Take 1 tablet by mouth at bedtime. Take 1 tab daily at bedtime 04/03/15   [provider]  morphine (KADIAN) 50 MG 24 hr capsule Take 1 capsule by mouth every 12 (twelve) hours. Take 1 tab every 12 hours 04/05/15   [provider]  Multiple Vitamin (MULTIVITAMIN) capsule Take 1 capsule by mouth daily.      [provider]  nitroGLYCERIN (NITROSTAT) 0.4 MG SL tablet  Place 1 tablet (0.4 mg total) under the tongue every 5 (five) minutes as needed for chest pain. 05/05/17   [place], Peter M, MD  ONE TOUCH ULTRA TEST test strip 1 each by Other route as needed. Use 1 strip to check glucose daily 01/23/17   Ethelda ChickSmith, Kristi M, MD  oxyCODONE-acetaminophen (PERCOCET) 10-325 MG per tablet Take 1 tablet by mouth 3 (three) times daily.      [provider]  TRADJENTA 5 MG TABS tablet 5 mg daily.  03/03/12   [provider]    Family History Family History  Problem Relation Age of Onset  . Hypertension Mother   . Diabetes Mother   . Stroke Father   . Hypertension Father   . Hypertension Brother     Social History Social History   Tobacco Use  . Smoking status: Former Smoker    Packs/day: 4.00    Years: 20.00    Pack years: 80.00    Last attempt to quit: 06/09/1985    Years since quitting: 32.1  . Smokeless tobacco: Former Engineer, waterUser  Substance Use Topics  . Alcohol use: No  . Drug use: No     Allergies   Ambien [zolpidem tartrate]; Amoxicillin; and Penicillins   Review of Systems Review of Systems  Musculoskeletal: Positive for arthralgias.  Skin: Positive for wound.  Neurological: Negative for weakness and numbness.  All other systems reviewed and are negative.    Physical Exam Updated Vital Signs BP (!) 146/89 (BP Location: Right Arm)   Pulse 71   Temp 97.8 F (36.6 C) (Oral)   Resp 20   SpO2 98%   Physical Exam  Constitutional: He appears well-developed and well-nourished. No distress.  Eyes: Conjunctivae are normal.  Neck: Neck supple.  Cardiovascular: Normal rate.  Pulmonary/Chest: No respiratory distress.  Abdominal: He exhibits no distension.  Musculoskeletal:  Full range of motion of the left index finger at each joint  Skin: Skin is warm and dry.  Small, less than 1 cm skin avulsion to the distal fingertip of the left index finger.  Some bleeding present at this time.  Patient applying pressure to the area    Nursing note and vitals reviewed.    ED Treatments / Results  Labs (all labs ordered are listed, but only abnormal results are displayed) Labs Reviewed - No data to display  EKG  EKG Interpretation None       Radiology No results found.  Procedures Procedures (including critical care time)  Medications Ordered in ED Medications - No data to display   Initial Impression / Assessment and Plan / ED Course  I have reviewed  the triage vital signs and the nursing notes.  Pertinent labs & imaging results that were available during my care of the patient were reviewed by me and considered in my medical decision making (see chart for details).     Patient in emergency department with distal fingertip avulsion, very small flap laceration to the distal fingertip.  Applying pressure to stop the bleeding, some active bleeding noted.  Tetanus is up-to-date.  I have injected the wound with lidocaine with epinephrine to slow down the bleeding, and then stop the bleeding with cautery stick.  Patient tolerated procedure well.  Pressure dressing applied.  We will have him change the dressing tomorrow evening.  Advised if bleeding starts again to apply pressure, hold above his heart level, follow-up as needed  Vitals:   07/24/17 2015  BP: (!) 146/89  Pulse: 71  Resp: 20  Temp: 97.8 F (36.6 C)  TempSrc: Oral  SpO2: 98%    Final Clinical Impressions(s) / ED Diagnoses   Final diagnoses:  Laceration of left index finger without foreign body without damage to nail, initial encounter    ED Discharge Orders    None       Jaynie Crumble, PA-C 07/24/17 2218    Charlynne Pander, MD 07/27/17 [phone]

## 2017-07-24 NOTE — Discharge Instructions (Signed)
Keep your laceration clean and dry.  Change dressing tomorrow evening.  Apply nonstick dressing to the fingertip.  If bleeding starts again, blood pressure, hold above heart level.  If unable to stop the bleeding come back to the ER.

## 2017-09-05 ENCOUNTER — Other Ambulatory Visit: Payer: Self-pay

## 2017-09-05 ENCOUNTER — Encounter (HOSPITAL_COMMUNITY): Payer: Self-pay | Admitting: Emergency Medicine

## 2017-09-05 ENCOUNTER — Ambulatory Visit (HOSPITAL_COMMUNITY)
Admission: EM | Admit: 2017-09-05 | Discharge: 2017-09-05 | Disposition: A | Discharge status: Home / Self Care | Payer: 59 | Attending: Radiology | Admitting: Radiology

## 2017-09-05 ENCOUNTER — Other Ambulatory Visit: Payer: Self-pay | Admitting: Cardiology

## 2017-09-05 DIAGNOSIS — J069 Acute upper respiratory infection, unspecified: Secondary | ICD-10-CM

## 2017-09-05 DIAGNOSIS — I480 Paroxysmal atrial fibrillation: Secondary | ICD-10-CM

## 2017-09-05 MED ORDER — LEVOFLOXACIN 750 MG PO TABS: 750.0000 mg | ORAL_TABLET | Freq: Every day | ORAL | 0 refills | Status: DC

## 2017-09-05 MED ORDER — DEXAMETHASONE SODIUM PHOSPHATE 10 MG/ML IJ SOLN
10.0000 mg | Freq: Once | INTRAMUSCULAR | Status: AC
  Administered 2017-09-05: 10 mg via INTRAMUSCULAR

## 2017-09-05 MED ORDER — DEXAMETHASONE SODIUM PHOSPHATE 10 MG/ML IJ SOLN
INTRAMUSCULAR | Status: AC
  Filled 2017-09-05: qty 1

## 2017-09-05 NOTE — Discharge Instructions (Signed)
Continue to push fluids and take over the flonase and or allergy medication  as directed on the back of the box for symptomatic relief.

## 2017-09-05 NOTE — ED Provider Notes (Addendum)
[facility]    CSN: [id number] Arrival date & time: 09/05/17  1442     History   Chief Complaint Chief Complaint  Patient presents with  . URI    HPI Michael Hartman is a 62 y.o. male.   62 y.o. male presents with nasal congestion, sore throat, cough  X 3 days.  Condition is acute, and worsening in nature. Condition is made better by nothing. Condition is made worse by nothing. Patient denies any treatment prior to there arrival at this facility. Patient states that he was mowing the lawn without a mask prior to symptoms starting. Patient denies any fevers, productive cough. Patient is a diabetic education given on diabetes and steroid shots. Patient requests steriod at this time      Past Medical History:  Diagnosis Date  . Allergy   . Arthritis   . Atrial flutter (HCC)    a. diagnosed in 11/2016 --> started on Eliquis for anticoagulation b. s/p DCCV in 01/2017  . Celiac disease   . Chronic rhinitis   . Coronary artery disease    a. s/p remote stenting of RCA in 1998  . Diabetes mellitus without complication (HCC)   . GERD (gastroesophageal reflux disease)   . History of migraine headaches   . Hypercholesteremia   . Hypertension   . OSA (obstructive sleep apnea)     Patient Active Problem List   Diagnosis Date Noted  . Typical atrial flutter (HCC) 01/05/2017  . Current use of long term anticoagulation 01/05/2017  . Diabetes mellitus type 2 in obese (HCC) 03/23/2012  . HYPERCHOLESTEROLEMIA 08/02/2007  . OSA (obstructive sleep apnea) 08/02/2007  . Essential hypertension 08/02/2007  . CAD (coronary artery disease) 08/02/2007  . RHINITIS, CHRONIC 08/02/2007    Past Surgical History:  Procedure Laterality Date  . ANKLE ARTHROPLASTY    . CARDIAC CATHETERIZATION  06/06/1997   EF 65%  . CARDIOVASCULAR STRESS TEST  12/07/2007   EF 58%  . CARDIOVERSION N/A 01/08/2017   Procedure: CARDIOVERSION;  Surgeon: Wendall StadeNishan, Peter C, MD;  [facility];   Service: Cardiovascular;  Laterality: N/A;  . CORONARY STENT PLACEMENT    . ELBOW ARTHROPLASTY    . EYE SURGERY    . KNEE ARTHROPLASTY    . NASAL SINUS SURGERY    . SHOULDER ACROMIOPLASTY    . SPINE SURGERY         Home Medications    Prior to Admission medications   Medication Sig Start Date End Date Taking? Authorizing Provider  apixaban (ELIQUIS) 5 MG TABS tablet Take 1 tablet (5 mg total) by mouth 2 (two) times daily. 02/02/17  Yes [place], Peter M, MD  atorvastatin (LIPITOR) 80 MG tablet Take 1 tablet (80 mg total) by mouth daily. 04/23/12  Yes [place], Peter M, MD  Canagliflozin [facility](INVOKANA) 300 MG TABS Take by mouth. 1 tab daily   Yes [provider]  diltiazem (DILACOR XR) 180 MG 24 hr capsule Take 180 mg by mouth daily.   Yes [provider]  metFORMIN (GLUCOPHAGE) 500 MG tablet Take 500 mg by mouth 2 (two) times daily.     Yes [provider]  methocarbamol (ROBAXIN) 500 MG tablet Take 1,000 mg by mouth 3 (three) times daily. Take as needed (unsure of dosage)   Yes [provider]  metoprolol tartrate (LOPRESSOR) 50 MG tablet TAKE 1 TABLET BY MOUTH  TWICE A DAY 05/28/17  Yes [place], Peter M, MD  mirtazapine (REMERON) 45 MG tablet  Take 1 tablet by mouth at bedtime. Take 1 tab daily at bedtime 04/03/15  Yes [provider]  morphine (KADIAN) 50 MG 24 hr capsule Take 1 capsule by mouth every 12 (twelve) hours. Take 1 tab every 12 hours 04/05/15  Yes [provider]  Multiple Vitamin (MULTIVITAMIN) capsule Take 1 capsule by mouth daily.     Yes [provider]  ONE TOUCH ULTRA TEST test strip 1 each by Other route as needed. Use 1 strip to check glucose daily 01/23/17  Yes Ethelda Chick, MD  oxyCODONE-acetaminophen (PERCOCET) 10-325 MG per tablet Take 1 tablet by mouth 3 (three) times daily.     Yes [provider]  TRADJENTA 5 MG TABS tablet 5 mg daily.  03/03/12  Yes [provider]  diltiazem (CARDIZEM  CD) 180 MG 24 hr capsule Take 1 capsule (180 mg total) by mouth daily. 02/02/17 05/03/17  [place], Peter M, MD  fluticasone (FLONASE) 50 MCG/ACT nasal spray Place 2 sprays into both nostrils daily. 01/23/17   Ethelda Chick, MD  loratadine (CLARITIN) 10 MG tablet Take 1 tablet (10 mg total) by mouth daily. 01/23/17   Ethelda Chick, MD  nitroGLYCERIN (NITROSTAT) 0.4 MG SL tablet Place 1 tablet (0.4 mg total) under the tongue every 5 (five) minutes as needed for chest pain. 05/05/17   [place], Peter M, MD    Family History Family History  Problem Relation Age of Onset  . Hypertension Mother   . Diabetes Mother   . Stroke Father   . Hypertension Father   . Hypertension Brother     Social History Social History   Tobacco Use  . Smoking status: Former Smoker    Packs/day: 4.00    Years: 20.00    Pack years: 80.00    Last attempt to quit: 06/09/1985    Years since quitting: 32.2  . Smokeless tobacco: Former Engineer, water Use Topics  . Alcohol use: No  . Drug use: No     Allergies   Ambien [zolpidem tartrate]; Amoxicillin; and Penicillins   Review of Systems Review of Systems  Constitutional: Negative for chills and fever.  HENT: Positive for congestion, rhinorrhea and sore throat. Negative for ear pain.   Eyes: Negative for pain and visual disturbance.  Respiratory: Positive for cough. Negative for shortness of breath.   Cardiovascular: Negative for chest pain and palpitations.  Gastrointestinal: Negative for abdominal pain and vomiting.  Genitourinary: Negative for dysuria and hematuria.  Musculoskeletal: Negative for arthralgias and back pain.  Skin: Negative for color change and rash.  Neurological: Negative for seizures and syncope.  All other systems reviewed and are negative.    Physical Exam Triage Vital Signs ED Triage Vitals  Enc Vitals Group     BP 09/05/17 1521 (!) 143/82     Pulse Rate 09/05/17 1521 72     Resp --      Temp 09/05/17 1521 98.7 F (37.1  C)     Temp Source 09/05/17 1521 Oral     SpO2 09/05/17 1521 99 %     Weight --      Height --      Head Circumference --      Peak Flow --      Pain Score 09/05/17 1517 6     Pain Loc --      Pain Edu? --      Excl. in GC? --    No data found.  Updated Vital Signs  BP (!) 143/82 (BP Location: Left Arm)   Pulse 72   Temp 98.7 F (37.1 C) (Oral)   SpO2 99%   Visual Acuity Right Eye Distance:   Left Eye Distance:   Bilateral Distance:    Right Eye Near:   Left Eye Near:    Bilateral Near:     Physical Exam  Constitutional: He is oriented to person, place, and time. He appears well-developed and well-nourished.  HENT:  Head: Normocephalic.  Mouth/Throat: Posterior oropharyngeal erythema present.  Neck: Normal range of motion.  Pulmonary/Chest: Effort normal.  Musculoskeletal: Normal range of motion.  Neurological: He is alert and oriented to person, place, and time.  Skin: Skin is dry.  Psychiatric: He has a normal mood and affect.  Nursing note and vitals reviewed.    UC Treatments / Results  Labs (all labs ordered are listed, but only abnormal results are displayed) Labs Reviewed - No data to display  EKG None Radiology No results found.  Procedures Procedures (including critical care time)  Medications Ordered in UC Medications  dexamethasone (DECADRON) injection 10 mg (has no administration in time range)     Initial Impression / Assessment and Plan / UC Course  I have reviewed the triage vital signs and the nursing notes.  Pertinent labs & imaging results that were available during my care of the patient were reviewed by me and considered in my medical decision making (see chart for details).       Final Clinical Impressions(s) / UC Diagnoses   Final diagnoses:  None    ED Discharge Orders    None       Controlled Substance Prescriptions [place] Controlled Substance Registry consulted? Not Applicable   Alene Mires,  NP 09/05/17 1634    Alene Mires, NP 09/05/17 [phone]

## 2017-09-05 NOTE — ED Triage Notes (Signed)
Pt reports sore throat, cough, and sinus congestion x1 week.

## 2017-09-08 ENCOUNTER — Ambulatory Visit (INDEPENDENT_AMBULATORY_CARE_PROVIDER_SITE_OTHER): Payer: 59

## 2017-09-08 ENCOUNTER — Ambulatory Visit: Payer: 59 | Admitting: Urgent Care

## 2017-09-08 ENCOUNTER — Encounter: Payer: Self-pay | Admitting: Urgent Care

## 2017-09-08 VITALS — BP 128/76 | HR 80 | Temp 98.5°F | Resp 18 | Ht 74.0 in | Wt 233.8 lb

## 2017-09-08 DIAGNOSIS — R059 Cough, unspecified: Secondary | ICD-10-CM

## 2017-09-08 DIAGNOSIS — R0789 Other chest pain: Secondary | ICD-10-CM | POA: Diagnosis not present

## 2017-09-08 DIAGNOSIS — R05 Cough: Secondary | ICD-10-CM

## 2017-09-08 MED ORDER — PREDNISONE 20 MG PO TABS: ORAL_TABLET | ORAL | 0 refills | Status: DC

## 2017-09-08 NOTE — Patient Instructions (Signed)
Cough, Adult A cough helps to clear your throat and lungs. A cough may last only 2-3 weeks (acute), or it may last longer than 8 weeks (chronic). Many different things can cause a cough. A cough may be a sign of an illness or another medical condition. Follow these instructions at home:  Pay attention to any changes in your cough.  Take medicines only as told by your doctor. ? If you were prescribed an antibiotic medicine, take it as told by your doctor. Do not stop taking it even if you start to feel better. ? Talk with your doctor before you try using a cough medicine.  Drink enough fluid to keep your pee (urine) clear or pale yellow.  If the air is dry, use a cold steam vaporizer or humidifier in your home.  Stay away from things that make you cough at work or at home.  If your cough is worse at night, try using extra pillows to raise your head up higher while you sleep.  Do not smoke, and try not to be around smoke. If you need help quitting, ask your doctor.  Do not have caffeine.  Do not drink alcohol.  Rest as needed. Contact a doctor if:  You have new problems (symptoms).  You cough up yellow fluid (pus).  Your cough does not get better after 2-3 weeks, or your cough gets worse.  Medicine does not help your cough and you are not sleeping well.  You have pain that gets worse or pain that is not helped with medicine.  You have a fever.  You are losing weight and you do not know why.  You have night sweats. Get help right away if:  You cough up blood.  You have trouble breathing.  Your heartbeat is very fast. This information is not intended to replace advice given to you by your health care provider. Make sure you discuss any questions you have with your health care provider. Document Released: 02/06/2011 Document Revised: 11/01/2015 Document Reviewed: 08/02/2014 Elsevier Interactive Patient Education  2018 Elsevier Inc.     IF you received an x-ray  today, you will receive an invoice from [facility]. Please contact [facility] at [phone] with questions or concerns regarding your invoice.   IF you received labwork today, you will receive an invoice from [facility]. Please contact [facility] at [phone] with questions or concerns regarding your invoice.   Our billing staff will not be able to assist you with questions regarding bills from these companies.  You will be contacted with the lab results as soon as they are available. The fastest way to get your results is to activate your My Chart account. Instructions are located on the last page of this paperwork. If you have not heard from us regarding the results in 2 weeks, please contact this office.      

## 2017-09-08 NOTE — Progress Notes (Signed)
    MRN: [id number] DOB: 20-Jul-1955  Subjective:   Michael Hartman is a 62 y.o. male presenting for 1 week history of productive cough, coughing fits. Cough elicits chest pain, throat pain. Denies fever, n/v, abdominal pain. Denies smoking cigarettes, quit many years ago. He was [facility] and was started on Levaquin, had IM Decadron. Reports minimal improvement. Patient has chronic back pain, takes Kadian, oxycodone. Denies history of bad allergies.   Sherrine MaplesGlenn has a current medication list which includes the following prescription(s): atorvastatin, canagliflozin, diltiazem, eliquis, fluticasone, levofloxacin, loratadine, metformin, methocarbamol, metoprolol tartrate, mirtazapine, morphine, multivitamin, nitroglycerin, one touch ultra test, oxycodone-acetaminophen, tradjenta, and diltiazem. Also is allergic to [employer] Corporationambien [zolpidem tartrate]; amoxicillin; and penicillins.  Sherrine MaplesGlenn  has a past medical history of Allergy, Arthritis, Atrial flutter (HCC), Celiac disease, Chronic rhinitis, Coronary artery disease, Diabetes mellitus without complication (HCC), GERD (gastroesophageal reflux disease), History of migraine headaches, Hypercholesteremia, Hypertension, and OSA (obstructive sleep apnea). Also  has a past surgical history that includes Cardiac catheterization (06/06/1997); Coronary stent placement; Nasal sinus surgery; Cardiovascular stress test (12/07/2007); Eye surgery; Spine surgery; Shoulder acromioplasty; Knee Arthroplasty; Ankle arthroplasty; Elbow Arthroplasty; and Cardioversion (N/A, 01/08/2017).  Objective:   Vitals: BP 128/76   Pulse 80   Temp 98.5 F (36.9 C) (Oral)   Resp 18   Ht 6\' 2"  (1.88 m)   Wt 233 lb 12.8 oz (106.1 kg)   SpO2 97%   BMI 30.02 kg/m   Physical Exam  Constitutional: He is oriented to person, place, and time. He appears well-developed and well-nourished.  HENT:  Mouth/Throat: Oropharynx is clear and moist.  Eyes: No scleral icterus.  Neck: Normal range  of motion. Neck supple.  Cardiovascular: Normal rate, regular rhythm and intact distal pulses. Exam reveals no gallop and no friction rub.  No murmur heard. Pulmonary/Chest: No respiratory distress. He has no wheezes. He has no rales.  Diminished lung sounds, cannot take full breath without a coughing fit.   Lymphadenopathy:    He has no cervical adenopathy.  Neurological: He is alert and oriented to person, place, and time.  Skin: Skin is warm and dry.  Psychiatric: He has a normal mood and affect.   Dg Chest 2 View  Result Date: 09/08/2017 CLINICAL DATA:  Cough.  Chest pain. EXAM: CHEST - 2 VIEW COMPARISON:  Two-view chest x-ray 07/19/14 FINDINGS: The heart size and mediastinal contours are within normal limits. Both lungs are clear. The visualized skeletal structures are unremarkable. IMPRESSION: No active cardiopulmonary disease. Electronically Signed   By: Marin Robertshristopher  Mattern M.D.   On: 09/08/2017 12:19     Assessment and Plan :   Cough - Plan: DG Chest 2 View  Atypical chest pain - Plan: DG Chest 2 View  Will have patient finish Levaquin, start prednisone course. Return-to-clinic precautions discussed, patient verbalized understanding.   Wallis BambergMario Lorisa Scheid, PA-C Primary [facility] at [facility] [phone] 09/08/2017  11:53 AM

## 2017-09-16 ENCOUNTER — Ambulatory Visit: Payer: 59 | Admitting: Emergency Medicine

## 2017-09-16 ENCOUNTER — Encounter: Payer: Self-pay | Admitting: Physician Assistant

## 2017-09-16 ENCOUNTER — Encounter: Payer: Self-pay | Admitting: Emergency Medicine

## 2017-09-16 VITALS — BP 136/83 | HR 68 | Temp 98.7°F | Resp 17 | Ht 73.5 in | Wt 236.0 lb

## 2017-09-16 DIAGNOSIS — R05 Cough: Secondary | ICD-10-CM | POA: Diagnosis not present

## 2017-09-16 DIAGNOSIS — R059 Cough, unspecified: Secondary | ICD-10-CM

## 2017-09-16 MED ORDER — BUDESONIDE-FORMOTEROL FUMARATE 160-4.5 MCG/ACT IN AERO
2.0000 | INHALATION_SPRAY | Freq: Two times a day (BID) | RESPIRATORY_TRACT | 12 refills | Status: DC
Start: 2017-09-16 — End: 2017-09-16

## 2017-09-16 MED ORDER — BENZONATATE 200 MG PO CAPS: 200.0000 mg | ORAL_CAPSULE | Freq: Two times a day (BID) | ORAL | 0 refills | Status: DC | PRN

## 2017-09-16 MED ORDER — BUDESONIDE-FORMOTEROL FUMARATE 160-4.5 MCG/ACT IN AERO
1.0000 | INHALATION_SPRAY | Freq: Two times a day (BID) | RESPIRATORY_TRACT | 12 refills | Status: DC
Start: 2017-09-16 — End: 2018-02-24

## 2017-09-16 NOTE — Assessment & Plan Note (Signed)
Persistent cough mostly at night.  None during the exam.  Ex-smoker.  Chest x-ray suggestive of COPD.  Will start treatment and refer to pulmonary for further evaluation.  Suspect COPD.

## 2017-09-16 NOTE — Patient Instructions (Addendum)
     IF you received an x-ray today, you will receive an invoice from [facility]. Please contact [facility] at [phone] with questions or concerns regarding your invoice.   IF you received labwork today, you will receive an invoice from [facility]. Please contact [facility] at [phone] with questions or concerns regarding your invoice.   Our billing staff will not be able to assist you with questions regarding bills from these companies.  You will be contacted with the lab results as soon as they are available. The fastest way to get your results is to activate your My Chart account. Instructions are located on the last page of this paperwork. If you have not heard from us regarding the results in 2 weeks, please contact this office.     Cough, Adult A cough helps to clear your throat and lungs. A cough may last only 2-3 weeks (acute), or it may last longer than 8 weeks (chronic). Many different things can cause a cough. A cough may be a sign of an illness or another medical condition. Follow these instructions at home:  Pay attention to any changes in your cough.  Take medicines only as told by your doctor. ? If you were prescribed an antibiotic medicine, take it as told by your doctor. Do not stop taking it even if you start to feel better. ? Talk with your doctor before you try using a cough medicine.  Drink enough fluid to keep your pee (urine) clear or pale yellow.  If the air is dry, use a cold steam vaporizer or humidifier in your home.  Stay away from things that make you cough at work or at home.  If your cough is worse at night, try using extra pillows to raise your head up higher while you sleep.  Do not smoke, and try not to be around smoke. If you need help quitting, ask your doctor.  Do not have caffeine.  Do not drink alcohol.  Rest as needed. Contact a doctor if:  You have new problems (symptoms).  You cough up yellow fluid  (pus).  Your cough does not get better after 2-3 weeks, or your cough gets worse.  Medicine does not help your cough and you are not sleeping well.  You have pain that gets worse or pain that is not helped with medicine.  You have a fever.  You are losing weight and you do not know why.  You have night sweats. Get help right away if:  You cough up blood.  You have trouble breathing.  Your heartbeat is very fast. This information is not intended to replace advice given to you by your health care provider. Make sure you discuss any questions you have with your health care provider. Document Released: 02/06/2011 Document Revised: 11/01/2015 Document Reviewed: 08/02/2014 Elsevier Interactive Patient Education  2018 Elsevier Inc.  

## 2017-09-16 NOTE — Progress Notes (Signed)
Michael Hartman 62 y.o.   Chief Complaint  Patient presents with  . Follow-up    cough    HISTORY OF PRESENT ILLNESS: This is a 62 y.o. male complaining of persistent cough.  Seen here on 09/08/2017 with the same.  Just finished Levaquin.  Ex smoker.  Dry cough.  Had a chest x-ray on 09/08/2017.  No pneumonia.  At times it feels like an asthma attack.  No history of COPD.  Denies difficulty breathing.  HPI   Prior to Admission medications   Medication Sig Start Date End Date Taking? Authorizing Provider  atorvastatin (LIPITOR) 80 MG tablet Take 1 tablet (80 mg total) by mouth daily. 04/23/12  Yes [place], Peter Hartman, Hartman  Canagliflozin [facility](INVOKANA) 300 MG TABS Take by mouth. 1 tab daily   Yes Provider, Historical, Hartman  diltiazem (DILACOR XR) 180 MG 24 hr capsule Take 180 mg by mouth daily.   Yes Provider, Historical, Hartman  ELIQUIS 5 MG TABS tablet TAKE 1 TABLET BY MOUTH  TWICE A DAY 09/07/17  Yes [place], Peter Hartman, Hartman  fluticasone [facility](FLONASE) 50 MCG/ACT nasal spray Place 2 sprays into both nostrils daily. 01/23/17  Yes Ethelda ChickSmith, Michael Hartman  levofloxacin (LEVAQUIN) 750 MG tablet Take 1 tablet (750 mg total) by mouth daily. 09/05/17  Yes Alene Miresmohundro, Michael Hartman  loratadine (CLARITIN) 10 MG tablet Take 1 tablet (10 mg total) by mouth daily. 01/23/17  Yes Ethelda ChickSmith, Michael Hartman  metFORMIN (GLUCOPHAGE) 500 MG tablet Take 500 mg by mouth 2 (two) times daily.     Yes Provider, Historical, Hartman  methocarbamol (ROBAXIN) 500 MG tablet Take 1,000 mg by mouth 3 (three) times daily. Take as needed (unsure of dosage)   Yes Provider, Historical, Hartman  metoprolol tartrate (LOPRESSOR) 50 MG tablet TAKE 1 TABLET BY MOUTH  TWICE A DAY 05/28/17  Yes [place], Peter Hartman, Hartman  mirtazapine (REMERON) 45 MG tablet Take 1 tablet by mouth at bedtime. Take 1 tab daily at bedtime 04/03/15  Yes Provider, Historical, Hartman  morphine (KADIAN) 50 MG 24 hr capsule Take 1 capsule by mouth every 12 (twelve) hours. Take 1 tab every 12 hours 04/05/15  Yes  Provider, Historical, Hartman  Multiple Vitamin (MULTIVITAMIN) capsule Take 1 capsule by mouth daily.     Yes Provider, Historical, Hartman  nitroGLYCERIN (NITROSTAT) 0.4 MG SL tablet Place 1 tablet (0.4 mg total) under the tongue every 5 (five) minutes as needed for chest pain. 05/05/17  Yes [place], Peter Hartman, Hartman  ONE TOUCH ULTRA TEST test strip 1 each by Other route as needed. Use 1 strip to check glucose daily 01/23/17  Yes Ethelda ChickSmith, Michael Hartman  oxyCODONE-acetaminophen (PERCOCET) 10-325 MG per tablet Take 1 tablet by mouth 3 (three) times daily.     Yes Provider, Historical, Hartman  predniSONE (DELTASONE) 20 MG tablet Take 2 tablets daily with breakfast. 09/08/17  Yes Michael Hartman, Michael Hartman  TRADJENTA 5 MG TABS tablet 5 mg daily.  03/03/12  Yes Provider, Historical, Hartman  diltiazem (CARDIZEM CD) 180 MG 24 hr capsule Take 1 capsule (180 mg total) by mouth daily. 02/02/17 05/03/17  [place], Peter Hartman, Hartman    Allergies  Allergen Reactions  . Ambien [Zolpidem Tartrate]     Amnesia, odd behavior  . Amoxicillin   . Penicillins     Child hood    Patient Active Problem List   Diagnosis Date Noted  . Typical atrial flutter (HCC) 01/05/2017  . Current use of long term anticoagulation 01/05/2017  .  Diabetes mellitus type 2 in obese (HCC) 03/23/2012  . HYPERCHOLESTEROLEMIA 08/02/2007  . OSA (obstructive sleep apnea) 08/02/2007  . Essential hypertension 08/02/2007  . CAD (coronary artery disease) 08/02/2007  . RHINITIS, CHRONIC 08/02/2007    Past Medical History:  Diagnosis Date  . Allergy   . Arthritis   . Atrial flutter (HCC)    a. diagnosed in 11/2016 --> started on Eliquis for anticoagulation b. s/p DCCV in 01/2017  . Celiac disease   . Chronic rhinitis   . Coronary artery disease    a. s/p remote stenting of RCA in 1998  . Diabetes mellitus without complication (HCC)   . GERD (gastroesophageal reflux disease)   . History of migraine headaches   . Hypercholesteremia   . Hypertension   . OSA (obstructive sleep  apnea)     Past Surgical History:  Procedure Laterality Date  . ANKLE ARTHROPLASTY    . CARDIAC CATHETERIZATION  06/06/1997   EF 65%  . CARDIOVASCULAR STRESS TEST  12/07/2007   EF 58%  . CARDIOVERSION N/A 01/08/2017   Procedure: CARDIOVERSION;  Surgeon: Wendall Stade, Hartman;  Location: [facility] ENDOSCOPY;  Service: Cardiovascular;  Laterality: N/A;  . CORONARY STENT PLACEMENT    . ELBOW ARTHROPLASTY    . EYE SURGERY    . KNEE ARTHROPLASTY    . NASAL SINUS SURGERY    . SHOULDER ACROMIOPLASTY    . SPINE SURGERY      Social History   Socioeconomic History  . Marital status: Married    Spouse name: Joyce Gross  . Number of children: 0  . Years of education: Not on file  . Highest education level: Not on file  Occupational History  . 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 Financial resource strain: Not on file  . Food insecurity:    Worry: Not on file    Inability: Not on file  . Transportation needs:    Medical: Not on file    Non-medical: Not on file  Tobacco Use  . Smoking status: Former Smoker    Packs/day: 4.00    Years: 20.00    Pack years: 80.00    Last attempt to quit: 06/09/1985    Years since quitting: 32.2  . Smokeless tobacco: Former Engineer, water and Sexual Activity  . Alcohol use: No  . Drug use: No  . Sexual activity: Yes    Partners: Female    Birth control/protection: None  Lifestyle  . Physical activity:    Days per week: Not on file    Minutes per session: Not on file  . Stress: Not on file  Relationships  . Social connections:    Talks on phone: Not on file    Gets together: Not on file    Attends religious service: Not on file    Active member of club or organization: Not on file    Attends meetings of clubs or organizations: Not on file    Relationship status: Not on file  . Intimate partner violence:    Fear of current or ex partner: Not on file    Emotionally abused: Not on file    Physically abused:  Not on file    Forced sexual activity: Not on file  Other Topics Concern  . Not on file  Social History Narrative   Lives with his wife and their cat.    Family History  Problem Relation Age of Onset  . Hypertension  Mother   . Diabetes Mother   . Stroke Father   . Hypertension Father   . Hypertension Brother      Review of Systems  Constitutional: Negative.  Negative for chills and fever.  HENT: Negative.  Negative for sore throat.   Eyes: Negative.   Respiratory: Positive for cough. Negative for hemoptysis, sputum production and shortness of breath.   Cardiovascular: Negative.  Negative for chest pain and palpitations.  Gastrointestinal: Negative.  Negative for abdominal pain, diarrhea, nausea and vomiting.  Genitourinary: Negative.  Negative for dysuria and hematuria.  Musculoskeletal: Positive for back pain (Chronic). Negative for myalgias and neck pain.  Skin: Negative.  Negative for rash.  Neurological: Negative.  Negative for dizziness and headaches.  Endo/Heme/Allergies: Negative.     Vitals:   09/16/17 1339  BP: 136/83  Pulse: 68  Resp: 17  Temp: 98.7 F (37.1 C)  SpO2: 98%    Physical Exam  Constitutional: He is oriented to person, place, and time. He appears well-developed and well-nourished.  HENT:  Head: Normocephalic and atraumatic.  Eyes: Pupils are equal, round, and reactive to light. Conjunctivae and EOM are normal.  Neck: Normal range of motion. Neck supple. No JVD present.  Cardiovascular: Normal rate, regular rhythm and normal heart sounds.  Pulmonary/Chest: Effort normal and breath sounds normal.  Abdominal: Soft. Bowel sounds are normal. He exhibits no distension. There is no tenderness.  Musculoskeletal: Normal range of motion.  Lymphadenopathy:    He has no cervical adenopathy.  Neurological: He is alert and oriented to person, place, and time. No sensory deficit. He exhibits normal muscle tone.  Skin: Skin is warm and dry. Capillary  refill takes less than 2 seconds.  Psychiatric: He has a normal mood and affect. His behavior is normal.  Vitals reviewed.  Chest x-ray reviewed with patient in the room.  COPD chronic changes noted.  Cough Persistent cough mostly at night.  None during the exam.  Ex-smoker.  Chest x-ray suggestive of COPD.  Will start treatment and refer to pulmonary for further evaluation.  Suspect COPD.   ASSESSMENT & PLAN: Michael Hartman was seen today for follow-up.  Diagnoses and all orders for this visit:  Cough Comments: Suspected COPD Orders: -     benzonatate (TESSALON) 200 MG capsule; Take 1 capsule (200 mg total) by mouth 2 (two) times daily as needed for cough. -     Discontinue: budesonide-formoterol (SYMBICORT) 160-4.5 MCG/ACT inhaler; Inhale 2 puffs into the lungs 2 (two) times daily. -     Ambulatory referral to Pulmonology -     budesonide-formoterol (SYMBICORT) 160-4.5 MCG/ACT inhaler; Inhale 1 puff into the lungs 2 (two) times daily.    Patient Instructions       IF you received an x-ray today, you will receive an invoice from [facility]. Please contact [facility] at [phone] with questions or concerns regarding your invoice.   IF you received labwork today, you will receive an invoice from [facility]. Please contact [facility] at [phone] with questions or concerns regarding your invoice.   Our billing staff will not be able to assist you with questions regarding bills from these companies.  You will be contacted with the lab results as soon as they are available. The fastest way to get your results is to activate your My Chart account. Instructions are located on the last page of this paperwork. If you have not heard from [place] regarding the results in 2 weeks, please contact this office.  Cough, Adult A cough helps to clear your throat and lungs. A cough may last only 2-3 weeks (acute), or it may last longer than 8 weeks (chronic). Many different  things can cause a cough. A cough may be a sign of an illness or another medical condition. Follow these instructions at home:  Pay attention to any changes in your cough.  Take medicines only as told by your doctor. ? If you were prescribed an antibiotic medicine, take it as told by your doctor. Do not stop taking it even if you start to feel better. ? Talk with your doctor before you try using a cough medicine.  Drink enough fluid to keep your pee (urine) clear or pale yellow.  If the air is dry, use a cold steam vaporizer or humidifier in your home.  Stay away from things that make you cough at work or at home.  If your cough is worse at night, try using extra pillows to raise your head up higher while you sleep.  Do not smoke, and try not to be around smoke. If you need help quitting, ask your doctor.  Do not have caffeine.  Do not drink alcohol.  Rest as needed. Contact a doctor if:  You have new problems (symptoms).  You cough up yellow fluid (pus).  Your cough does not get better after 2-3 weeks, or your cough gets worse.  Medicine does not help your cough and you are not sleeping well.  You have pain that gets worse or pain that is not helped with medicine.  You have a fever.  You are losing weight and you do not know why.  You have night sweats. Get help right away if:  You cough up blood.  You have trouble breathing.  Your heartbeat is very fast. This information is not intended to replace advice given to you by your health care provider. Make sure you discuss any questions you have with your health care provider. Document Released: 02/06/2011 Document Revised: 11/01/2015 Document Reviewed: 08/02/2014 Elsevier Interactive Patient Education  2018 Elsevier Inc.      Edwina Barth, Hartman [facility]

## 2017-11-03 ENCOUNTER — Encounter: Payer: Self-pay | Admitting: Family Medicine

## 2017-11-13 ENCOUNTER — Telehealth: Payer: Self-pay | Admitting: Cardiology

## 2017-11-13 NOTE — Telephone Encounter (Signed)
New Message    Pt c/o medication issue:  1. Name of Medication: diltiazem (DILACOR XR) 180 MG 24 hr capsule  2. How are you currently taking this medication (dosage and times per day)?   3. Are you having a reaction (difficulty breathing--STAT)?   4. What is your medication issue? [facility] is calling on behalf of patient to confirm whether or not he should still be taking the Diltiazem. Please call.

## 2017-11-13 NOTE — Telephone Encounter (Signed)
Pharmacy called and stated that they were notified by patients PCP to DC the diltiazem 180mg , due to the fact that patient was taking Tribenzor 40-5-12.5mg . According to the pharmacy patient has been on Tribenzor for 2 years or more, and has currently been taking both the Diltiazem and Tribenzor. Pharmacy is wanting clarification on if okay to stop Diltiazem.   Please advise. Thank you!

## 2017-11-14 NOTE — Telephone Encounter (Signed)
It is OK to stop the diltiazem. According to our Records Tribenzor was stopped when he developed Afib in July 2018 and he was started on Diltiazem. Apparently he never stopped it. DC diltiazem. Need to update medical record to reflect addition of Tribenzor.  Krisanne Lich [place] MD, [facility]

## 2017-11-17 NOTE — Telephone Encounter (Signed)
Called [facility] but was unable to speak with Claris CheMargaret who has been working on this med issue. Explained to [facility] rep that if patient has been taking diltiazem as directed he should continue this. Called patient who reports he stopped tribenzor in July 2018 as directed and started on diltiazem ONLY. He thinks that his PCP office was refilling tribenzor for him, which he states he recently got a shipment of. Advised patient to continue his medications as prescribed and I will notify MD that he is following cardiology's instructions concerning diltiazem. [facility] staff did request call back with Dr. Elvis CoilJordan's final OK on medications

## 2017-11-17 NOTE — Telephone Encounter (Signed)
Follow up     Claris CheMargaret with [facility] is calling to follow up about the patients diliatzem. Claris CheMargaret states that the patient never started the Tribenzor has only been taken the Diltiazem. Use Reference number [id number]

## 2017-11-17 NOTE — Telephone Encounter (Signed)
Agree should stay on diltiazem and do not take Tribenzor.  Karina Nofsinger [place] MD, [facility]

## 2017-11-17 NOTE — Telephone Encounter (Signed)
Called [facility] with update on medications. They will remove tribenzor from his profile

## 2017-11-18 ENCOUNTER — Other Ambulatory Visit: Payer: Self-pay | Admitting: Cardiology

## 2017-11-18 DIAGNOSIS — I480 Paroxysmal atrial fibrillation: Secondary | ICD-10-CM

## 2017-12-09 ENCOUNTER — Other Ambulatory Visit: Payer: Self-pay | Admitting: Cardiology

## 2017-12-09 DIAGNOSIS — I480 Paroxysmal atrial fibrillation: Secondary | ICD-10-CM

## 2018-02-11 ENCOUNTER — Other Ambulatory Visit: Payer: Self-pay | Admitting: Cardiology

## 2018-02-11 DIAGNOSIS — I480 Paroxysmal atrial fibrillation: Secondary | ICD-10-CM

## 2018-02-15 ENCOUNTER — Other Ambulatory Visit: Payer: Self-pay

## 2018-02-15 DIAGNOSIS — I480 Paroxysmal atrial fibrillation: Secondary | ICD-10-CM

## 2018-02-15 MED ORDER — METOPROLOL TARTRATE 50 MG PO TABS: 50.0000 mg | ORAL_TABLET | Freq: Two times a day (BID) | ORAL | 0 refills | Status: DC

## 2018-02-22 ENCOUNTER — Other Ambulatory Visit: Payer: Self-pay

## 2018-02-22 DIAGNOSIS — I480 Paroxysmal atrial fibrillation: Secondary | ICD-10-CM

## 2018-02-22 MED ORDER — DILTIAZEM HCL ER COATED BEADS 180 MG PO CP24
180.0000 mg | ORAL_CAPSULE | Freq: Every day | ORAL | 0 refills | Status: DC
Start: 2018-02-22 — End: 2018-03-25

## 2018-02-23 NOTE — Progress Notes (Signed)
Cardiology Office Note    Date:  02/24/2018   ID:  Wahid, Holley 1955-09-09, MRN [id number]  PCP:  Gaspar Garbe, MD  Cardiologist: Dr. [place]   Chief Complaint  Patient presents with  . Coronary Artery Disease  . Atrial Fibrillation    History of Present Illness:    Michael Hartman is a 62 y.o. male with past medical history of CAD (s/p stenting of RCA in 1998), PAF (on Eliquis), HTN, HLD, Type 2 DM, and OSA (on CPAP) who presents to the office today for follow-up of atrial fibrillation and CAD.   He was diagnosed with Afib in June of 2018. Despite good rate control he remained symptomatic with fatigue.  He underwent DCCV x2 with 150J and 200J with conversion to NSR in August 2018.   On follow up today he is feeling very well. His energy level is good. Denies any recurrent AFib, palpitations, chest pain, or dyspnea. Main activity is push mowing the lawn. Goes to [employer] sporadically.   Past Medical History:  Diagnosis Date  . Allergy   . Arthritis   . Atrial flutter (HCC)    a. diagnosed in 11/2016 --> started on Eliquis for anticoagulation b. s/p DCCV in 01/2017  . Celiac disease   . Chronic rhinitis   . Coronary artery disease    a. s/p remote stenting of RCA in 1998  . Diabetes mellitus without complication (HCC)   . GERD (gastroesophageal reflux disease)   . History of migraine headaches   . Hypercholesteremia   . Hypertension   . OSA (obstructive sleep apnea)     Past Surgical History:  Procedure Laterality Date  . ANKLE ARTHROPLASTY    . CARDIAC CATHETERIZATION  06/06/1997   EF 65%  . CARDIOVASCULAR STRESS TEST  12/07/2007   EF 58%  . CARDIOVERSION N/A 01/08/2017   Procedure: CARDIOVERSION;  Surgeon: Wendall Stade, MD;  Location: [facility] ENDOSCOPY;  Service: Cardiovascular;  Laterality: N/A;  . CORONARY STENT PLACEMENT    . ELBOW ARTHROPLASTY    . EYE SURGERY    . KNEE ARTHROPLASTY    . NASAL SINUS SURGERY    . SHOULDER ACROMIOPLASTY    .  SPINE SURGERY      Current Medications: Outpatient Medications Prior to Visit  Medication Sig Dispense Refill  . atorvastatin (LIPITOR) 80 MG tablet Take 1 tablet (80 mg total) by mouth daily. 90 tablet 0  . Canagliflozin (INVOKANA) 300 MG TABS Take by mouth. 1 tab daily    . diltiazem (CARDIZEM CD) 180 MG 24 hr capsule Take 1 capsule (180 mg total) by mouth daily. Please contact our office for an appointment for continuation of future refills. 90 capsule 0  . ELIQUIS 5 MG TABS tablet TAKE 1 TABLET BY MOUTH  TWICE A DAY 180 tablet 1  . fluticasone (FLONASE) 50 MCG/ACT nasal spray Place 2 sprays into both nostrils daily. 16 g 11  . loratadine (CLARITIN) 10 MG tablet Take 1 tablet (10 mg total) by mouth daily. 30 tablet 11  . metFORMIN (GLUCOPHAGE) 500 MG tablet Take 500 mg by mouth 2 (two) times daily.      . methocarbamol (ROBAXIN) 500 MG tablet Take 1,000 mg by mouth 3 (three) times daily. Take as needed (unsure of dosage)    . metoprolol tartrate (LOPRESSOR) 50 MG tablet Take 1 tablet (50 mg total) by mouth 2 (two) times daily. 60 tablet 0  . mirtazapine (REMERON) 45 MG tablet Take  1 tablet by mouth at bedtime. Take 1 tab daily at bedtime    . morphine (KADIAN) 50 MG 24 hr capsule Take 1 capsule by mouth every 12 (twelve) hours. Take 1 tab every 12 hours  0  . Multiple Vitamin (MULTIVITAMIN) capsule Take 1 capsule by mouth daily.      . nitroGLYCERIN (NITROSTAT) 0.4 MG SL tablet Place 1 tablet (0.4 mg total) under the tongue every 5 (five) minutes as needed for chest pain. 25 tablet 11  . ONE TOUCH ULTRA TEST test strip 1 each by Other route as needed. Use 1 strip to check glucose daily 100 each 4  . oxyCODONE-acetaminophen (PERCOCET) 10-325 MG per tablet Take 1 tablet by mouth 3 (three) times daily.      . TRADJENTA 5 MG TABS tablet 5 mg daily.     . benzonatate (TESSALON) 200 MG capsule Take 1 capsule (200 mg total) by mouth 2 (two) times daily as needed for cough. 20 capsule 0  .  budesonide-formoterol (SYMBICORT) 160-4.5 MCG/ACT inhaler Inhale 1 puff into the lungs 2 (two) times daily. 1 Inhaler 12  . diltiazem (DILACOR XR) 180 MG 24 hr capsule Take 180 mg by mouth daily.    Marland Kitchen levofloxacin (LEVAQUIN) 750 MG tablet Take 1 tablet (750 mg total) by mouth daily. 5 tablet 0  . predniSONE (DELTASONE) 20 MG tablet Take 2 tablets daily with breakfast. 10 tablet 0   No facility-administered medications prior to visit.      Allergies:   Ambien [zolpidem tartrate]; Amoxicillin; and Penicillins   Social History   Socioeconomic History  . Marital status: Married    Spouse name: Joyce Gross  . Number of children: 0  . Years of education: Not on file  . Highest education level: Not on file  Occupational History  . 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 Financial resource strain: Not on file  . Food insecurity:    Worry: Not on file    Inability: Not on file  . Transportation needs:    Medical: Not on file    Non-medical: Not on file  Tobacco Use  . Smoking status: Former Smoker    Packs/day: 4.00    Years: 20.00    Pack years: 80.00    Last attempt to quit: 06/09/1985    Years since quitting: 32.7  . Smokeless tobacco: Former Engineer, water and Sexual Activity  . Alcohol use: No  . Drug use: No  . Sexual activity: Yes    Partners: Female    Birth control/protection: None  Lifestyle  . Physical activity:    Days per week: Not on file    Minutes per session: Not on file  . Stress: Not on file  Relationships  . Social connections:    Talks on phone: Not on file    Gets together: Not on file    Attends religious service: Not on file    Active member of club or organization: Not on file    Attends meetings of clubs or organizations: Not on file    Relationship status: Not on file  Other Topics Concern  . Not on file  Social History Narrative   Lives with his wife and their cat.     Family History:  The patient's  family history includes Diabetes in his mother; Hypertension in his brother, father, and mother; Stroke in his father.   Review of Systems:   Please see the  history of present illness.    All other systems reviewed and are otherwise negative except as noted above.   Physical Exam:    VS:  BP 128/82   Pulse 63   Ht 6' 1.5" (1.867 m)   Wt 240 lb 3.2 oz (109 kg)   BMI 31.26 kg/m    GENERAL:  Well appearing, obese WM in NAD HEENT:  PERRL, EOMI, sclera are clear. Oropharynx is clear. NECK:  No jugular venous distention, carotid upstroke brisk and symmetric, no bruits, no thyromegaly or adenopathy LUNGS:  Clear to auscultation bilaterally CHEST:  Unremarkable HEART:  RRR,  PMI not displaced or sustained,S1 and S2 within normal limits, no S3, no S4: no clicks, no rubs, no murmurs ABD:  Soft, nontender. BS +, no masses or bruits. No hepatomegaly, no splenomegaly EXT:  2 + pulses throughout, no edema, no cyanosis no clubbing SKIN:  Warm and dry.  No rashes NEURO:  Alert and oriented x 3. Cranial nerves II through XII intact. PSYCH:  Cognitively intact      Wt Readings from Last 3 Encounters:  02/24/18 240 lb 3.2 oz (109 kg)  09/16/17 236 lb (107 kg)  09/08/17 233 lb 12.8 oz (106.1 kg)     Studies/Labs Reviewed:   Recent Labs: No results found for requested labs within last 8760 hours.   Lipid Panel    Component Value Date/Time   CHOL 136 11/18/2016 1030   TRIG 217 (H) 11/18/2016 1030   HDL 39 (L) 11/18/2016 1030   CHOLHDL 3.5 11/18/2016 1030   LDLCALC 54 11/18/2016 1030   Dated 03/31/17: A1c 7.8%. Hgb and chemistries normal. Dated 09/29/17: glucose 117, otherwise CBC, CMET and PSA normal. Cholesterol 140, triglycerides 172, HDL 37, LDL 69.  Ecg today shows NSR with normal Ecg. I have personally reviewed and interpreted this study.   Additional studies/ records that were reviewed today include:   Echocardiogram: 11/24/2016 Study Conclusions  - Left ventricle: The  cavity size was mildly dilated. There was   mild focal basal hypertrophy of the septum. Systolic function was   normal. The estimated ejection fraction was 55%. Wall motion was   normal; there were no regional wall motion abnormalities. - Aortic valve: Trileaflet; mildly thickened, mildly calcified   leaflets. - Mitral valve: Calcified annulus. - Left atrium: The atrium was moderately dilated. - Right ventricle: The cavity size was mildly dilated. Wall   thickness was normal. - Pulmonic valve: There was trivial regurgitation.    Assessment:    1. Coronary artery disease involving native coronary artery of native heart without angina pectoris   2. Paroxysmal atrial fibrillation (HCC)   3. Essential hypertension   4. HYPERCHOLESTEROLEMIA      Plan:    1. Paroxysmal Atrial Fibrillation/ Use of Long-term Anticoagulation - initially diagnosed with atrial fibrillation back in 11/2016 and is  s/p DCCV on 01/08/2017. - Maintaining NSR - will continue Lopressor and Cardizem CD at current doses. Encourage focus on weight loss and regular aerobic exercise.  - This patients CHA2DS2-VASc Score and unadjusted Ischemic Stroke Rate (% per year) is equal to 3.2 % stroke rate/year from a score of 3 (HTN, Vascular, DM). Continue Eliquis 5mg  BID for anticoagulation.   2. CAD - s/p remote stenting of RCA in 1998. - he is asymptomatic.  - continue BB and statin therapy.   3. HTN - BP is  well controlled.  4. HLD - at goal on lipitor.  - continue Atorvastatin 80mg  daily.  5. OSA - continue CPAP.   Encourage increased aerobic activity. Will follow up in 6 months.   Signed, Ercel Normoyle [place], MD,FACC 02/24/2018 9:34 AM    [facility] [address], [address] [place], [place] [address] Phone: [phone]

## 2018-02-24 ENCOUNTER — Ambulatory Visit: Payer: 59 | Admitting: Cardiology

## 2018-02-24 ENCOUNTER — Encounter: Payer: Self-pay | Admitting: Cardiology

## 2018-02-24 VITALS — BP 128/82 | HR 63 | Ht 73.5 in | Wt 240.2 lb

## 2018-02-24 DIAGNOSIS — I2510 Atherosclerotic heart disease of native coronary artery without angina pectoris: Secondary | ICD-10-CM

## 2018-02-24 DIAGNOSIS — I10 Essential (primary) hypertension: Secondary | ICD-10-CM | POA: Diagnosis not present

## 2018-02-24 DIAGNOSIS — E7800 Pure hypercholesterolemia, unspecified: Secondary | ICD-10-CM | POA: Diagnosis not present

## 2018-02-24 DIAGNOSIS — I480 Paroxysmal atrial fibrillation: Secondary | ICD-10-CM

## 2018-02-24 NOTE — Patient Instructions (Addendum)
Continue your current therapy  I will see you in 6 months.   

## 2018-03-25 ENCOUNTER — Other Ambulatory Visit: Payer: Self-pay | Admitting: Cardiology

## 2018-03-25 DIAGNOSIS — I480 Paroxysmal atrial fibrillation: Secondary | ICD-10-CM

## 2018-04-01 ENCOUNTER — Other Ambulatory Visit: Payer: Self-pay | Admitting: Cardiology

## 2018-04-01 DIAGNOSIS — I480 Paroxysmal atrial fibrillation: Secondary | ICD-10-CM

## 2018-06-14 ENCOUNTER — Other Ambulatory Visit: Payer: Self-pay | Admitting: Cardiology

## 2018-06-14 DIAGNOSIS — I480 Paroxysmal atrial fibrillation: Secondary | ICD-10-CM

## 2018-08-11 ENCOUNTER — Other Ambulatory Visit: Payer: Self-pay | Admitting: Cardiology

## 2018-08-11 DIAGNOSIS — I480 Paroxysmal atrial fibrillation: Secondary | ICD-10-CM

## 2018-08-21 NOTE — Progress Notes (Signed)
Cardiology Office Note    Date:  08/26/2018   ID:  Michael Hartman [birth year], MRN [id number]  PCP:  Gaspar Garbe, MD  Cardiologist: Dr. Swaziland   Chief Complaint  Patient presents with  . Follow-up    pt denied chest pain    History of Present Illness:    Michael Hartman is a 63 y.o. male with past medical history of CAD (s/p stenting of RCA in 1998), PAF (on Eliquis), HTN, HLD, Type 2 DM, and OSA (on CPAP) who presents to the office today for follow-up of atrial fibrillation and CAD.   He was diagnosed with Afib in June of 2018. Despite good rate control he remained symptomatic with fatigue.  He underwent DCCV x2 with 150J and 200J with conversion to NSR in August 2018.   On follow up today he is feeling very well. He had some indigestion several nights ago after eating spicy chicken. Otherwise no chest pain, dyspnea, palpitations.  His energy level is good.   Past Medical History:  Diagnosis Date  . Allergy   . Arthritis   . Atrial flutter (HCC)    a. diagnosed in 11/2016 --> started on Eliquis for anticoagulation b. s/p DCCV in 01/2017  . Celiac disease   . Chronic rhinitis   . Coronary artery disease    a. s/p remote stenting of RCA in 1998  . Diabetes mellitus without complication (HCC)   . GERD (gastroesophageal reflux disease)   . History of migraine headaches   . Hypercholesteremia   . Hypertension   . OSA (obstructive sleep apnea)     Past Surgical History:  Procedure Laterality Date  . ANKLE ARTHROPLASTY    . CARDIAC CATHETERIZATION  06/06/1997   EF 65%  . CARDIOVASCULAR STRESS TEST  12/07/2007   EF 58%  . CARDIOVERSION N/A 01/08/2017   Procedure: CARDIOVERSION;  Surgeon: Wendall Stade, MD;  Location: [facility] ENDOSCOPY;  Service: Cardiovascular;  Laterality: N/A;  . CORONARY STENT PLACEMENT    . ELBOW ARTHROPLASTY    . EYE SURGERY    . KNEE ARTHROPLASTY    . NASAL SINUS SURGERY    . SHOULDER ACROMIOPLASTY    . SPINE SURGERY      Current  Medications: Outpatient Medications Prior to Visit  Medication Sig Dispense Refill  . Canagliflozin (INVOKANA) 300 MG TABS Take by mouth. 1 tab daily    . diltiazem (CARDIZEM CD) 180 MG 24 hr capsule TAKE 1 CAPSULE BY MOUTH  DAILY 90 capsule 2  . ELIQUIS 5 MG TABS tablet TAKE 1 TABLET BY MOUTH  TWICE A DAY 180 tablet 1  . fluticasone (FLONASE) 50 MCG/ACT nasal spray Place 2 sprays into both nostrils daily. 16 g 11  . loratadine (CLARITIN) 10 MG tablet Take 1 tablet (10 mg total) by mouth daily. 30 tablet 11  . metFORMIN (GLUCOPHAGE) 500 MG tablet Take 500 mg by mouth 2 (two) times daily.      . methocarbamol (ROBAXIN) 500 MG tablet Take 1,000 mg by mouth 3 (three) times daily. Take as needed (unsure of dosage)    . mirtazapine (REMERON) 45 MG tablet Take 1 tablet by mouth at bedtime. Take 1 tab daily at bedtime    . morphine (KADIAN) 50 MG 24 hr capsule Take 1 capsule by mouth every 12 (twelve) hours. Take 1 tab every 12 hours  0  . Multiple Vitamin (MULTIVITAMIN) capsule Take 1 capsule by mouth daily.      Marland Kitchen  nitroGLYCERIN (NITROSTAT) 0.4 MG SL tablet Place 1 tablet (0.4 mg total) under the tongue every 5 (five) minutes as needed for chest pain. 25 tablet 11  . ONE TOUCH ULTRA TEST test strip 1 each by Other route as needed. Use 1 strip to check glucose daily 100 each 4  . oxyCODONE-acetaminophen (PERCOCET) 10-325 MG per tablet Take 1 tablet by mouth 3 (three) times daily.      . TRADJENTA 5 MG TABS tablet 5 mg daily.     Marland Kitchen atorvastatin (LIPITOR) 80 MG tablet Take 1 tablet (80 mg total) by mouth daily. 90 tablet 0  . metoprolol tartrate (LOPRESSOR) 50 MG tablet TAKE 1 TABLET BY MOUTH TWO  TIMES DAILY 60 tablet 9   No facility-administered medications prior to visit.      Allergies:   Ambien [zolpidem tartrate]; Amoxicillin; and Penicillins   Social History   Socioeconomic History  . Marital status: Married    Spouse name: Joyce Gross  . Number of children: 0  . Years of education: Not on file   . Highest education level: Not on file  Occupational History  . 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 Financial resource strain: Not on file  . Food insecurity:    Worry: Not on file    Inability: Not on file  . Transportation needs:    Medical: Not on file    Non-medical: Not on file  Tobacco Use  . Smoking status: Former Smoker    Packs/day: 4.00    Years: 20.00    Pack years: 80.00    Last attempt to quit: 06/09/1985    Years since quitting: 33.2  . Smokeless tobacco: Former Engineer, water and Sexual Activity  . Alcohol use: No  . Drug use: No  . Sexual activity: Yes    Partners: Female    Birth control/protection: None  Lifestyle  . Physical activity:    Days per week: Not on file    Minutes per session: Not on file  . Stress: Not on file  Relationships  . Social connections:    Talks on phone: Not on file    Gets together: Not on file    Attends religious service: Not on file    Active member of club or organization: Not on file    Attends meetings of clubs or organizations: Not on file    Relationship status: Not on file  Other Topics Concern  . Not on file  Social History Narrative   Lives with his wife and their cat.     Family History:  The patient's family history includes Diabetes in his mother; Hypertension in his brother, father, and mother; Stroke in his father.   Review of Systems:   Please see the history of present illness.    All other systems reviewed and are otherwise negative except as noted above.   Physical Exam:    VS:  BP 134/86   Pulse (!) 56   Ht 6' 1.5" (1.867 m)   Wt 243 lb 3.2 oz (110.3 kg)   BMI 31.65 kg/m    GENERAL:  Well appearing WM in NAD HEENT:  PERRL, EOMI, sclera are clear. Oropharynx is clear. NECK:  No jugular venous distention, carotid upstroke brisk and symmetric, no bruits, no thyromegaly or adenopathy LUNGS:  Clear to auscultation bilaterally CHEST:  Unremarkable  HEART:  RRR,  PMI not displaced or sustained,S1 and S2 within normal limits, no S3,  no S4: no clicks, no rubs, no murmurs ABD:  Soft, nontender. BS +, no masses or bruits. No hepatomegaly, no splenomegaly EXT:  2 + pulses throughout, no edema, no cyanosis no clubbing SKIN:  Warm and dry.  No rashes NEURO:  Alert and oriented x 3. Cranial nerves II through XII intact. PSYCH:  Cognitively intact    Wt Readings from Last 3 Encounters:  08/26/18 243 lb 3.2 oz (110.3 kg)  02/24/18 240 lb 3.2 oz (109 kg)  09/16/17 236 lb (107 kg)     Studies/Labs Reviewed:   Recent Labs: No results found for requested labs within last 8760 hours.   Lipid Panel    Component Value Date/Time   CHOL 136 11/18/2016 1030   TRIG 217 (H) 11/18/2016 1030   HDL 39 (L) 11/18/2016 1030   CHOLHDL 3.5 11/18/2016 1030   LDLCALC 54 11/18/2016 1030   Dated 03/31/17: A1c 7.8%. Hgb and chemistries normal. Dated 09/29/17: glucose 117, otherwise CBC, CMET and PSA normal. Cholesterol 140, triglycerides 172, HDL 37, LDL 69. Dated 04/14/18: A1c 6.4%. cholesterol 140, Triglycerides 172, HDL 37, LDL 69. Glucose 131, creatinine 1.1. other chemistries, TSH, CBC normal   Additional studies/ records that were reviewed today include:   Echocardiogram: 11/24/2016 Study Conclusions  - Left ventricle: The cavity size was mildly dilated. There was   mild focal basal hypertrophy of the septum. Systolic function was   normal. The estimated ejection fraction was 55%. Wall motion was   normal; there were no regional wall motion abnormalities. - Aortic valve: Trileaflet; mildly thickened, mildly calcified   leaflets. - Mitral valve: Calcified annulus. - Left atrium: The atrium was moderately dilated. - Right ventricle: The cavity size was mildly dilated. Wall   thickness was normal. - Pulmonic valve: There was trivial regurgitation.    Assessment:    1. Coronary artery disease involving native coronary artery of native  heart without angina pectoris   2. Paroxysmal atrial fibrillation (HCC)   3. OSA (obstructive sleep apnea)   4. Essential hypertension   5. Hyperlipidemia LDL goal <70      Plan:    1. Paroxysmal Atrial Fibrillation/ Use of Long-term Anticoagulation - initially diagnosed with atrial fibrillation back in 11/2016 and is  s/p DCCV on 01/08/2017. - Maintaining NSR - will continue Lopressor and Cardizem CD at current doses. Refills given Encourage focus on weight loss and regular aerobic exercise.  -  Continue Eliquis  BID for anticoagulation.   2. CAD - s/p remote stenting of RCA in 1998. - he is asymptomatic.  - continue BB and statin therapy.   3. HTN - BP is  well controlled.  4. HLD - at goal on lipitor.  - continue Atorvastatin  daily.   5. OSA - continue CPAP.   Encourage increased aerobic activity. Will follow up in 6-9 months.   Signed, Karsyn Rochin [place]il[place], MD,FACC 08/26/2018 8:40 AM    [facility] [address], [address] [place], [place] [address] Phone: [phone]

## 2018-08-25 ENCOUNTER — Telehealth: Payer: Self-pay

## 2018-08-25 NOTE — Telephone Encounter (Signed)
lmtcb

## 2018-08-26 ENCOUNTER — Encounter: Payer: Self-pay | Admitting: Cardiology

## 2018-08-26 ENCOUNTER — Ambulatory Visit (INDEPENDENT_AMBULATORY_CARE_PROVIDER_SITE_OTHER): Payer: Medicare Other | Admitting: Cardiology

## 2018-08-26 VITALS — BP 134/86 | HR 56 | Ht 73.5 in | Wt 243.2 lb

## 2018-08-26 DIAGNOSIS — I2510 Atherosclerotic heart disease of native coronary artery without angina pectoris: Secondary | ICD-10-CM | POA: Diagnosis not present

## 2018-08-26 DIAGNOSIS — G4733 Obstructive sleep apnea (adult) (pediatric): Secondary | ICD-10-CM

## 2018-08-26 DIAGNOSIS — E785 Hyperlipidemia, unspecified: Secondary | ICD-10-CM

## 2018-08-26 DIAGNOSIS — I10 Essential (primary) hypertension: Secondary | ICD-10-CM | POA: Diagnosis not present

## 2018-08-26 DIAGNOSIS — I480 Paroxysmal atrial fibrillation: Secondary | ICD-10-CM

## 2018-08-26 MED ORDER — METOPROLOL TARTRATE 50 MG PO TABS: 50.0000 mg | ORAL_TABLET | Freq: Two times a day (BID) | ORAL | 3 refills | Status: DC

## 2018-08-26 MED ORDER — ATORVASTATIN CALCIUM 80 MG PO TABS: 80.0000 mg | ORAL_TABLET | Freq: Every day | ORAL | 3 refills | Status: DC

## 2018-10-05 ENCOUNTER — Other Ambulatory Visit: Payer: Self-pay | Admitting: Cardiology

## 2018-10-05 DIAGNOSIS — I480 Paroxysmal atrial fibrillation: Secondary | ICD-10-CM

## 2018-10-06 NOTE — Telephone Encounter (Signed)
Scr 0.9 in 09/2017, age 63, weight 110kg

## 2019-03-09 ENCOUNTER — Telehealth: Payer: Self-pay | Admitting: Cardiology

## 2019-03-09 NOTE — Telephone Encounter (Signed)
LVM for patient to call and let us know if he wants a phone or video visit for appt on 10-15.

## 2019-03-22 NOTE — Progress Notes (Signed)
Virtual Visit via Video Note   This visit type was conducted due to national recommendations for restrictions regarding the COVID-19 Pandemic (e.g. social distancing) in an effort to limit this patient's exposure and mitigate transmission in our community.  Due to his co-morbid illnesses, this patient is at least at moderate risk for complications without adequate follow up.  This format is felt to be most appropriate for this patient at this time.  All issues noted in this document were discussed and addressed.  A limited physical exam was performed with this format.  Please refer to the patient's chart for his consent to telehealth for [facility].   Date:  03/24/2019   ID:  Sanda LingerGlenn W Hartman, DOB 24-Jul-1955, MRN [id number]  Patient Location: Home Provider Location: Home  PCP:  [name], Adelfa Kohichard W, MD  Cardiologist:  Michael Cortopassi SwazilandJordan MD Electrophysiologist:  None   Evaluation Performed:  Follow-Up Visit  Chief Complaint:  Follow up Afib/CAD  History of Present Illness:    Michael Hartman is a 63 y.o. male with with past medical history of CAD (s/p stenting of RCA in 1998), PAF (on Eliquis), HTN, HLD, Type 2 DM, and OSA (on CPAP)who is seen  for follow-up of atrial fibrillation and CAD.   He was diagnosed with Afib in June of 2018. Despite good rate control he remained symptomatic with fatigue.  He underwent DCCV x2 with 150J and 200J with conversion to NSR in August 2018.   He states he is doing well. He is fairly sedentary due to the pandemic. Does some yard work and mows the grass. Has a home gym and planning to use it more. He notes his wife is going to need a mitral valve replacement. He denies any chest pain or SOB. He has lost 15 lbs.  The patient does not have symptoms concerning for COVID-19 infection (fever, chills, cough, or new shortness of breath).    Past Medical History:  Diagnosis Date  . Allergy   . Arthritis   . Atrial flutter (HCC)    a. diagnosed in 11/2016 -->  started on Eliquis for anticoagulation b. s/p DCCV in 01/2017  . Celiac disease   . Chronic rhinitis   . Coronary artery disease    a. s/p remote stenting of RCA in 1998  . Diabetes mellitus without complication (HCC)   . GERD (gastroesophageal reflux disease)   . History of migraine headaches   . Hypercholesteremia   . Hypertension   . OSA (obstructive sleep apnea)    Past Surgical History:  Procedure Laterality Date  . ANKLE ARTHROPLASTY    . CARDIAC CATHETERIZATION  06/06/1997   EF 65%  . CARDIOVASCULAR STRESS TEST  12/07/2007   EF 58%  . CARDIOVERSION N/A 01/08/2017   Procedure: CARDIOVERSION;  Surgeon: Wendall StadeNishan, Allean Montfort C, MD;  Location[facility] ENDOSCOPY;  Service: Cardiovascular;  Laterality: N/A;  . CORONARY STENT PLACEMENT    . ELBOW ARTHROPLASTY    . EYE SURGERY    . KNEE ARTHROPLASTY    . NASAL SINUS SURGERY    . SHOULDER ACROMIOPLASTY    . SPINE SURGERY       Current Meds  Medication Sig  . atorvastatin (LIPITOR) 80 MG tablet Take 1 tablet (80 mg total) by mouth daily.  . Canagliflozin (INVOKANA) 300 MG TABS Take by mouth. 1 tab daily  . diltiazem (CARDIZEM CD) 180 MG 24 hr capsule TAKE 1 CAPSULE BY MOUTH  DAILY  . ELIQUIS 5 MG TABS tablet TAKE  1 TABLET BY MOUTH  TWICE A DAY  . fluticasone (FLONASE) 50 MCG/ACT nasal spray Place 2 sprays into both nostrils daily.  Marland Kitchen loratadine (CLARITIN) 10 MG tablet Take 1 tablet (10 mg total) by mouth daily.  . metFORMIN (GLUCOPHAGE) 500 MG tablet Take 500 mg by mouth 2 (two) times daily.    . methocarbamol (ROBAXIN) 500 MG tablet Take 1,000 mg by mouth 3 (three) times daily. Take as needed (unsure of dosage)  . metoprolol tartrate (LOPRESSOR) 50 MG tablet Take 1 tablet (50 mg total) by mouth 2 (two) times daily.  . mirtazapine (REMERON) 45 MG tablet Take 1 tablet by mouth at bedtime. Take 1 tab daily at bedtime  . morphine (KADIAN) 50 MG 24 hr capsule Take 1 capsule by mouth every 12 (twelve) hours. Take 1 tab every 12 hours  . Multiple  Vitamin (MULTIVITAMIN) capsule Take 1 capsule by mouth daily.    . nitroGLYCERIN (NITROSTAT) 0.4 MG SL tablet Place 1 tablet (0.4 mg total) under the tongue every 5 (five) minutes as needed for chest pain.  . ONE TOUCH ULTRA TEST test strip 1 each by Other route as needed. Use 1 strip to check glucose daily  . oxyCODONE-acetaminophen (PERCOCET) 10-325 MG per tablet Take 1 tablet by mouth 3 (three) times daily.    . TRADJENTA 5 MG TABS tablet 5 mg daily.   . [DISCONTINUED] nitroGLYCERIN (NITROSTAT) 0.4 MG SL tablet Place 1 tablet (0.4 mg total) under the tongue every 5 (five) minutes as needed for chest pain.     Allergies:   Ambien [zolpidem tartrate], Amoxicillin, and Penicillins   Social History   Tobacco Use  . Smoking status: Former Smoker    Packs/day: 4.00    Years: 20.00    Pack years: 80.00    Quit date: 06/09/1985    Years since quitting: 33.8  . Smokeless tobacco: Former Engineer, water Use Topics  . Alcohol use: No  . Drug use: No     Family Hx: The patient's family history includes Diabetes in his mother; Hypertension in his brother, father, and mother; Stroke in his father.  ROS:   Please see the history of present illness.    All other systems reviewed and are negative.   Prior CV studies:   The following studies were reviewed today:  Echocardiogram: 11/24/2016 Study Conclusions  - Left ventricle: The cavity size was mildly dilated. There was mild focal basal hypertrophy of the septum. Systolic function was normal. The estimated ejection fraction was 55%. Wall motion was normal; there were no regional wall motion abnormalities. - Aortic valve: Trileaflet; mildly thickened, mildly calcified leaflets. - Mitral valve: Calcified annulus. - Left atrium: The atrium was moderately dilated. - Right ventricle: The cavity size was mildly dilated. Wall thickness was normal. - Pulmonic valve: There was trivial regurgitation.  Labs/Other Tests and Data  Reviewed:    EKG:  No ECG reviewed.  Recent Labs: No results found for requested labs within last 8760 hours.   Recent Lipid Panel Lab Results  Component Value Date/Time   CHOL 136 11/18/2016 10:30 AM   TRIG 217 (H) 11/18/2016 10:30 AM   HDL 39 (L) 11/18/2016 10:30 AM   CHOLHDL 3.5 11/18/2016 10:30 AM   LDLCALC 54 11/18/2016 10:30 AM   Labs dated 10/06/18: cholesterol 142, triglycerides 105, HDL 39, LDL 82. A1c 6.6%. CMET and CBC normal  Wt Readings from Last 3 Encounters:  03/24/19 227 lb (103 kg)  08/26/18 243 lb 3.2 oz (  110.3 kg)  02/24/18 240 lb 3.2 oz (109 kg)     Objective:    Vital Signs:  BP (!) 155/84   Pulse 84   Ht 6\' 2"  (1.88 m)   Wt 227 lb (103 kg)   BMI 29.15 kg/m    VITAL SIGNS:  reviewed  ASSESSMENT & PLAN:    1. Paroxysmal Atrial Fibrillation/ Use of Long-term Anticoagulation - initially diagnosed with atrial fibrillation back in 11/2016 and is  s/p DCCV on 01/08/2017. - Maintaining NSR - will continue Lopressor and Cardizem CD at current doses.  -  Continue Eliquis 5mg  BID for anticoagulation.   2. CAD - s/p remote stenting of RCA in 1998. - he is asymptomatic.  - continue BB and statin therapy.   3. HTN - BP is  well controlled.  4. HLD - doing well on lipitor.  - continue Atorvastatin 80mg  daily.   5. OSA - continue CPAP.   6. DM - reports last A1c 6.1%.    COVID-19 Education: The signs and symptoms of COVID-19 were discussed with the patient and how to seek care for testing (follow up with PCP or arrange E-visit).  The importance of social distancing was discussed today.  Time:   Today, I have spent 10  minutes with the patient with telehealth technology discussing the above problems.     Medication Adjustments/Labs and Tests Ordered: Current medicines are reviewed at length with the patient today.  Concerns regarding medicines are outlined above.   Tests Ordered: No orders of the defined types were placed in this  encounter.   Medication Changes: Meds ordered this encounter  Medications  . nitroGLYCERIN (NITROSTAT) 0.4 MG SL tablet    Sig: Place 1 tablet (0.4 mg total) under the tongue every 5 (five) minutes as needed for chest pain.    Dispense:  25 tablet    Refill:  11    Follow Up:  Either In Person or Virtual Visit in 6 month(s)  Signed, Tiant Peixoto Martin[place], MD  03/24/2019 8:00 AM    [place]

## 2019-03-24 ENCOUNTER — Telehealth (INDEPENDENT_AMBULATORY_CARE_PROVIDER_SITE_OTHER): Payer: Medicare Other | Admitting: Cardiology

## 2019-03-24 ENCOUNTER — Other Ambulatory Visit: Payer: Self-pay | Admitting: Cardiology

## 2019-03-24 ENCOUNTER — Encounter: Payer: Self-pay | Admitting: Cardiology

## 2019-03-24 VITALS — BP 155/84 | HR 84 | Ht 74.0 in | Wt 227.0 lb

## 2019-03-24 DIAGNOSIS — I2510 Atherosclerotic heart disease of native coronary artery without angina pectoris: Secondary | ICD-10-CM

## 2019-03-24 DIAGNOSIS — E785 Hyperlipidemia, unspecified: Secondary | ICD-10-CM

## 2019-03-24 DIAGNOSIS — I480 Paroxysmal atrial fibrillation: Secondary | ICD-10-CM

## 2019-03-24 DIAGNOSIS — G4733 Obstructive sleep apnea (adult) (pediatric): Secondary | ICD-10-CM

## 2019-03-24 DIAGNOSIS — I10 Essential (primary) hypertension: Secondary | ICD-10-CM

## 2019-03-24 MED ORDER — NITROGLYCERIN 0.4 MG SL SUBL: 0.4000 mg | SUBLINGUAL_TABLET | SUBLINGUAL | 11 refills | Status: AC | PRN

## 2019-03-24 NOTE — Patient Instructions (Addendum)
Medication Instructions:  Continue same medications   Lab Work: None ordered   Testing/Procedures: None ordered  Follow-Up: At [facility], you and your health needs are our priority.  As part of our continuing mission to provide you with exceptional heart care, we have created designated Provider Care Teams.  These Care Teams include your primary Cardiologist (physician) and Advanced Practice Providers (APPs -  Physician Assistants and Nurse Practitioners) who all work together to provide you with the care you need, when you need it.  Schedule follow up appointment in 6 months.   Call in Jan to schedule April appointment.

## 2019-05-07 ENCOUNTER — Other Ambulatory Visit: Payer: Self-pay | Admitting: Cardiology

## 2019-05-07 DIAGNOSIS — I480 Paroxysmal atrial fibrillation: Secondary | ICD-10-CM

## 2019-06-17 ENCOUNTER — Telehealth: Payer: Self-pay | Admitting: Cardiology

## 2019-06-17 NOTE — Telephone Encounter (Signed)
Just continue to monitor for now. Thanks  Hanish Laraia [place]il[place] MD, [facility]

## 2019-06-17 NOTE — Telephone Encounter (Signed)
Spoke with pt and feels fine and rechecked B/P once home and reading was good Per pt is not sure the one taking B/P had proper technique Also pt did have back pain and this could raise the B/P Encouraged pt to continue to monitor and if notes consistently high numbers to call back Pt verbalizes understanding and agrees . Will forward to Dr Swaziland for review and recommendations /cy

## 2019-06-17 NOTE — Telephone Encounter (Signed)
  Patient had his blood pressure taken at the pain management clinic and it was 188/110 and they told him to contact Dr Swaziland and let him know. Patient states he has taken it since he has been home and it was 121/80.

## 2019-09-02 ENCOUNTER — Other Ambulatory Visit: Payer: Self-pay | Admitting: Cardiology

## 2019-09-02 DIAGNOSIS — I480 Paroxysmal atrial fibrillation: Secondary | ICD-10-CM

## 2019-09-03 NOTE — Progress Notes (Signed)
Cardiology Office Note    Date:  09/08/2019   ID:  Michael, Hartman [birth year], MRN [id number]  PCP:  Michael Pao, MD  Cardiologist: Dr. Martinique   Chief Complaint  Patient presents with  . Coronary Artery Disease  . Atrial Fibrillation    History of Present Illness:    Michael Hartman is a 64 y.o. male with past medical history of CAD (s/p stenting of RCA in 1998), PAF (on Eliquis), HTN, HLD, Type 2 DM, and OSA (on CPAP) who presents to the office today for follow-up of atrial fibrillation and CAD.   He was diagnosed with Afib in June of 2018. Despite good rate control he remained symptomatic with fatigue.  He underwent DCCV x2 with 150J and 200J with conversion to NSR in August 2018.   His wife did undergo MV replacement this past year with an extended recovery but is now doing well. He is scheduled to see Dr Michael Hartman next month.   He denies any chest pain, palpitations, dyspnea or dizziness.     Past Medical History:  Diagnosis Date  . Allergy   . Arthritis   . Atrial flutter ([place])    a. diagnosed in 11/2016 --> started on Eliquis for anticoagulation b. s/p DCCV in 01/2017  . Celiac disease   . Chronic rhinitis   . Coronary artery disease    a. s/p remote stenting of RCA in 1998  . Diabetes mellitus without complication ([place])   . GERD (gastroesophageal reflux disease)   . History of migraine headaches   . Hypercholesteremia   . Hypertension   . OSA (obstructive sleep apnea)     Past Surgical History:  Procedure Laterality Date  . ANKLE ARTHROPLASTY    . CARDIAC CATHETERIZATION  06/06/1997   EF 65%  . CARDIOVASCULAR STRESS TEST  12/07/2007   EF 58%  . CARDIOVERSION N/A 01/08/2017   Procedure: CARDIOVERSION;  Surgeon: Michael Hector, MD;  Location: [facility] ENDOSCOPY;  Service: Cardiovascular;  Laterality: N/A;  . CORONARY STENT PLACEMENT    . ELBOW ARTHROPLASTY    . EYE SURGERY    . KNEE ARTHROPLASTY    . NASAL SINUS SURGERY    . SHOULDER ACROMIOPLASTY     . SPINE SURGERY      Current Medications: Outpatient Medications Prior to Visit  Medication Sig Dispense Refill  . atorvastatin (LIPITOR) 80 MG tablet TAKE 1 TABLET BY MOUTH  DAILY 90 tablet 0  . Canagliflozin (INVOKANA) 300 MG TABS Take by mouth. 1 tab daily    . ELIQUIS 5 MG TABS tablet TAKE 1 TABLET BY MOUTH  TWICE DAILY 180 tablet 1  . loratadine (CLARITIN) 10 MG tablet Take 1 tablet (10 mg total) by mouth daily. 30 tablet 11  . metFORMIN (GLUCOPHAGE) 500 MG tablet Take 500 mg by mouth 2 (two) times daily.      . methocarbamol (ROBAXIN) 500 MG tablet Take 1,000 mg by mouth 3 (three) times daily. Take as needed (unsure of dosage)    . metoprolol tartrate (LOPRESSOR) 50 MG tablet Take 1 tablet (50 mg total) by mouth 2 (two) times daily. 180 tablet 3  . mirtazapine (REMERON) 45 MG tablet Take 1 tablet by mouth at bedtime. Take 1 tab daily at bedtime    . morphine (KADIAN) 50 MG 24 hr capsule Take 1 capsule by mouth every 12 (twelve) hours. Take 1 tab every 12 hours  0  . Multiple Vitamin (MULTIVITAMIN) capsule Take 1 capsule by  mouth daily.      . nitroGLYCERIN (NITROSTAT) 0.4 MG SL tablet Place 1 tablet (0.4 mg total) under the tongue every 5 (five) minutes as needed for chest pain. 25 tablet 11  . ONE TOUCH ULTRA TEST test strip 1 each by Other route as needed. Use 1 strip to check glucose daily 100 each 4  . oxyCODONE-acetaminophen (PERCOCET) 10-325 MG per tablet Take 1 tablet by mouth 3 (three) times daily.      . TRADJENTA 5 MG TABS tablet 5 mg daily.     Marland Kitchen diltiazem (CARDIZEM CD) 180 MG 24 hr capsule TAKE 1 CAPSULE BY MOUTH  DAILY 90 capsule 3  . fluticasone (FLONASE) 50 MCG/ACT nasal spray Place 2 sprays into both nostrils daily. 16 g 11   No facility-administered medications prior to visit.     Allergies:   Ambien [zolpidem tartrate], Amoxicillin, and Penicillins   Social History   Socioeconomic History  . Marital status: Married    Spouse name: Michael Hartman  . Number of children:  0  . Years of education: Not on file  . Highest education level: Not on file  Occupational History  . Occupation: DETENTION OFFICER    Employer: [employer]  Tobacco Use  . Smoking status: Former Smoker    Packs/day: 4.00    Years: 20.00    Pack years: 80.00    Quit date: 06/09/1985    Years since quitting: 34.2  . Smokeless tobacco: Former Engineer, water and Sexual Activity  . Alcohol use: No  . Drug use: No  . Sexual activity: Yes    Partners: Female    Birth control/protection: None  Other Topics Concern  . Not on file  Social History Narrative   Lives with his wife and their cat.   Social Determinants of Health   Financial Resource Strain:   . Difficulty of Paying Living Expenses:   Food Insecurity:   . Worried About Programme researcher, broadcasting/film/video in the Last Year:   . Barista in the Last Year:   Transportation Needs:   . Freight forwarder (Medical):   Marland Kitchen Lack of Transportation (Non-Medical):   Physical Activity:   . Days of Exercise per Week:   . Minutes of Exercise per Session:   Stress:   . Feeling of Stress :   Social Connections:   . Frequency of Communication with Friends and Family:   . Frequency of Social Gatherings with Friends and Family:   . Attends Religious Services:   . Active Member of Clubs or Organizations:   . Attends Banker Meetings:   Marland Kitchen Marital Status:      Family History:  The patient's family history includes Diabetes in his mother; Hypertension in his brother, father, and mother; Stroke in his father.   Review of Systems:   Please see the history of present illness.    All other systems reviewed and are otherwise negative except as noted above.   Physical Exam:    VS:  BP (!) 142/80   Pulse (!) 51   Ht 6\' 2"  (1.88 m)   Wt 220 lb (99.8 kg)   BMI 28.25 kg/m    GENERAL:  Well appearing WM in NAD HEENT:  PERRL, EOMI, sclera are clear. Oropharynx is clear. NECK:  No jugular venous distention, carotid  upstroke brisk and symmetric, no bruits, no thyromegaly or adenopathy LUNGS:  Clear to auscultation bilaterally CHEST:  Unremarkable HEART:  RRR,  PMI  not displaced or sustained,S1 and S2 within normal limits, no S3, no S4: no clicks, no rubs, no murmurs ABD:  Soft, nontender. BS +, no masses or bruits. No hepatomegaly, no splenomegaly EXT:  2 + pulses throughout, no edema, no cyanosis no clubbing SKIN:  Warm and dry.  No rashes NEURO:  Alert and oriented x 3. Cranial nerves II through XII intact. PSYCH:  Cognitively intact    Wt Readings from Last 3 Encounters:  09/08/19 220 lb (99.8 kg)  03/24/19 227 lb (103 kg)  08/26/18 243 lb 3.2 oz (110.3 kg)     Studies/Labs Reviewed:   Recent Labs: No results found for requested labs within last 8760 hours.   Lipid Panel    Component Value Date/Time   CHOL 136 11/18/2016 1030   TRIG 217 (H) 11/18/2016 1030   HDL 39 (L) 11/18/2016 1030   CHOLHDL 3.5 11/18/2016 1030   LDLCALC 54 11/18/2016 1030   Dated 03/31/17: A1c 7.8%. Hgb and chemistries normal. Dated 09/29/17: glucose 117, otherwise CBC, CMET and PSA normal. Cholesterol 140, triglycerides 172, HDL 37, LDL 69. Dated 04/14/18: A1c 6.4%. cholesterol 140, Triglycerides 172, HDL 37, LDL 69. Glucose 131, creatinine 1.1. other chemistries, TSH, CBC normal Dated 10/06/18: cholesterol 142, triglycerides 105, HDL 39, LDL 82. A1c 6.6%. CMET and CBC normal.   Ecg today shows sinus brady rate 51 otherwise normal. I have personally reviewed and interpreted this study.   Additional studies/ records that were reviewed today include:   Echocardiogram: 11/24/2016 Study Conclusions  - Left ventricle: The cavity size was mildly dilated. There was   mild focal basal hypertrophy of the septum. Systolic function was   normal. The estimated ejection fraction was 55%. Wall motion was   normal; there were no regional wall motion abnormalities. - Aortic valve: Trileaflet; mildly thickened, mildly  calcified   leaflets. - Mitral valve: Calcified annulus. - Left atrium: The atrium was moderately dilated. - Right ventricle: The cavity size was mildly dilated. Wall   thickness was normal. - Pulmonic valve: There was trivial regurgitation.    Assessment:    1. Paroxysmal atrial fibrillation (HCC)   2. HYPERCHOLESTEROLEMIA   3. Essential hypertension   4. Coronary artery disease involving native coronary artery of native heart without angina pectoris      Plan:    1. Paroxysmal Atrial Fibrillation/ Use of Long-term Anticoagulation - initially diagnosed with atrial fibrillation back in 11/2016 and is  s/p DCCV on 01/08/2017. - Maintaining NSR - will continue Lopressor and Cardizem CD at current doses.  -  Continue Eliquis 5mg  BID for anticoagulation.   2. CAD - s/p remote stenting of RCA in 1998. - he is asymptomatic.  - continue BB and statin therapy.   3. HTN - BP is  well controlled.  4. HLD - at goal on lipitor.  - continue Atorvastatin 80mg  daily.   5. OSA - continue CPAP.   Will get lab work next month with primary care. Follow up in one year.    Signed, Ravenne Wayment 1999, MD,FACC 09/08/2019 10:11 AM    [facility] [address], [address] [place], [address] [place] Phone: [phone]

## 2019-09-05 ENCOUNTER — Other Ambulatory Visit: Payer: Self-pay | Admitting: Cardiology

## 2019-09-05 NOTE — Telephone Encounter (Signed)
64 M 110.3 kg, SCr 1 (09/2018), LOV 03/2019 [place]

## 2019-09-08 ENCOUNTER — Encounter: Payer: Self-pay | Admitting: Cardiology

## 2019-09-08 ENCOUNTER — Other Ambulatory Visit: Payer: Self-pay | Admitting: Cardiology

## 2019-09-08 ENCOUNTER — Ambulatory Visit (INDEPENDENT_AMBULATORY_CARE_PROVIDER_SITE_OTHER): Payer: Medicare Other | Admitting: Cardiology

## 2019-09-08 ENCOUNTER — Other Ambulatory Visit: Payer: Self-pay

## 2019-09-08 VITALS — BP 142/80 | HR 51 | Ht 74.0 in | Wt 220.0 lb

## 2019-09-08 DIAGNOSIS — I480 Paroxysmal atrial fibrillation: Secondary | ICD-10-CM

## 2019-09-08 DIAGNOSIS — I2510 Atherosclerotic heart disease of native coronary artery without angina pectoris: Secondary | ICD-10-CM | POA: Diagnosis not present

## 2019-09-08 DIAGNOSIS — I10 Essential (primary) hypertension: Secondary | ICD-10-CM | POA: Diagnosis not present

## 2019-09-08 DIAGNOSIS — E7800 Pure hypercholesterolemia, unspecified: Secondary | ICD-10-CM

## 2019-09-08 MED ORDER — DILTIAZEM HCL ER COATED BEADS 180 MG PO CP24: 180.0000 mg | ORAL_CAPSULE | Freq: Every day | ORAL | 3 refills | Status: DC

## 2019-09-10 IMAGING — DX DG CHEST 2V
2 series · 2 of 2 positions shown · non-contrast
Comparison: Two-view chest x-ray 07/19/14

CLINICAL DATA: Cough.  Chest pain.

EXAM:
CHEST - 2 VIEW

[chest pa]
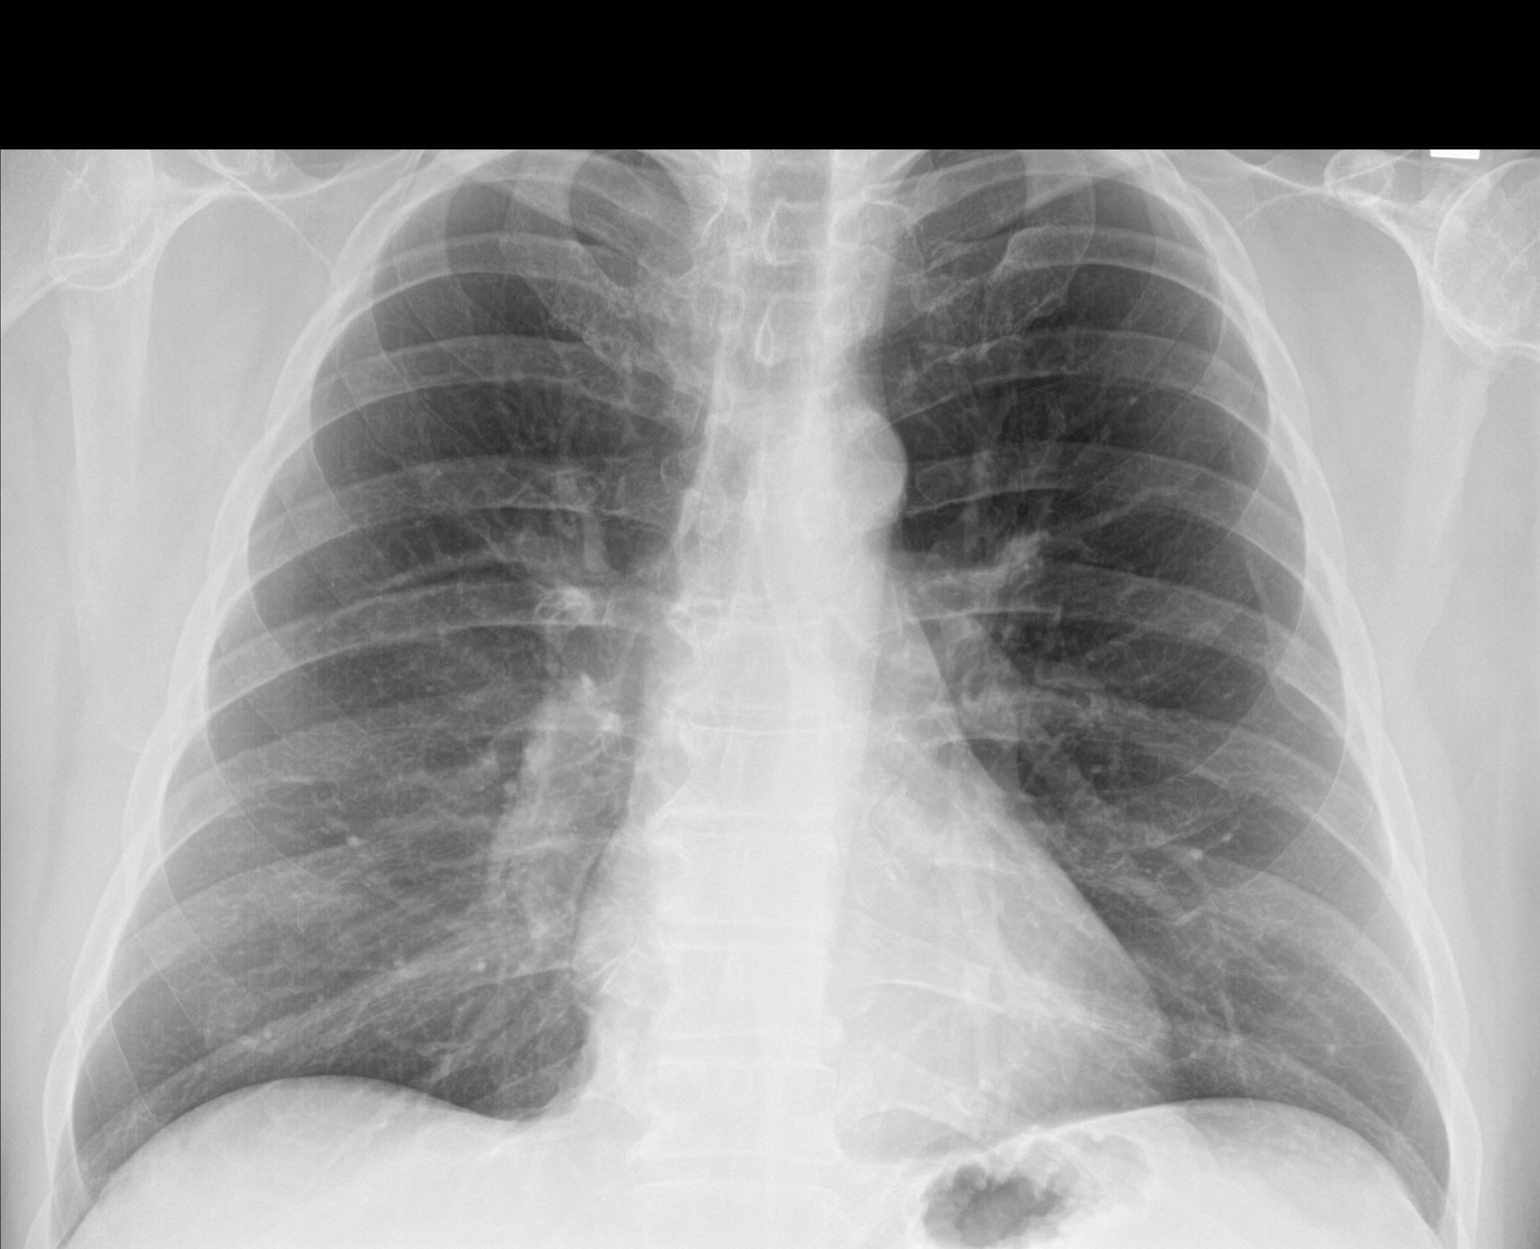

[chest lat]
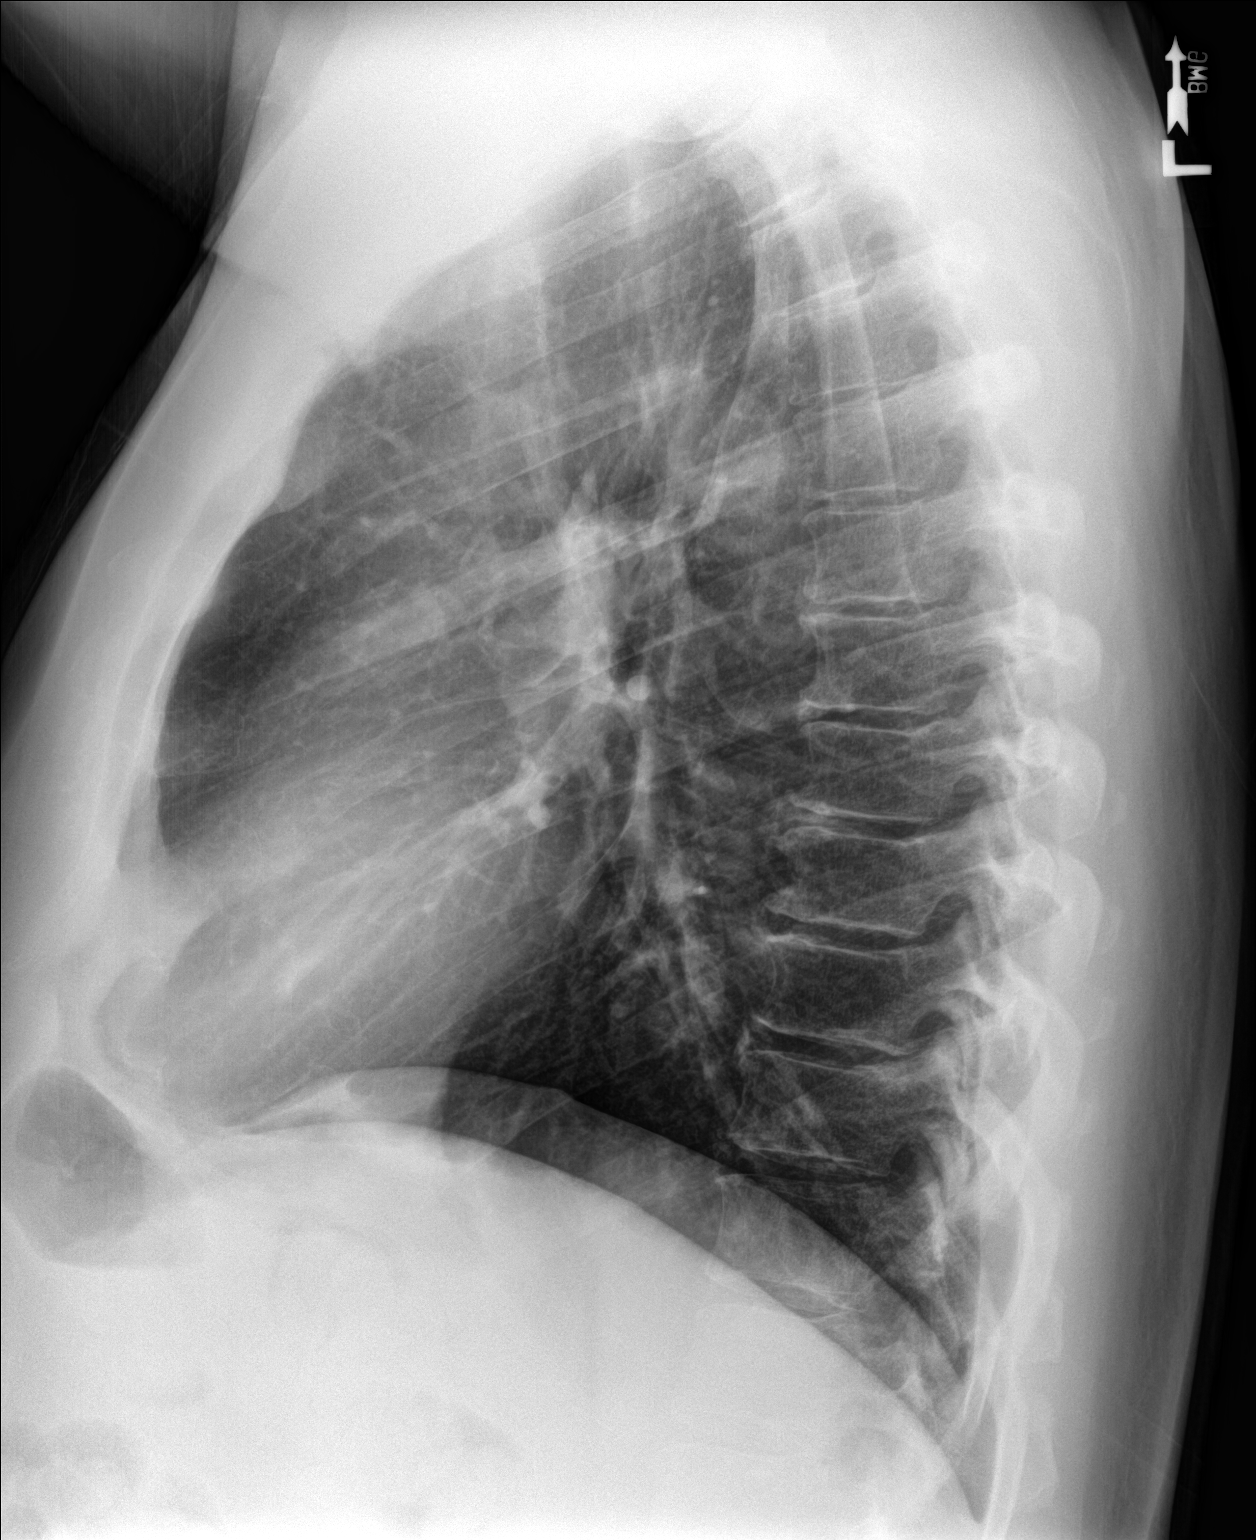

[2 of 2 positions shown; findings below may reference images not displayed]

FINDINGS: The heart size and mediastinal contours are within normal limits.
Both lungs are clear. The visualized skeletal structures are
unremarkable.
IMPRESSION: No active cardiopulmonary disease.

## 2019-10-21 ENCOUNTER — Telehealth: Payer: Self-pay

## 2020-02-15 ENCOUNTER — Other Ambulatory Visit: Payer: Self-pay | Admitting: Cardiology

## 2020-02-15 DIAGNOSIS — I480 Paroxysmal atrial fibrillation: Secondary | ICD-10-CM

## 2020-02-16 NOTE — Telephone Encounter (Signed)
Prescription refill request for Eliquis received. Indication: Atrial Flutter Last office visit: 09/08/2019 [place] Scr: 1.0 10/2019 Age: 64 Weight: 99.8 kg  Prescription refilled

## 2020-05-11 ENCOUNTER — Other Ambulatory Visit: Payer: Self-pay | Admitting: Cardiology

## 2020-05-11 DIAGNOSIS — I480 Paroxysmal atrial fibrillation: Secondary | ICD-10-CM

## 2020-07-16 ENCOUNTER — Telehealth: Payer: Self-pay | Admitting: General Practice

## 2020-07-16 ENCOUNTER — Ambulatory Visit (INDEPENDENT_AMBULATORY_CARE_PROVIDER_SITE_OTHER): Payer: Medicare Other | Admitting: Neurology

## 2020-07-16 ENCOUNTER — Telehealth: Payer: Self-pay | Admitting: Neurology

## 2020-07-16 ENCOUNTER — Encounter: Payer: Self-pay | Admitting: Neurology

## 2020-07-16 VITALS — BP 132/88 | HR 62 | Ht 74.0 in | Wt 219.3 lb

## 2020-07-16 DIAGNOSIS — I499 Cardiac arrhythmia, unspecified: Secondary | ICD-10-CM

## 2020-07-16 DIAGNOSIS — Z9989 Dependence on other enabling machines and devices: Secondary | ICD-10-CM

## 2020-07-16 DIAGNOSIS — Z79899 Other long term (current) drug therapy: Secondary | ICD-10-CM

## 2020-07-16 DIAGNOSIS — G4733 Obstructive sleep apnea (adult) (pediatric): Secondary | ICD-10-CM | POA: Diagnosis not present

## 2020-07-16 DIAGNOSIS — R413 Other amnesia: Secondary | ICD-10-CM | POA: Diagnosis not present

## 2020-07-16 NOTE — Telephone Encounter (Signed)
Patient reports he went to neurologist today and informed he was in a-fib. HR was 114. Patient has been taking Eliquis 5mg  BID, patient has 1 bottle left. Patient reports he can check his HR with his wife's pulse Ox if needed. Patient denies chest pain, shortness of breath, dizziness or lightheadedness. Patient is not aware of his heart beating irregularly. Patient has a follow up on 2/17. If symptoms occur or heart rate is no longer rate controlled patient will call back.

## 2020-07-16 NOTE — Telephone Encounter (Signed)
Spoke to patient's wife she stated she would like her husband seen before 2/17.Appointment rescheduled with Edd Fabian NP 07/19/20 at 3:15 pm.

## 2020-07-16 NOTE — Progress Notes (Signed)
Subjective:    Patient ID: Michael Hartman is a 65 y.o. male.  HPI     Huston Foley, MD, PhD [facility] [address] [address] [place], [place] [address]  Dear [name],  I saw your patient, Michael Hartman, upon your kind request in my neurologic clinic today for initial consultation of his memory loss.  The patient is accompanied by his wife today.  As you know, Michael Hartman is a 65 year old right-handed gentleman with an underlying medical history of coronary artery disease with status post stenting, paroxysmal A. fib with status post cardioversion in 2018, chronic rhinitis, reflux disease, diabetes, migraine headaches, sleep apnea, on CPAP therapy, hyperlipidemia, hypertension, allergies, arthritis, low back pain, on chronic narcotic pain medication, and overweight state, who reports very little about his symptoms, his referral was primarily requested by his wife.  She provides most of his history.  She reports concern about his short-term memory for the past 2 to 3 years.  He has a tendency to be forgetful, including forgetting conversations or events.  He is often repeating himself, including repeating questions that were recently discussed.  He has done most of the housework lately because she had open heart surgery.  He drives without difficulty and typically has had no difficulty driving.  He has been compliant with his CPAP and is followed by Dr. Earl Gala, has an appointment coming up soon.  He reports some forgetfulness.  His wife endorses that patient's mom had a diagnosis of Alzheimer's dementia, she lived to be into her 26s.  Patient is followed by Dr. Manon Hilding for pain management.  He has been on Percocet and morphine for at least 5 years.  He takes Percocet up to 3 times a day and takes morphine twice daily.  He also takes a muscle relaxer, 1 or 2 pills up to 3 times a day, Robaxin.  He quit smoking over 40 years ago.  He has no biological children but has  stepchildren.  He has been married to his wife for 34 years.  He drinks quite a bit of water, estimates that he drinks about 2 to 3 L/day, no alcohol on a regular basis and drinks caffeine in the form of diet Pepsi, about 2/day on average.  He is retired.  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  He does report having sustained head injuries in the past. I reviewed your office note from 04/25/2020.  Had a CMP at the time which showed glucose of 135, creatinine 1.2, BUN 15, A1c 6.6.  He also had additional labs including TSH, RPR and vitamin B12, we will request those test results from your office as well.  He denies any sudden onset of one-sided weakness or numbness or tingling or droopy face or slurring of speech.  Upon further asking if he has been in A. fib.  He reports that at his follow-up appointment with Dr. Manon Hilding he was told that he may be in A. fib.  He has not had any new symptoms such as shortness of breath, palpitations, chest pain.  His Past Medical History Is Significant For: Past Medical History:  Diagnosis Date  . Allergy   . Arthritis   . Atrial flutter (HCC)    a. diagnosed in 11/2016 --> started on Eliquis for anticoagulation b. s/p DCCV in 01/2017  . Celiac disease   . Chronic rhinitis   . Coronary artery disease  a. s/p remote stenting of RCA in 1998  . Diabetes mellitus without complication (HCC)   . GERD (gastroesophageal reflux disease)   . History of migraine headaches   . Hypercholesteremia   . Hypertension   . OSA (obstructive sleep apnea)     His Past Surgical History Is Significant For: Past Surgical History:  Procedure Laterality Date  . ANKLE ARTHROPLASTY    . CARDIAC CATHETERIZATION  06/06/1997   EF 65%  . CARDIOVASCULAR STRESS TEST  12/07/2007   EF 58%  . CARDIOVERSION N/A 01/08/2017   Procedure: CARDIOVERSION;  Surgeon: Wendall Stade, MD;  Location: [facility] ENDOSCOPY;  Service: Cardiovascular;  Laterality: N/A;  .  CORONARY STENT PLACEMENT    . ELBOW ARTHROPLASTY    . EYE SURGERY    . KNEE ARTHROPLASTY    . NASAL SINUS SURGERY    . SHOULDER ACROMIOPLASTY    . SPINE SURGERY      His Family History Is Significant For: Family History  Problem Relation Age of Onset  . Hypertension Mother   . Diabetes Mother   . Stroke Father   . Hypertension Father   . Hypertension Brother     His Social History Is Significant For: Social History   Socioeconomic History  . Marital status: Married    Spouse name: Joyce Gross  . Number of children: 0  . Years of education: Not on file  . Highest education level: Not on file  Occupational History  . Occupation: DETENTION OFFICER    Employer: [employer]  Tobacco Use  . Smoking status: Former Smoker    Packs/day: 4.00    Years: 20.00    Pack years: 80.00    Quit date: 06/09/1985    Years since quitting: 35.1  . Smokeless tobacco: Former Engineer, water and Sexual Activity  . Alcohol use: No  . Drug use: No  . Sexual activity: Yes    Partners: Female    Birth control/protection: None  Other Topics Concern  . Not on file  Social History Narrative   Lives with his wife and their cat.   Social Determinants of Health   Financial Resource Strain: Not on file  Food Insecurity: Not on file  Transportation Needs: Not on file  Physical Activity: Not on file  Stress: Not on file  Social Connections: Not on file    His Allergies Are:  Allergies  Allergen Reactions  . Ambien [Zolpidem Tartrate]     Amnesia, odd behavior  . Amoxicillin   . Penicillins     Child hood  :   His Current Medications Are:  Outpatient Encounter Medications as of 07/16/2020  Medication Sig  . atorvastatin (LIPITOR) 80 MG tablet TAKE 1 TABLET BY MOUTH  DAILY  . canagliflozin (INVOKANA) 300 MG TABS tablet Take by mouth. 1 tab daily  . CARTIA XT 180 MG 24 hr capsule TAKE 1 CAPSULE BY MOUTH  DAILY  . ELIQUIS 5 MG TABS tablet TAKE 1 TABLET BY MOUTH  TWICE DAILY  .  FARXIGA 10 MG TABS tablet TAKE 1 TABLET BY MOUTH DAILY *FOR FURTHER REFILLS, PLEASE CONTACT OFFICE TO MAKE FUTURE APT*  . loratadine (CLARITIN) 10 MG tablet Take 1 tablet (10 mg total) by mouth daily.  . metFORMIN (GLUCOPHAGE) 500 MG tablet Take 1,000 mg by mouth 2 (two) times daily.  . methocarbamol (ROBAXIN) 500 MG tablet Take 1,000 mg by mouth 3 (three) times daily. Take as needed (unsure of dosage)  . metoprolol tartrate (LOPRESSOR)  50 MG tablet TAKE 1 TABLET BY MOUTH  TWICE DAILY  . mirtazapine (REMERON) 45 MG tablet Take 1 tablet by mouth at bedtime. Take 1 tab daily at bedtime  . morphine (KADIAN) 50 MG 24 hr capsule Take 1 capsule by mouth every 12 (twelve) hours. Take 1 tab every 12 hours  . Multiple Vitamin (MULTIVITAMIN) capsule Take 1 capsule by mouth daily.  . nitroGLYCERIN (NITROSTAT) 0.4 MG SL tablet Place 1 tablet (0.4 mg total) under the tongue every 5 (five) minutes as needed for chest pain.  . ONE TOUCH ULTRA TEST test strip 1 each by Other route as needed. Use 1 strip to check glucose daily  . oxyCODONE-acetaminophen (PERCOCET) 10-325 MG per tablet Take 1 tablet by mouth 3 (three) times daily.  . TRADJENTA 5 MG TABS tablet 5 mg daily.    No facility-administered encounter medications on file as of 07/16/2020.  :   Review of Systems:  Out of a complete 14 point review of systems, all are reviewed and negative with the exception of these symptoms as listed below:    Review of Systems  Neurological:       Here for eval on memory loss. Pt reports he has not noticed a change in his memory but wife reports he has trouble recalling details she gives to him.    Objective:  Neurological Exam  Physical Exam Physical Examination:   Vitals:   07/16/20 1019  BP: 132/88  Pulse: 62  SpO2: 95%    General Examination: The patient is a very pleasant 65 y.o. male in no acute distress. He appears well-developed and well-nourished and well groomed.   HEENT: Normocephalic,  atraumatic, pupils are equal, round and reactive to light, extraocular tracking is well preserved, hearing appears to be mildly impaired, face is symmetric with normal facial animation, speech without dysarthria, hypophonia or voice tremor.  Neck is supple with good range of motion, no carotid bruits.  Airway examination reveals mild mouth dryness, moderate airway crowding, tongue protrudes centrally and palate elevates symmetrically.    Chest: Clear to auscultation without wheezing, rhonchi or crackles noted.  Heart: S1+S2+0, irregularly irregular, with no murmurs, rubs or gallops noted.   Abdomen: Soft, non-tender and non-distended with normal bowel sounds appreciated on auscultation.  Extremities: There is no pitting edema in the distal lower extremities bilaterally. Pedal pulses are intact.  Skin: Warm and dry without trophic changes noted.  Musculoskeletal: exam reveals no obvious joint deformities, tenderness or joint swelling or erythema.   Neurologically:  Mental status: The patient is awake, alert and oriented in all 4 spheres. His immediate and remote memory, attention, language skills and fund of knowledge are mildly impaired.  He is not providing details to his history and looks to his wife for responses often.  His wife reports most of the details of his history including family history of dementia affecting his mother.    On 07/16/2020: MMSE: 26/30 (He lost 3 points on orientation to time, 1 point on orientation to place), CDT: 4/4, AFT: 11/min.  There is no evidence of aphasia, agnosia, apraxia or anomia. Speech is scant without dysarthria or hypophonia.  Thought process is linear. Mood is normal and affect is normal.  Cranial nerves II - XII are as described above under HEENT exam. In addition: shoulder shrug is normal with equal shoulder height noted. Motor exam: Normal bulk, strength and tone is noted. There is no drift, tremor or rebound.  Romberg is not tested due to safety  concerns.  Fine motor skills are grossly intact in the upper and lower extremities.  Cerebellar testing: No dysmetria or intention tremor on finger to nose testing. Heel to shin is unremarkable bilaterally. There is no truncal or gait ataxia.  Sensory exam: intact to light touch in the upper and lower extremities.  Gait, station and balance: He stands with mild difficulty, posture shows mild increase in lumbar kyphosis.  He walks slightly slowly and cautiously, preserved arm swing noted, no shuffling.  Assessment and Plan:   In summary, Michael Hartman is a very pleasant 65 y.o.-year old male with an underlying medical history of coronary artery disease with status post stenting, paroxysmal A. fib with status post cardioversion in 2018, chronic rhinitis, reflux disease, diabetes, migraine headaches, sleep apnea, on CPAP therapy, hyperlipidemia, hypertension, allergies, arthritis, low back pain, on chronic narcotic pain medication, and overweight state, who presents for evaluation of his memory loss of about 2 to 3 years duration.  His memory scores are mildly abnormal.  He does have several vascular risk factors including coronary artery disease, history of A. fib, OSA, hypertension, hyperlipidemia and diabetes, very remote history of smoking.  He also reports a family history of dementia affecting his mother with a possible diagnosis of Alzheimer's dementia.  Of note, he also takes several medications including high risk medications including narcotic pain medication as well as a higher dose of a muscle relaxant which could affect his memory function as well.  Given this complicated history, more in-depth memory evaluation is indicated.  He is advised to proceed with a consultation with a neuropsychologist.  I explained this evaluation to the patient and his wife in detail today.  In addition, I would like to proceed with a brain MRI with and without contrast to rule out a structural cause of his  symptoms. His heart auscultation reveals an irregular heartbeat, he could be in atrial fibrillation.  He is advised to see if he can going to see his cardiologist sooner than scheduled.  If possible, he is advised to call your office to see if he can get an EKG done through your office if he cannot get into see his cardiologist in the next few days.  He certainly does not have any symptoms from this, he is on Eliquis, he denies any sudden onset of neurological symptoms or shortness of breath, chest pain or palpitations.  We talked about the importance of healthy lifestyle.  We we will reconvene after his evaluation and MRI are done.  We will request blood test results from your office as well.  I did not suggest any new medications from my end of things at this time.  We will call with the MRI results once they are available.  I answered all their questions today and the patient and his wife were in agreement.   Thank you very much for allowing me to participate in the care of this nice patient. If I can be of any further assistance to you please do not hesitate to call me at [phone].  Sincerely,   Huston Foley, MD, PhD

## 2020-07-16 NOTE — Patient Instructions (Addendum)
You have complaints of memory loss: memory loss or changes in cognitive function can have many reasons and does not always mean you have dementia. Conditions that can contribute to subjective or objective memory loss include: depression, stress, poor sleep from insomnia or sleep apnea, dehydration, fluctuation in blood sugar values, thyroid or electrolyte dysfunction and certain vitamin deficiencies. Dementia can be caused by stroke, brain atherosclerosis or brain vascular disease due to vascular risk factors (smoking, high blood pressure, high cholesterol, obesity and uncontrolled diabetes), certain degenerative brain disorders (including Parkinson's disease and Multiple sclerosis) and by Alzheimer's disease or other, more rare and sometimes hereditary causes. We will do some additional testing: blood work (which has been done recently already) and we will do a brain scan. We will not start medication as yet. We will also request a formal cognitive test called neuropsychological evaluation which is done by a licensed neuropsychologist. We will make a referral in that regard. We will call you with brain scan test results and monitor your symptoms. Your memory loss is rather mild at this point, which, of course is reassuring.   You have an irregular heartbeat.  You could be in A. fib.  I would recommend you call your cardiologist office for sooner than scheduled appointment.  You do have a scheduled appointment with Dr. Swaziland in about 2 months.  If you cannot get a sooner appointment in the next few days, please call Dr. Deneen Harts office to see if you can get in for an EKG. At your checkup with Dr. Swaziland in April 2021, you were not felt to be in A. Fib.  We will make a follow-up appointment in about 3 months, we will keep you posted as to your MRI results by phone call in the interim.  Please keep your upcoming appointment with Dr. Earl Gala for sleep apnea recheck.  Of note, you do take high risk  medications including chronic morphine and oxycodone which could potentially affect your memory function.

## 2020-07-16 NOTE — Telephone Encounter (Signed)
[employer] medicare order sent to GI. No auth they will reach out to the patient to schedule.  

## 2020-07-16 NOTE — Telephone Encounter (Signed)
Patient c/o Palpitations:  High priority if patient c/o lightheadedness, shortness of breath, or chest pain  1) How long have you had palpitations/irregular HR/ Afib? Are you having the symptoms now? Was made aware of being in Afib at neurologist appointment today, was not aware.   2) Are you currently experiencing lightheadedness, SOB or CP? No   3) Do you have a history of afib (atrial fibrillation) or irregular heart rhythm? Yes   4) Have you checked your BP or HR? (document readings if available): 115 (right before noon)  5) Are you experiencing any other symptoms? Fatigue    Michael Hartman is calling stating Michael Hartman was notified at his appointment today he was in Afib. He reports no symptoms besides fatigue. An appointment has been scheduled for 07/26/20 with Edd Fabian due to this. Please advise.

## 2020-07-16 NOTE — Telephone Encounter (Signed)
Ok keep appt on the 17th  Sharlett Lienemann Sw[place]iland MD, [facility]

## 2020-07-17 ENCOUNTER — Ambulatory Visit: Payer: Medicare Other | Admitting: Physician Assistant

## 2020-07-17 NOTE — Progress Notes (Signed)
Cardiology Clinic Note   Patient Name: Michael Hartman Date of Encounter: 07/19/2020  Primary Care Provider:  Gaspar Garbe, MD Primary Cardiologist:  Peter Swaziland, MD  Patient Profile    Michael Hartman 65 year old male presents the clinic today for an evaluation of his atrial fibrillation.  Past Medical History    Past Medical History:  Diagnosis Date  . Allergy   . Arthritis   . Atrial flutter (HCC)    a. diagnosed in 11/2016 --> started on Eliquis for anticoagulation b. s/p DCCV in 01/2017  . Celiac disease   . Chronic rhinitis   . Coronary artery disease    a. s/p remote stenting of RCA in 1998  . Diabetes mellitus without complication (HCC)   . GERD (gastroesophageal reflux disease)   . History of migraine headaches   . Hypercholesteremia   . Hypertension   . OSA (obstructive sleep apnea)    Past Surgical History:  Procedure Laterality Date  . ANKLE ARTHROPLASTY    . CARDIAC CATHETERIZATION  06/06/1997   EF 65%  . CARDIOVASCULAR STRESS TEST  12/07/2007   EF 58%  . CARDIOVERSION N/A 01/08/2017   Procedure: CARDIOVERSION;  Surgeon: Wendall Stade, MD;  Location: [facility] ENDOSCOPY;  Service: Cardiovascular;  Laterality: N/A;  . CORONARY STENT PLACEMENT    . ELBOW ARTHROPLASTY    . EYE SURGERY    . KNEE ARTHROPLASTY    . NASAL SINUS SURGERY    . SHOULDER ACROMIOPLASTY    . SPINE SURGERY      Allergies  Allergies  Allergen Reactions  . Ambien [Zolpidem Tartrate]     Amnesia, odd behavior  . Amoxicillin   . Penicillins     Child hood    History of Present Illness    [name] has a PMH of paroxysmal atrial fibrillation on Eliquis, hypercholesterolemia, hypertension, and coronary artery disease.  He is status post stenting of RCA in 1998.  His PMH also includes type 2 diabetes OSA on CPAP, and HLD.  He was diagnosed with atrial fibrillation 6/18.  Even though he had good rate control he remained symptomatic with fatigue.  He underwent DCCV x2 150 J /  200 J with conversion to sinus rhythm 8/18.  He was last seen by Dr. Swaziland on 09/07/2020.  His echocardiogram showed sinus bradycardia 51 bpm.  He denied chest pain, palpitations, dyspnea and dizziness.  He contacted the nurse triage line on 07/16/2020.  He reported that he was at a neurology appointment and was identified to be in atrial fibrillation.  He was asymptomatic except for fatigue and he noted his pulse was elevated at 115 bpm.  He presents the clinic today for follow-up evaluation states he has unusual feelings of flutter occasionally.  He denies increased shortness of breath, increased activity intolerance and states that he is generally able to do all of the things that he likes to do.  He does not report increased fatigue.  He reports that he has not missed any doses of his anticoagulation.  His biggest complaint today is that he was not able to get his MRI due to a panic attack.  We reviewed his previous echocardiogram and options for his atrial flutter.  I will refer him to the A. fib clinic, repeat his echocardiogram and have him follow-up with Dr. Swaziland as scheduled.  Today he denies chest pain, shortness of breath, lower extremity edema, fatigue, palpitations, melena, hematuria, hemoptysis, diaphoresis, weakness, presyncope, syncope, orthopnea, and PND.  Home Medications    Prior to Admission medications   Medication Sig Start Date End Date Taking? Authorizing Provider  atorvastatin (LIPITOR) 80 MG tablet TAKE 1 TABLET BY MOUTH  DAILY 09/06/19   Sw[place]iland, Peter M, MD  canagliflozin [facility]) 300 MG TABS tablet Take by mouth. 1 tab daily    [provider]  CARTIA XT 180 MG 24 hr capsule TAKE 1 CAPSULE BY MOUTH  DAILY 05/14/20   Sw[place]iland, Peter M, MD  ELIQUIS 5 MG TABS tablet TAKE 1 TABLET BY MOUTH  TWICE DAILY 02/16/20   Swaziland, Peter M, MD  FARXIGA 10 MG TABS tablet TAKE 1 TABLET BY MOUTH DAILY *FOR FURTHER REFILLS, PLEASE CONTACT OFFICE TO MAKE FUTURE APT* 06/23/20    [provider]  loratadine (CLARITIN) 10 MG tablet Take 1 tablet (10 mg total) by mouth daily. 01/23/17   Ethelda Chick, MD  metFORMIN (GLUCOPHAGE) 500 MG tablet Take 1,000 mg by mouth 2 (two) times daily.    [provider]  methocarbamol (ROBAXIN) 500 MG tablet Take 1,000 mg by mouth 3 (three) times daily. Take as needed (unsure of dosage)    [provider]  metoprolol tartrate (LOPRESSOR) 50 MG tablet TAKE 1 TABLET BY MOUTH  TWICE DAILY 09/09/19   Swaziland, Peter M, MD  mirtazapine (REMERON) 45 MG tablet Take 1 tablet by mouth at bedtime. Take 1 tab daily at bedtime 04/03/15   [provider]  morphine (KADIAN) 50 MG 24 hr capsule Take 1 capsule by mouth every 12 (twelve) hours. Take 1 tab every 12 hours 04/05/15   [provider]  Multiple Vitamin (MULTIVITAMIN) capsule Take 1 capsule by mouth daily.    [provider]  nitroGLYCERIN (NITROSTAT) 0.4 MG SL tablet Place 1 tablet (0.4 mg total) under the tongue every 5 (five) minutes as needed for chest pain. 03/24/19   Swaziland, Peter M, MD  ONE TOUCH ULTRA TEST test strip 1 each by Other route as needed. Use 1 strip to check glucose daily 01/23/17   Ethelda Chick, MD  oxyCODONE-acetaminophen (PERCOCET) 10-325 MG per tablet Take 1 tablet by mouth 3 (three) times daily.    [provider]  TRADJENTA 5 MG TABS tablet 5 mg daily.  03/03/12   [provider]    Family History    Family History  Problem Relation Age of Onset  . Hypertension Mother   . Diabetes Mother   . Stroke Father   . Hypertension Father   . Hypertension Brother    He indicated that his mother is deceased. He indicated that his father is deceased. He indicated that his brother is alive. He indicated that his maternal grandmother is deceased. He indicated that his maternal grandfather is deceased. He indicated that his paternal grandmother is deceased. He indicated that his paternal grandfather is  deceased.  Social History    Social History   Socioeconomic History  . Marital status: Married    Spouse name: Joyce Gross  . Number of children: 0  . Years of education: Not on file  . Highest education level: Not on file  Occupational History  . Occupation: DETENTION OFFICER    Employer: [employer]  Tobacco Use  . Smoking status: Former Smoker    Packs/day: 4.00    Years: 20.00    Pack years: 80.00    Quit date: 06/09/1985    Years since quitting: 35.1  . Smokeless tobacco: Former Engineer, water and Sexual Activity  .  Alcohol use: No  . Drug use: No  . Sexual activity: Yes    Partners: Female    Birth control/protection: None  Other Topics Concern  . Not on file  Social History Narrative   Lives with his wife and their cat.   Social Determinants of Health   Financial Resource Strain: Not on file  Food Insecurity: Not on file  Transportation Needs: Not on file  Physical Activity: Not on file  Stress: Not on file  Social Connections: Not on file  Intimate Partner Violence: Not on file     Review of Systems    General:  No chills, fever, night sweats or weight changes.  Cardiovascular:  No chest pain, dyspnea on exertion, edema, orthopnea, palpitations, paroxysmal nocturnal dyspnea. Dermatological: No rash, lesions/masses Respiratory: No cough, dyspnea Urologic: No hematuria, dysuria Abdominal:   No nausea, vomiting, diarrhea, bright red blood per rectum, melena, or hematemesis Neurologic:  No visual changes, wkns, changes in mental status. All other systems reviewed and are otherwise negative except as noted above.  Physical Exam    VS:  BP 130/80 (BP Location: Left Arm, Patient Position: Sitting)   Pulse 73   Ht 6\' 2"  (1.88 m)   Wt 225 lb 6.4 oz (102.2 kg)   SpO2 93%   BMI 28.94 kg/m  , BMI Body mass index is 28.94 kg/m. GEN: Well nourished, well developed, in no acute distress. HEENT: normal. Neck: Supple, no JVD, carotid bruits, or  masses. Cardiac: Atrial flutter, no murmurs, rubs, or gallops. No clubbing, cyanosis, edema.  Radials/DP/PT 2+ and equal bilaterally.  Respiratory:  Respirations regular and unlabored, clear to auscultation bilaterally. GI: Soft, nontender, nondistended, BS + x 4. MS: no deformity or atrophy. Skin: warm and dry, no rash. Neuro:  Strength and sensation are intact. Psych: Normal affect.  Accessory Clinical Findings    Recent Labs: No results found for requested labs within last 8760 hours.   Recent Lipid Panel    Component Value Date/Time   CHOL 136 11/18/2016 1030   TRIG 217 (H) 11/18/2016 1030   HDL 39 (L) 11/18/2016 1030   CHOLHDL 3.5 11/18/2016 1030   LDLCALC 54 11/18/2016 1030    ECG personally reviewed by me today-atrial flutter with 4:1 AV conduction T wave abnormality consider anterior ischemia 73 bpm- No acute changes  EKG 09/08/2019 Sinus bradycardia 51 bpm  Echocardiogram 11/24/2016 Study Conclusions   - Left ventricle: The cavity size was mildly dilated. There was  mild focal basal hypertrophy of the septum. Systolic function was  normal. The estimated ejection fraction was 55%. Wall motion was  normal; there were no regional wall motion abnormalities.  - Aortic valve: Trileaflet; mildly thickened, mildly calcified  leaflets.  - Mitral valve: Calcified annulus.  - Left atrium: The atrium was moderately dilated.  - Right ventricle: The cavity size was mildly dilated. Wall  thickness was normal.  - Pulmonic valve: There was trivial regurgitation.   Assessment & Plan   1.  Paroxysmal atrial fibrillation-EKG today shows atrial flutter with 41 AV conduction T wave abnormality consider inferior ischemia 73 bpm.  Cardiac unaware.  Does note some increased fatigue.  Reports compliance with Eliquis and denies bleeding issues.  Case discussed with DOD. Continue metoprolol, Eliquis Heart healthy low-sodium diet-salty 6 given Increase physical activity as  tolerated Avoid triggers caffeine, chocolate, EtOH, dehydration etc. Order echocardiogram Referred to atrial fibrillation clinic   Essential hypertension-BP today 130/80.  Well-controlled at home Continue metoprolol, Heart healthy low-sodium  diet-salty 6 given Increase physical activity as tolerated  Hypercholesterolemia-LDL 54 on 11/18/2016 Continue atorvastatin, Heart healthy low-sodium high-fiber diet Increase physical activity as tolerated  Coronary artery disease-no chest pain today.  Cardiac catheterization 1998 status post RCA stenting.  Continues to remain physically active. Continue metoprolol, Eliquis, atorvastatin, nitroglycerin Heart healthy low-sodium diet-salty 6 given Increase physical activity as tolerated  Disposition: Follow-up with Dr. Swaziland as scheduled.  Thomasene Ripple. Madi Bonfiglio NP-C    07/19/2020, 4:46 PM [facility] [address] Office [phone] Fax [phone]  Notice: This dictation was prepared with Dragon dictation along with smaller phrase technology. Any transcriptional errors that result from this process are unintentional and may not be corrected upon review.  I spent 30 minutes examining this patient, reviewing medications, and using patient centered shared decision making involving her cardiac care.  Prior to her visit I spent greater than 20 minutes reviewing her past medical history,  medications, and prior cardiac tests.

## 2020-07-19 ENCOUNTER — Ambulatory Visit (INDEPENDENT_AMBULATORY_CARE_PROVIDER_SITE_OTHER): Payer: Medicare Other | Admitting: General Practice

## 2020-07-19 ENCOUNTER — Encounter: Payer: Self-pay | Admitting: General Practice

## 2020-07-19 ENCOUNTER — Other Ambulatory Visit: Payer: Self-pay

## 2020-07-19 ENCOUNTER — Ambulatory Visit
Admission: RE | Admit: 2020-07-19 | Discharge: 2020-07-19 | Disposition: A | Discharge status: Home / Self Care | Payer: Medicare Other | Source: Ambulatory Visit | Attending: Neurology | Admitting: Neurology

## 2020-07-19 VITALS — BP 130/80 | HR 73 | Ht 74.0 in | Wt 225.4 lb

## 2020-07-19 DIAGNOSIS — I480 Paroxysmal atrial fibrillation: Secondary | ICD-10-CM

## 2020-07-19 DIAGNOSIS — I2510 Atherosclerotic heart disease of native coronary artery without angina pectoris: Secondary | ICD-10-CM | POA: Diagnosis not present

## 2020-07-19 DIAGNOSIS — I499 Cardiac arrhythmia, unspecified: Secondary | ICD-10-CM

## 2020-07-19 DIAGNOSIS — R413 Other amnesia: Secondary | ICD-10-CM

## 2020-07-19 DIAGNOSIS — I10 Essential (primary) hypertension: Secondary | ICD-10-CM

## 2020-07-19 DIAGNOSIS — G4733 Obstructive sleep apnea (adult) (pediatric): Secondary | ICD-10-CM

## 2020-07-19 DIAGNOSIS — E7800 Pure hypercholesterolemia, unspecified: Secondary | ICD-10-CM

## 2020-07-19 DIAGNOSIS — Z79899 Other long term (current) drug therapy: Secondary | ICD-10-CM

## 2020-07-19 DIAGNOSIS — Z9989 Dependence on other enabling machines and devices: Secondary | ICD-10-CM

## 2020-07-19 MED ORDER — GADOBENATE DIMEGLUMINE 529 MG/ML IV SOLN: 20.0000 mL | Freq: Once | INTRAVENOUS | Status: DC | PRN

## 2020-07-19 NOTE — Patient Instructions (Addendum)
Medication Instructions:  The current medical regimen is effective;  continue present plan and medications as directed. Please refer to the Current Medication list given to you today.  *If you need a refill on your cardiac medications before your next appointment, please call your pharmacy*  Lab Work:   Testing/Procedures:  NONE    NONE  Special Instructions REFER TO AFIB CLINIC  Echocardiogram - Your physician has requested that you have an echocardiogram. Echocardiography is a painless test that uses sound waves to create images of your heart. It provides your doctor with information about the size and shape of your heart and how well your heart's chambers and valves are working. This procedure takes approximately one hour. There are no restrictions for this procedure. This will be performed at our [facility] location - [address].  PLEASE READ AND FOLLOW SALTY 6-ATTACHED-1,800mg  daily  PLEASE INCREASE PHYSICAL ACTIVITY AS TOLERATED  Please try to avoid these triggers:  Do not use any products that have nicotine or tobacco in them. These include cigarettes, e-cigarettes, and chewing tobacco. If you need help quitting, ask your doctor.  Eat heart-healthy foods. Talk with your doctor about the right eating plan for you.  Exercise regularly as told by your doctor.  Stay hydrated  Do not drink alcohol, Caffeine or chocolate.  Lose weight if you are overweight.  Do not use drugs, including cannabis   Follow-Up: Your next appointment:  KEEP SCHEDULED APPOINTMENT 09-11-2020 @ 120PM  In Person with Peter Swaziland, MD   At [facility], you and your health needs are our priority.  As part of our continuing mission to provide you with exceptional heart care, we have created designated Provider Care Teams.  These Care Teams include your primary Cardiologist (physician) and Advanced Practice Providers (APPs -  Physician Assistants and Nurse Practitioners) who all work together  to provide you with the care you need, when you need it.            6 SALTY THINGS TO AVOID     1,800MG  DAILY

## 2020-07-24 ENCOUNTER — Telehealth: Payer: Self-pay

## 2020-07-24 NOTE — Telephone Encounter (Signed)
Thank you for the notification.  Maybe we can try again at a later point.

## 2020-07-24 NOTE — Telephone Encounter (Signed)
Patient's wife called today on the patient.  She reports the patient went last week for his MRI, and it did not go well.  Patient was unable to complete study, he had anxiety that resulted in crying and shaking during the study.  Patient is adamant he will not pursue another MRI.  I did discuss with him we could prescribe a medication like Xanax to see if it would help calm his nerves, but he again stated he would not pursue a MRI. Pt is scheduled for Neuropsych eval on 07/26/20 and will keep this appt as scheduled.

## 2020-07-24 NOTE — Telephone Encounter (Signed)
Lvm advising pt we could look at doing MRI at a later point if he wishes.

## 2020-07-25 NOTE — Progress Notes (Signed)
Primary Care Physician: Gaspar Garbe, MD Primary Cardiologist: Dr Swaziland Primary Electrophysiologist: none Referring Physician: Edd Fabian   Michael Hartman is a 65 y.o. male with a history of OSA, HTN, HLD, DM, CAD, Celiac disease, and atrial flutter who presents for consultation in the [facility].  The patient was initially diagnosed with atrial flutter 11/2016 after presenting with symptoms of increased fatigue. He underwent DCCV on 01/08/17. Patient is on Eliquis for a CHADS2VASC score of 4. Patient was in his usual state of health until 07/16/20 when he was noted to be back out of rhythm at his neurology office visit. In hindsight, he has noted increased fatigue recently. There were no specific triggers that he could identify.   Today, he denies symptoms of palpitations, chest pain, shortness of breath, orthopnea, PND, lower extremity edema, dizziness, presyncope, syncope, snoring, daytime somnolence, bleeding, or neurologic sequela. The patient is tolerating medications without difficulties and is otherwise without complaint today.    Atrial Fibrillation Risk Factors:  he does have symptoms or diagnosis of sleep apnea. he is compliant with CPAP therapy. he does not have a history of rheumatic fever.   he has a BMI of Body mass index is 28.19 kg/m.Marland Kitchen Filed Weights   07/26/20 0949  Weight: 99.6 kg    Family History  Problem Relation Age of Onset  . Hypertension Mother   . Diabetes Mother   . Stroke Father   . Hypertension Father   . Hypertension Brother      Atrial Fibrillation Management history:  Previous antiarrhythmic drugs: none Previous cardioversions: 01/2017 Previous ablations: none CHADS2VASC score: 4 Anticoagulation history: Eliquis   Past Medical History:  Diagnosis Date  . Allergy   . Arthritis   . Atrial flutter (HCC)    a. diagnosed in 11/2016 --> started on Eliquis for anticoagulation b. s/p DCCV in 01/2017  .  Celiac disease   . Chronic rhinitis   . Coronary artery disease    a. s/p remote stenting of RCA in 1998  . Diabetes mellitus without complication (HCC)   . GERD (gastroesophageal reflux disease)   . History of migraine headaches   . Hypercholesteremia   . Hypertension   . OSA (obstructive sleep apnea)    Past Surgical History:  Procedure Laterality Date  . ANKLE ARTHROPLASTY    . CARDIAC CATHETERIZATION  06/06/1997   EF 65%  . CARDIOVASCULAR STRESS TEST  12/07/2007   EF 58%  . CARDIOVERSION N/A 01/08/2017   Procedure: CARDIOVERSION;  Surgeon: Wendall Stade, MD;  Location: [facility] ENDOSCOPY;  Service: Cardiovascular;  Laterality: N/A;  . CORONARY STENT PLACEMENT    . ELBOW ARTHROPLASTY    . EYE SURGERY    . KNEE ARTHROPLASTY    . NASAL SINUS SURGERY    . SHOULDER ACROMIOPLASTY    . SPINE SURGERY      Current Outpatient Medications  Medication Sig Dispense Refill  . acetaminophen (TYLENOL) 500 MG tablet Take 500 mg by mouth every 6 (six) hours as needed for mild pain or headache.    Marland Kitchen atorvastatin (LIPITOR) 80 MG tablet TAKE 1 TABLET BY MOUTH  DAILY 90 tablet 0  . CARTIA XT 180 MG 24 hr capsule TAKE 1 CAPSULE BY MOUTH  DAILY 90 capsule 3  . docusate sodium (COLACE) 100 MG capsule Take 100 mg by mouth daily.    Marland Kitchen ELIQUIS 5 MG TABS tablet TAKE 1 TABLET BY MOUTH  TWICE DAILY 180 tablet 1  .  FARXIGA 10 MG TABS tablet TAKE 1 TABLET BY MOUTH DAILY *FOR FURTHER REFILLS, PLEASE CONTACT OFFICE TO MAKE FUTURE APT*    . fluticasone (FLONASE) 50 MCG/ACT nasal spray Place into both nostrils daily.    . metFORMIN (GLUCOPHAGE) 500 MG tablet Take 1,000 mg by mouth 2 (two) times daily.    . methocarbamol (ROBAXIN) 500 MG tablet Take 1,000 mg by mouth 3 (three) times daily. Take as needed (unsure of dosage)    . metoprolol tartrate (LOPRESSOR) 50 MG tablet TAKE 1 TABLET BY MOUTH  TWICE DAILY 180 tablet 3  . morphine (KADIAN) 50 MG 24 hr capsule Take 1 capsule by mouth every 12 (twelve) hours. Take 1  tab every 12 hours  0  . Multiple Vitamin (MULTIVITAMIN) capsule Take 1 capsule by mouth daily.    . nitroGLYCERIN (NITROSTAT) 0.4 MG SL tablet Place 1 tablet (0.4 mg total) under the tongue every 5 (five) minutes as needed for chest pain. 25 tablet 11  . ONE TOUCH ULTRA TEST test strip 1 each by Other route as needed. Use 1 strip to check glucose daily 100 each 4  . oxyCODONE-acetaminophen (PERCOCET) 10-325 MG per tablet Take 1 tablet by mouth 3 (three) times daily.    . TRADJENTA 5 MG TABS tablet 5 mg daily.      No current facility-administered medications for this encounter.    Allergies  Allergen Reactions  . Ambien [Zolpidem Tartrate]     Amnesia, odd behavior  . Amoxicillin   . Penicillins     Child hood    Social History   Socioeconomic History  . Marital status: Married    Spouse name: Kay  . Number of children: 0  . Years of education: Not on file  . Highest education level: Not on file  Occupational History  . Occupation: DETENTION OFFICER    Employer: [employer]  Tobacco Use  . Smoking status: Former Smoker    Packs/day: 4.00    Years: 20.00    Pack years: 80.00    Quit date: 06/09/1985    Years since quitting: 35.1  . Smokeless tobacco: Former User  Substance and Sexual Activity  . Alcohol use: No  . Drug use: No  . Sexual activity: Yes    Partners: Female    Birth control/protection: None  Other Topics Concern  . Not on file  Social History Narrative   Lives with his wife and their cat.   Social Determinants of Health   Financial Resource Strain: Not on file  Food Insecurity: Not on file  Transportation Needs: Not on file  Physical Activity: Not on file  Stress: Not on file  Social Connections: Not on file  Intimate Partner Violence: Not on file     ROS- All systems are reviewed and negative except as per the HPI above.  Physical Exam: Vitals:   07/26/20 0949  BP: 136/90  Pulse: 81  Weight: 99.6 kg  Height: 6' 2" (1.88 m)     GEN- The patient is well appearing, alert and oriented x 3 today.   Head- normocephalic, atraumatic Eyes-  Sclera clear, conjunctiva pink Ears- hearing intact Oropharynx- clear Neck- supple  Lungs- Clear to ausculation bilaterally, normal work of breathing Heart- irregular rate and rhythm, no murmurs, rubs or gallops  GI- soft, NT, ND, + BS Extremities- no clubbing, cyanosis, or edema MS- no significant deformity or atrophy Skin- no rash or lesion Psych- euthymic mood, full affect Neuro- strength and sensation are   intact  Wt Readings from Last 3 Encounters:  07/26/20 99.6 kg  07/19/20 102.2 kg  07/16/20 99.5 kg    EKG today demonstrates  Typical atrial flutter with variable block Vent. rate 81 BPM QRS duration 84 ms QT/QTc 338/392 ms  Echo 11/24/16 demonstrated  Left ventricle: The cavity size was mildly dilated. There was  mild focal basal hypertrophy of the septum. Systolic function was  normal. The estimated ejection fraction was 55%. Wall motion was  normal; there were no regional wall motion abnormalities.  - Aortic valve: Trileaflet; mildly thickened, mildly calcified  leaflets.  - Mitral valve: Calcified annulus.  - Left atrium: The atrium was moderately dilated.  - Right ventricle: The cavity size was mildly dilated. Wall  thickness was normal.  - Pulmonic valve: There was trivial regurgitation.   Epic records are reviewed at length today  CHA2DS2-VASc Score = 4  The patient's score is based upon: CHF History: No HTN History: Yes Diabetes History: Yes Stroke History: No Vascular Disease History: Yes Age Score: 1 Gender Score: 0      ASSESSMENT AND PLAN: 1. Atrial flutter The patient's CHA2DS2-VASc score is 4, indicating a 4.8% annual risk of stroke.   Appears typical. No afib seen on previous ECGs. We discussed therapeutic options today including DCCV and ablation. Patient would like to pursue DCCV in the short term and then discuss  possible ablation with Dr Elberta Fortis (his wife is an established patient) Check bmet/cbc Continue diltiazem 180 mg daily Continue Eliquis 5 mg BID Continue Lopressor 50 mg BID  2. Secondary Hypercoagulable State (ICD10:  D68.69) The patient is at significant risk for stroke/thromboembolism based upon his CHA2DS2-VASc Score of 4.  Continue Apixaban (Eliquis).   3. Obstructive sleep apnea The importance of adequate treatment of sleep apnea was discussed today in order to improve our ability to maintain sinus rhythm long term. Patient reports compliance with CPAP therapy.   4. HTN Stable, no changes today.  5. CAD No anginal symptoms.    Follow up with Dr Elberta Fortis post DCCV. Follow up with Dr Swaziland as scheduled.    Jorja Loa PA-C [facility] [address] [place], [place] [address] [phone] 07/26/2020 9:59 AM

## 2020-07-25 NOTE — H&P (View-Only) (Signed)
Primary Care Physician: Gaspar Garbe, MD Primary Cardiologist: Dr Swaziland Primary Electrophysiologist: none Referring Physician: Edd Fabian   Michael Hartman is a 65 y.o. male with a history of OSA, HTN, HLD, DM, CAD, Celiac disease, and atrial flutter who presents for consultation in the [facility].  The patient was initially diagnosed with atrial flutter 11/2016 after presenting with symptoms of increased fatigue. He underwent DCCV on 01/08/17. Patient is on Eliquis for a CHADS2VASC score of 4. Patient was in his usual state of health until 07/16/20 when he was noted to be back out of rhythm at his neurology office visit. In hindsight, he has noted increased fatigue recently. There were no specific triggers that he could identify.   Today, he denies symptoms of palpitations, chest pain, shortness of breath, orthopnea, PND, lower extremity edema, dizziness, presyncope, syncope, snoring, daytime somnolence, bleeding, or neurologic sequela. The patient is tolerating medications without difficulties and is otherwise without complaint today.    Atrial Fibrillation Risk Factors:  he does have symptoms or diagnosis of sleep apnea. he is compliant with CPAP therapy. he does not have a history of rheumatic fever.   he has a BMI of Body mass index is 28.19 kg/m.Marland Kitchen Filed Weights   07/26/20 0949  Weight: 99.6 kg    Family History  Problem Relation Age of Onset  . Hypertension Mother   . Diabetes Mother   . Stroke Father   . Hypertension Father   . Hypertension Brother      Atrial Fibrillation Management history:  Previous antiarrhythmic drugs: none Previous cardioversions: 01/2017 Previous ablations: none CHADS2VASC score: 4 Anticoagulation history: Eliquis   Past Medical History:  Diagnosis Date  . Allergy   . Arthritis   . Atrial flutter (HCC)    a. diagnosed in 11/2016 --> started on Eliquis for anticoagulation b. s/p DCCV in 01/2017  .  Celiac disease   . Chronic rhinitis   . Coronary artery disease    a. s/p remote stenting of RCA in 1998  . Diabetes mellitus without complication (HCC)   . GERD (gastroesophageal reflux disease)   . History of migraine headaches   . Hypercholesteremia   . Hypertension   . OSA (obstructive sleep apnea)    Past Surgical History:  Procedure Laterality Date  . ANKLE ARTHROPLASTY    . CARDIAC CATHETERIZATION  06/06/1997   EF 65%  . CARDIOVASCULAR STRESS TEST  12/07/2007   EF 58%  . CARDIOVERSION N/A 01/08/2017   Procedure: CARDIOVERSION;  Surgeon: Wendall Stade, MD;  Location: [facility] ENDOSCOPY;  Service: Cardiovascular;  Laterality: N/A;  . CORONARY STENT PLACEMENT    . ELBOW ARTHROPLASTY    . EYE SURGERY    . KNEE ARTHROPLASTY    . NASAL SINUS SURGERY    . SHOULDER ACROMIOPLASTY    . SPINE SURGERY      Current Outpatient Medications  Medication Sig Dispense Refill  . acetaminophen (TYLENOL) 500 MG tablet Take 500 mg by mouth every 6 (six) hours as needed for mild pain or headache.    Marland Kitchen atorvastatin (LIPITOR) 80 MG tablet TAKE 1 TABLET BY MOUTH  DAILY 90 tablet 0  . CARTIA XT 180 MG 24 hr capsule TAKE 1 CAPSULE BY MOUTH  DAILY 90 capsule 3  . docusate sodium (COLACE) 100 MG capsule Take 100 mg by mouth daily.    Marland Kitchen ELIQUIS 5 MG TABS tablet TAKE 1 TABLET BY MOUTH  TWICE DAILY 180 tablet 1  .  FARXIGA 10 MG TABS tablet TAKE 1 TABLET BY MOUTH DAILY *FOR FURTHER REFILLS, PLEASE CONTACT OFFICE TO MAKE FUTURE APT*    . fluticasone (FLONASE) 50 MCG/ACT nasal spray Place into both nostrils daily.    . metFORMIN (GLUCOPHAGE) 500 MG tablet Take 1,000 mg by mouth 2 (two) times daily.    . methocarbamol (ROBAXIN) 500 MG tablet Take 1,000 mg by mouth 3 (three) times daily. Take as needed (unsure of dosage)    . metoprolol tartrate (LOPRESSOR) 50 MG tablet TAKE 1 TABLET BY MOUTH  TWICE DAILY 180 tablet 3  . morphine (KADIAN) 50 MG 24 hr capsule Take 1 capsule by mouth every 12 (twelve) hours. Take 1  tab every 12 hours  0  . Multiple Vitamin (MULTIVITAMIN) capsule Take 1 capsule by mouth daily.    . nitroGLYCERIN (NITROSTAT) 0.4 MG SL tablet Place 1 tablet (0.4 mg total) under the tongue every 5 (five) minutes as needed for chest pain. 25 tablet 11  . ONE TOUCH ULTRA TEST test strip 1 each by Other route as needed. Use 1 strip to check glucose daily 100 each 4  . oxyCODONE-acetaminophen (PERCOCET) 10-325 MG per tablet Take 1 tablet by mouth 3 (three) times daily.    . TRADJENTA 5 MG TABS tablet 5 mg daily.      No current facility-administered medications for this encounter.    Allergies  Allergen Reactions  . Ambien [Zolpidem Tartrate]     Amnesia, odd behavior  . Amoxicillin   . Penicillins     Child hood    Social History   Socioeconomic History  . Marital status: Married    Spouse name: Joyce Gross  . Number of children: 0  . Years of education: Not on file  . Highest education level: Not on file  Occupational History  . Occupation: DETENTION OFFICER    Employer: [employer]  Tobacco Use  . Smoking status: Former Smoker    Packs/day: 4.00    Years: 20.00    Pack years: 80.00    Quit date: 06/09/1985    Years since quitting: 35.1  . Smokeless tobacco: Former Engineer, water and Sexual Activity  . Alcohol use: No  . Drug use: No  . Sexual activity: Yes    Partners: Female    Birth control/protection: None  Other Topics Concern  . Not on file  Social History Narrative   Lives with his wife and their cat.   Social Determinants of Health   Financial Resource Strain: Not on file  Food Insecurity: Not on file  Transportation Needs: Not on file  Physical Activity: Not on file  Stress: Not on file  Social Connections: Not on file  Intimate Partner Violence: Not on file     ROS- All systems are reviewed and negative except as per the HPI above.  Physical Exam: Vitals:   07/26/20 0949  BP: 136/90  Pulse: 81  Weight: 99.6 kg  Height: 6\' 2"  (1.88 m)     GEN- The patient is well appearing, alert and oriented x 3 today.   Head- normocephalic, atraumatic Eyes-  Sclera clear, conjunctiva pink Ears- hearing intact Oropharynx- clear Neck- supple  Lungs- Clear to ausculation bilaterally, normal work of breathing Heart- irregular rate and rhythm, no murmurs, rubs or gallops  GI- soft, NT, ND, + BS Extremities- no clubbing, cyanosis, or edema MS- no significant deformity or atrophy Skin- no rash or lesion Psych- euthymic mood, full affect Neuro- strength and sensation are  intact  Wt Readings from Last 3 Encounters:  07/26/20 99.6 kg  07/19/20 102.2 kg  07/16/20 99.5 kg    EKG today demonstrates  Typical atrial flutter with variable block Vent. rate 81 BPM QRS duration 84 ms QT/QTc 338/392 ms  Echo 11/24/16 demonstrated  Left ventricle: The cavity size was mildly dilated. There was  mild focal basal hypertrophy of the septum. Systolic function was  normal. The estimated ejection fraction was 55%. Wall motion was  normal; there were no regional wall motion abnormalities.  - Aortic valve: Trileaflet; mildly thickened, mildly calcified  leaflets.  - Mitral valve: Calcified annulus.  - Left atrium: The atrium was moderately dilated.  - Right ventricle: The cavity size was mildly dilated. Wall  thickness was normal.  - Pulmonic valve: There was trivial regurgitation.   Epic records are reviewed at length today  CHA2DS2-VASc Score = 4  The patient's score is based upon: CHF History: No HTN History: Yes Diabetes History: Yes Stroke History: No Vascular Disease History: Yes Age Score: 1 Gender Score: 0      ASSESSMENT AND PLAN: 1. Atrial flutter The patient's CHA2DS2-VASc score is 4, indicating a 4.8% annual risk of stroke.   Appears typical. No afib seen on previous ECGs. We discussed therapeutic options today including DCCV and ablation. Patient would like to pursue DCCV in the short term and then discuss  possible ablation with Dr Elberta Fortis (his wife is an established patient) Check bmet/cbc Continue diltiazem 180 mg daily Continue Eliquis 5 mg BID Continue Lopressor 50 mg BID  2. Secondary Hypercoagulable State (ICD10:  D68.69) The patient is at significant risk for stroke/thromboembolism based upon his CHA2DS2-VASc Score of 4.  Continue Apixaban (Eliquis).   3. Obstructive sleep apnea The importance of adequate treatment of sleep apnea was discussed today in order to improve our ability to maintain sinus rhythm long term. Patient reports compliance with CPAP therapy.   4. HTN Stable, no changes today.  5. CAD No anginal symptoms.    Follow up with Dr Elberta Fortis post DCCV. Follow up with Dr Swaziland as scheduled.    Jorja Loa PA-C [facility] [address] [place], [place] [address] [phone] 07/26/2020 9:59 AM

## 2020-07-26 ENCOUNTER — Ambulatory Visit: Payer: Medicare Other | Admitting: General Practice

## 2020-07-26 ENCOUNTER — Other Ambulatory Visit: Payer: Self-pay

## 2020-07-26 ENCOUNTER — Encounter (HOSPITAL_COMMUNITY): Payer: Self-pay | Admitting: Physician Assistant

## 2020-07-26 ENCOUNTER — Encounter: Payer: Medicare Other | Admitting: Psychology

## 2020-07-26 ENCOUNTER — Ambulatory Visit (HOSPITAL_COMMUNITY)
Admission: RE | Admit: 2020-07-26 | Discharge: 2020-07-26 | Disposition: A | Discharge status: Home / Self Care | Payer: Medicare Other | Source: Ambulatory Visit | Attending: Physician Assistant | Admitting: Physician Assistant

## 2020-07-26 VITALS — BP 136/90 | HR 81 | Ht 74.0 in | Wt 219.6 lb

## 2020-07-26 DIAGNOSIS — Z96629 Presence of unspecified artificial elbow joint: Secondary | ICD-10-CM | POA: Insufficient documentation

## 2020-07-26 DIAGNOSIS — E785 Hyperlipidemia, unspecified: Secondary | ICD-10-CM | POA: Insufficient documentation

## 2020-07-26 DIAGNOSIS — G4733 Obstructive sleep apnea (adult) (pediatric): Secondary | ICD-10-CM | POA: Insufficient documentation

## 2020-07-26 DIAGNOSIS — Z96659 Presence of unspecified artificial knee joint: Secondary | ICD-10-CM | POA: Diagnosis not present

## 2020-07-26 DIAGNOSIS — Z87891 Personal history of nicotine dependence: Secondary | ICD-10-CM | POA: Diagnosis not present

## 2020-07-26 DIAGNOSIS — Z955 Presence of coronary angioplasty implant and graft: Secondary | ICD-10-CM | POA: Insufficient documentation

## 2020-07-26 DIAGNOSIS — I4892 Unspecified atrial flutter: Secondary | ICD-10-CM | POA: Diagnosis not present

## 2020-07-26 DIAGNOSIS — Z7984 Long term (current) use of oral hypoglycemic drugs: Secondary | ICD-10-CM | POA: Insufficient documentation

## 2020-07-26 DIAGNOSIS — Z9989 Dependence on other enabling machines and devices: Secondary | ICD-10-CM | POA: Insufficient documentation

## 2020-07-26 DIAGNOSIS — I10 Essential (primary) hypertension: Secondary | ICD-10-CM | POA: Diagnosis not present

## 2020-07-26 DIAGNOSIS — Z79899 Other long term (current) drug therapy: Secondary | ICD-10-CM | POA: Insufficient documentation

## 2020-07-26 DIAGNOSIS — Z79891 Long term (current) use of opiate analgesic: Secondary | ICD-10-CM | POA: Diagnosis not present

## 2020-07-26 DIAGNOSIS — I2510 Atherosclerotic heart disease of native coronary artery without angina pectoris: Secondary | ICD-10-CM | POA: Diagnosis not present

## 2020-07-26 DIAGNOSIS — K900 Celiac disease: Secondary | ICD-10-CM | POA: Diagnosis not present

## 2020-07-26 DIAGNOSIS — D6869 Other thrombophilia: Secondary | ICD-10-CM

## 2020-07-26 DIAGNOSIS — E119 Type 2 diabetes mellitus without complications: Secondary | ICD-10-CM | POA: Insufficient documentation

## 2020-07-26 DIAGNOSIS — I483 Typical atrial flutter: Secondary | ICD-10-CM

## 2020-07-26 DIAGNOSIS — Z7901 Long term (current) use of anticoagulants: Secondary | ICD-10-CM | POA: Insufficient documentation

## 2020-07-26 HISTORY — DX: Other thrombophilia: D68.69

## 2020-07-26 LAB — BASIC METABOLIC PANEL
Anion gap: 10 (ref 5–15)
BUN: 9 mg/dL (ref 8–23)
CO2: 27 mmol/L (ref 22–32)
Calcium: 9.9 mg/dL (ref 8.9–10.3)
Chloride: 103 mmol/L (ref 98–111)
Creatinine, Ser: 0.88 mg/dL (ref 0.61–1.24)
GFR, Estimated: >60 mL/min (ref >60)
Glucose, Bld: 147 mg/dL — ABNORMAL HIGH (ref 70–99)
Potassium: 3.9 mmol/L (ref 3.5–5.1)
Sodium: 140 mmol/L (ref 135–145)

## 2020-07-26 LAB — CBC
HCT: 43.6 % (ref 39.0–52.0)
Hemoglobin: 15 g/dL (ref 13.0–17.0)
MCH: 30.2 pg (ref 26.0–34.0)
MCHC: 34.4 g/dL (ref 30.0–36.0)
MCV: 87.9 fL (ref 80.0–100.0)
Platelets: 211 10*3/uL (ref 150–400)
RBC: 4.96 MIL/uL (ref 4.22–5.81)
RDW: 13.6 % (ref 11.5–15.5)
WBC: 4.5 10*3/uL (ref 4.0–10.5)
nRBC: 0 % (ref 0.0–0.2)

## 2020-07-26 NOTE — Patient Instructions (Signed)
Cardioversion scheduled for Wednesday, February 23rd  - Arrive at the [facility] and go to admitting at [facility] not eat or drink anything after midnight the night prior to your procedure.  - Take all your morning medication (except diabetic medications) with a sip of water prior to arrival.  - You will not be able to drive home after your procedure.  - Do NOT miss any doses of your blood thinner - if you should miss a dose please notify our office immediately.  - If you feel as if you go back into normal rhythm prior to scheduled cardioversion, please notify our office immediately. If your procedure is canceled in the cardioversion suite you will be charged a cancellation fee.

## 2020-07-29 ENCOUNTER — Other Ambulatory Visit: Payer: Self-pay | Admitting: Cardiology

## 2020-07-29 DIAGNOSIS — I480 Paroxysmal atrial fibrillation: Secondary | ICD-10-CM

## 2020-07-30 ENCOUNTER — Other Ambulatory Visit (HOSPITAL_COMMUNITY)
Admission: RE | Admit: 2020-07-30 | Discharge: 2020-07-30 | Disposition: A | Discharge status: Home / Self Care | Payer: Medicare Other | Source: Ambulatory Visit | Attending: Internal Medicine | Admitting: Internal Medicine

## 2020-07-30 DIAGNOSIS — Z01812 Encounter for preprocedural laboratory examination: Secondary | ICD-10-CM | POA: Insufficient documentation

## 2020-07-30 DIAGNOSIS — Z20822 Contact with and (suspected) exposure to covid-19: Secondary | ICD-10-CM | POA: Insufficient documentation

## 2020-07-31 ENCOUNTER — Ambulatory Visit (HOSPITAL_COMMUNITY): Payer: Medicare Other | Admitting: Physician Assistant

## 2020-07-31 LAB — SARS CORONAVIRUS 2 (TAT 6-24 HRS): SARS Coronavirus 2: NEGATIVE

## 2020-08-01 ENCOUNTER — Ambulatory Visit (HOSPITAL_COMMUNITY): Payer: Medicare Other | Admitting: Anesthesiology

## 2020-08-01 ENCOUNTER — Ambulatory Visit (HOSPITAL_COMMUNITY)
Admission: RE | Admit: 2020-08-01 | Discharge: 2020-08-01 | Disposition: A | Discharge status: Home / Self Care | Payer: Medicare Other | Source: Ambulatory Visit | Attending: Internal Medicine | Admitting: Internal Medicine

## 2020-08-01 ENCOUNTER — Other Ambulatory Visit: Payer: Self-pay

## 2020-08-01 ENCOUNTER — Encounter (HOSPITAL_COMMUNITY)
Admission: RE | Disposition: A | Discharge status: Home / Self Care | Payer: Self-pay | Source: Ambulatory Visit | Attending: Internal Medicine

## 2020-08-01 ENCOUNTER — Encounter (HOSPITAL_COMMUNITY): Payer: Self-pay | Admitting: Internal Medicine

## 2020-08-01 DIAGNOSIS — G4733 Obstructive sleep apnea (adult) (pediatric): Secondary | ICD-10-CM | POA: Diagnosis not present

## 2020-08-01 DIAGNOSIS — I2510 Atherosclerotic heart disease of native coronary artery without angina pectoris: Secondary | ICD-10-CM | POA: Insufficient documentation

## 2020-08-01 DIAGNOSIS — Z79899 Other long term (current) drug therapy: Secondary | ICD-10-CM | POA: Diagnosis not present

## 2020-08-01 DIAGNOSIS — Z7901 Long term (current) use of anticoagulants: Secondary | ICD-10-CM | POA: Diagnosis not present

## 2020-08-01 DIAGNOSIS — I483 Typical atrial flutter: Secondary | ICD-10-CM | POA: Diagnosis not present

## 2020-08-01 DIAGNOSIS — Z880 Allergy status to penicillin: Secondary | ICD-10-CM | POA: Insufficient documentation

## 2020-08-01 DIAGNOSIS — I10 Essential (primary) hypertension: Secondary | ICD-10-CM | POA: Insufficient documentation

## 2020-08-01 DIAGNOSIS — D6869 Other thrombophilia: Secondary | ICD-10-CM | POA: Insufficient documentation

## 2020-08-01 DIAGNOSIS — I4892 Unspecified atrial flutter: Secondary | ICD-10-CM | POA: Insufficient documentation

## 2020-08-01 DIAGNOSIS — Z87891 Personal history of nicotine dependence: Secondary | ICD-10-CM | POA: Insufficient documentation

## 2020-08-01 DIAGNOSIS — Z888 Allergy status to other drugs, medicaments and biological substances status: Secondary | ICD-10-CM | POA: Diagnosis not present

## 2020-08-01 DIAGNOSIS — Z7984 Long term (current) use of oral hypoglycemic drugs: Secondary | ICD-10-CM | POA: Diagnosis not present

## 2020-08-01 HISTORY — PX: CARDIOVERSION: SHX1299

## 2020-08-01 SURGERY — CARDIOVERSION
Anesthesia: General

## 2020-08-01 MED ORDER — SODIUM CHLORIDE 0.9 % IV SOLN: INTRAVENOUS | Status: DC | PRN

## 2020-08-01 MED ORDER — PROPOFOL 10 MG/ML IV BOLUS
INTRAVENOUS | Status: DC | PRN
  Administered 2020-08-01: 20 mg via INTRAVENOUS
  Administered 2020-08-01: 65 mg via INTRAVENOUS
  Administered 2020-08-01: 70 mg via INTRAVENOUS
  Administered 2020-08-01: 35 mg via INTRAVENOUS

## 2020-08-01 MED ORDER — SODIUM CHLORIDE 0.9 % IV SOLN
INTRAVENOUS | Status: AC | PRN
  Administered 2020-08-01: 500 mL via INTRAVENOUS

## 2020-08-01 MED ORDER — LIDOCAINE HCL (CARDIAC) PF 100 MG/5ML IV SOSY
PREFILLED_SYRINGE | INTRAVENOUS | Status: DC | PRN
  Administered 2020-08-01: 40 mg via INTRATRACHEAL

## 2020-08-01 NOTE — Anesthesia Postprocedure Evaluation (Signed)
Anesthesia Post Note  Patient: Michael Hartman  Procedure(s) Performed: CARDIOVERSION (N/A )     Patient location during evaluation: PACU Anesthesia Type: General Level of consciousness: awake and alert Pain management: pain level controlled Vital Signs Assessment: post-procedure vital signs reviewed and stable Respiratory status: spontaneous breathing, nonlabored ventilation, respiratory function stable and patient connected to nasal cannula oxygen Cardiovascular status: blood pressure returned to baseline and stable Postop Assessment: no apparent nausea or vomiting Anesthetic complications: no   No complications documented.  Last Vitals:  Vitals:   08/01/20 1225 08/01/20 1235  BP: 116/84 110/82  Pulse: 66 (!) 42  Resp: 14 10  Temp:    SpO2: 96% 95%    Last Pain:  Vitals:   08/01/20 1235  TempSrc:   PainSc: 0-No pain                 Shelton Silvas

## 2020-08-01 NOTE — Anesthesia Preprocedure Evaluation (Addendum)
Anesthesia Evaluation  Patient identified by MRN, date of birth, ID band Patient awake    Reviewed: Allergy & Precautions, NPO status , Patient's Chart, lab work & pertinent test results  Airway Mallampati: II  TM Distance: >3 FB Neck ROM: Full    Dental  (+) Teeth Intact, Dental Advisory Given   Pulmonary sleep apnea , former smoker,    breath sounds clear to auscultation       Cardiovascular hypertension, + CAD and + Cardiac Stents  + dysrhythmias Atrial Fibrillation  Rhythm:Irregular Rate:Abnormal     Neuro/Psych negative neurological ROS  negative psych ROS   GI/Hepatic Neg liver ROS, GERD  ,  Endo/Other  diabetes  Renal/GU negative Renal ROS  negative genitourinary   Musculoskeletal  (+) Arthritis ,   Abdominal Normal abdominal exam  (+)   Peds negative pediatric ROS (+)  Hematology negative hematology ROS (+)   Anesthesia Other Findings   Reproductive/Obstetrics                            Anesthesia Physical Anesthesia Plan  ASA: III  Anesthesia Plan: General   Post-op Pain Management:    Induction: Intravenous  PONV Risk Score and Plan: 0  Airway Management Planned: Natural Airway and Simple Face Mask  Additional Equipment: None  Intra-op Plan:   Post-operative Plan:   Informed Consent: I have reviewed the patients History and Physical, chart, labs and discussed the procedure including the risks, benefits and alternatives for the proposed anesthesia with the patient or authorized representative who has indicated his/her understanding and acceptance.       Plan Discussed with: CRNA  Anesthesia Plan Comments: (Echo:  - Left ventricle: The cavity size was mildly dilated. There was  mild focal basal hypertrophy of the septum. Systolic function was  normal. The estimated ejection fraction was 55%. Wall motion was  normal; there were no regional wall motion  abnormalities.  - Aortic valve: Trileaflet; mildly thickened, mildly calcified  leaflets.  - Mitral valve: Calcified annulus.  - Left atrium: The atrium was moderately dilated.  - Right ventricle: The cavity size was mildly dilated. Wall  thickness was normal.  - Pulmonic valve: There was trivial regurgitation. )       Anesthesia Quick Evaluation

## 2020-08-01 NOTE — Interval H&P Note (Signed)
History and Physical Interval Note:  08/01/2020 11:44 AM  Michael Hartman  has presented today for surgery, with the diagnosis of A-FIB.  The various methods of treatment have been discussed with the patient and family. After consideration of risks, benefits and other options for treatment, the patient has consented to  Procedure(s): CARDIOVERSION (N/A) as a surgical intervention.  The patient's history has been reviewed, patient examined, no change in status, stable for surgery.  I have reviewed the patient's chart and labs.  Questions were answered to the patient's satisfaction.     [name]

## 2020-08-01 NOTE — Anesthesia Procedure Notes (Signed)
Procedure Name: General with mask airway Date/Time: 08/01/2020 11:48 AM Performed by: Modena Morrow, CRNA Pre-anesthesia Checklist: Patient identified, Emergency Drugs available, Suction available and Patient being monitored Patient Re-evaluated:Patient Re-evaluated prior to induction Oxygen Delivery Method: Ambu bag Preoxygenation: Pre-oxygenation with 100% oxygen Induction Type: IV induction Ventilation: Mask ventilation without difficulty Placement Confirmation: positive ETCO2 Dental Injury: Teeth and Oropharynx as per pre-operative assessment

## 2020-08-01 NOTE — Discharge Instructions (Signed)
Electrical Cardioversion Electrical cardioversion is the delivery of a jolt of electricity to restore a normal rhythm to the heart. A rhythm that is too fast or is not regular keeps the heart from pumping well. In this procedure, sticky patches or metal paddles are placed on the chest to deliver electricity to the heart from a device. This procedure may be done in an emergency if:  There is low or no blood pressure as a result of the heart rhythm.  Normal rhythm must be restored as fast as possible to protect the brain and heart from further damage.  It may save a life. This may also be a scheduled procedure for irregular or fast heart rhythms that are not immediately life-threatening. Tell a health care provider about:  Any allergies you have.  All medicines you are taking, including vitamins, herbs, eye drops, creams, and over-the-counter medicines.  Any problems you or family members have had with anesthetic medicines.  Any blood disorders you have.  Any surgeries you have had.  Any medical conditions you have.  Whether you are pregnant or may be pregnant. What are the risks? Generally, this is a safe procedure. However, problems may occur, including:  Allergic reactions to medicines.  A blood clot that breaks free and travels to other parts of your body.  The possible return of an abnormal heart rhythm within hours or days after the procedure.  Your heart stopping (cardiac arrest). This is rare. What happens before the procedure? Medicines  Your health care provider may have you start taking: ? Blood-thinning medicines (anticoagulants) so your blood does not clot as easily. ? Medicines to help stabilize your heart rate and rhythm.  Ask your health care provider about: ? Changing or stopping your regular medicines. This is especially important if you are taking diabetes medicines or blood thinners. ? Taking medicines such as aspirin and ibuprofen. These medicines can  thin your blood. Do not take these medicines unless your health care provider tells you to take them. ? Taking over-the-counter medicines, vitamins, herbs, and supplements. General instructions  Follow instructions from your health care provider about eating or drinking restrictions.  Plan to have someone take you home from the hospital or clinic.  If you will be going home right after the procedure, plan to have someone with you for 24 hours.  Ask your health care provider what steps will be taken to help prevent infection. These may include washing your skin with a germ-killing soap. What happens during the procedure?  An IV will be inserted into one of your veins.  Sticky patches (electrodes) or metal paddles may be placed on your chest.  You will be given a medicine to help you relax (sedative).  An electrical shock will be delivered. The procedure may vary among health care providers and hospitals.   What can I expect after the procedure?  Your blood pressure, heart rate, breathing rate, and blood oxygen level will be monitored until you leave the hospital or clinic.  Your heart rhythm will be watched to make sure it does not change.  You may have some redness on the skin where the shocks were given. Follow these instructions at home:  Do not drive for 24 hours if you were given a sedative during your procedure.  Take over-the-counter and prescription medicines only as told by your health care provider.  Ask your health care provider how to check your pulse. Check it often.  Rest for 48 hours after the procedure   or as told by your health care provider.  Avoid or limit your caffeine use as told by your health care provider.  Keep all follow-up visits as told by your health care provider. This is important. Contact a health care provider if:  You feel like your heart is beating too quickly or your pulse is not regular.  You have a serious muscle cramp that does not go  away. Get help right away if:  You have discomfort in your chest.  You are dizzy or you feel faint.  You have trouble breathing or you are short of breath.  Your speech is slurred.  You have trouble moving an arm or leg on one side of your body.  Your fingers or toes turn cold or blue. Summary  Electrical cardioversion is the delivery of a jolt of electricity to restore a normal rhythm to the heart.  This procedure may be done right away in an emergency or may be a scheduled procedure if the condition is not an emergency.  Generally, this is a safe procedure.  After the procedure, check your pulse often as told by your health care provider. This information is not intended to replace advice given to you by your health care provider. Make sure you discuss any questions you have with your health care provider. Document Revised: 12/27/2018 Document Reviewed: 12/27/2018 Elsevier Patient Education  2021 Elsevier Inc.  

## 2020-08-01 NOTE — CV Procedure (Signed)
Procedure: Electrical Cardioversion Indications:  Atrial Flutter  Procedure Details:  Consent: Risks of procedure as well as the alternatives and risks of each were explained to the (patient/caregiver).  Consent for procedure obtained.  Time Out: Verified patient identification, verified procedure, site/side was marked, verified correct patient position, special equipment/implants available, medications/allergies/relevent history reviewed, required imaging and test results available. PERFORMED.  Patient placed on cardiac monitor, pulse oximetry, supplemental oxygen as necessary.  Sedation given: propofol per anesthesia Pacer pads placed anterior and posterior chest.  Cardioverted 3 time(s).  Cardioversion with synchronized biphasic 120J, 150J and 200 J shock.  Evaluation: Findings: Post procedure EKG shows: Atrial Fibrillation. Patient converted from atrial flutter to atrial fibrillation. Unsuccessful cardioversion. Complications: None Patient did tolerate procedure well.  Time Spent Directly with the Patient:  45 minutes   [name] 08/01/2020, 12:34 PM

## 2020-08-01 NOTE — Transfer of Care (Signed)
Immediate Anesthesia Transfer of Care Note  Patient: Michael Hartman  Procedure(s) Performed: CARDIOVERSION (N/A )  Patient Location: Endoscopy Unit  Anesthesia Type:General  Level of Consciousness: drowsy and patient cooperative  Airway & Oxygen Therapy: Patient Spontanous Breathing  Post-op Assessment: Report given to RN and Post -op Vital signs reviewed and stable  Post vital signs: Reviewed and stable  Last Vitals:  Vitals Value Taken Time  BP 125/76   Temp    Pulse 74   Resp 16   SpO2 93     Last Pain:  Vitals:   08/01/20 1125  TempSrc: Temporal  PainSc: 0-No pain         Complications: No complications documented.

## 2020-08-05 ENCOUNTER — Other Ambulatory Visit: Payer: Self-pay | Admitting: Cardiology

## 2020-08-05 DIAGNOSIS — I480 Paroxysmal atrial fibrillation: Secondary | ICD-10-CM

## 2020-08-07 ENCOUNTER — Other Ambulatory Visit: Payer: Medicare Other

## 2020-08-10 ENCOUNTER — Ambulatory Visit (HOSPITAL_COMMUNITY): Payer: Medicare Other | Attending: Cardiology

## 2020-08-10 ENCOUNTER — Other Ambulatory Visit: Payer: Self-pay

## 2020-08-10 DIAGNOSIS — I2510 Atherosclerotic heart disease of native coronary artery without angina pectoris: Secondary | ICD-10-CM | POA: Diagnosis not present

## 2020-08-10 DIAGNOSIS — I480 Paroxysmal atrial fibrillation: Secondary | ICD-10-CM | POA: Diagnosis not present

## 2020-08-10 LAB — ECHOCARDIOGRAM COMPLETE
Area-P 1/2: 3.64 cm2
S' Lateral: 3.4 cm

## 2020-08-13 ENCOUNTER — Encounter: Payer: Medicare Other | Admitting: Psychology

## 2020-08-28 ENCOUNTER — Ambulatory Visit (INDEPENDENT_AMBULATORY_CARE_PROVIDER_SITE_OTHER): Payer: Medicare Other | Admitting: Cardiology

## 2020-08-28 ENCOUNTER — Encounter: Payer: Self-pay | Admitting: Cardiology

## 2020-08-28 ENCOUNTER — Other Ambulatory Visit: Payer: Self-pay

## 2020-08-28 VITALS — BP 114/72 | HR 67 | Ht 74.0 in | Wt 219.8 lb

## 2020-08-28 DIAGNOSIS — Z01812 Encounter for preprocedural laboratory examination: Secondary | ICD-10-CM

## 2020-08-28 DIAGNOSIS — I4819 Other persistent atrial fibrillation: Secondary | ICD-10-CM

## 2020-08-28 NOTE — Patient Instructions (Signed)
Medication Instructions:  Your physician recommends that you continue on your current medications as directed. Please refer to the Current Medication list given to you today.  *If you need a refill on your cardiac medications before your next appointment, please call your pharmacy*   Lab Work: None ordered If you have labs (blood work) drawn today and your tests are completely normal, you will receive your results only by: Marland Kitchen MyChart Message (if you have MyChart) OR . A paper copy in the mail If you have any lab test that is abnormal or we need to change your treatment, we will call you to review the results.   Testing/Procedures: Your physician has requested that you have cardiac CT within 7 days PRIOR to your ablation. Cardiac computed tomography (CT) is a painless test that uses an x-ray machine to take clear, detailed pictures of your heart. Please follow instruction sheet below located under "other instructions".   Your physician has recommended that you have an ablation. Catheter ablation is a medical procedure used to treat some cardiac arrhythmias (irregular heartbeats). During catheter ablation, a long, thin, flexible tube is put into a blood vessel in your groin (upper thigh), or neck. This tube is called an ablation catheter. It is then guided to your heart through the blood vessel. Radio frequency waves destroy small areas of heart tissue where abnormal heartbeats may cause an arrhythmia to start. Please follow instruction sheet below located under "other instructions".    Follow-Up: At [facility], you and your health needs are our priority.  As part of our continuing mission to provide you with exceptional heart care, we have created designated Provider Care Teams.  These Care Teams include your primary Cardiologist (physician) and Advanced Practice Providers (APPs -  Physician Assistants and Nurse Practitioners) who all work together to provide you with the care you need, when  you need it.  Your next appointment:   4 week(s) after your ablation  The format for your next appointment:   In Person  Provider:   You will follow up in the [facility] located at [facility]. Your provider will be: Roderic Palau, NP or Clint R. Fenton, PA-C    Thank you for choosing [facility]!!   Trinidad Curet, RN [phone]   Other Instructions  CT INSTRUCTIONS Your cardiac CT will be scheduled at:  [facility] [address] [place], [place] [address] [phone]  Please arrive at the [facility] main entrance of [facility] 30 minutes prior to test start time. Proceed to the [facility] Radiology Department (first floor) to check-in and test prep.  Please follow these instructions carefully (unless otherwise directed):  Hold all erectile dysfunction medications at least 3 days (72 hrs) prior to test.  On the Night Before the Test: . Be sure to Drink plenty of water. . Do not consume any caffeinated/decaffeinated beverages or chocolate 12 hours prior to your test. . Do not take any antihistamines 12 hours prior to your test.  On the Day of the Test: . Drink plenty of water.  . Do not drink any water within one hour of the test. . Do not eat any food 4 hours prior to the test. . You may take your regular medications prior to the test.  . Take metoprolol (Lopressor) 100 mg two hours prior to test. . HOLD Furosemide/Hydrochlorothiazide morning of the test.      After the Test: . Drink plenty of water. Marland Kitchen  After receiving IV contrast, you may experience a mild flushed feeling. This is normal. . On occasion, you may experience a mild rash up to 24 hours after the test. This is not dangerous. If this occurs, you can take Benadryl 25 mg and increase your fluid intake. . If you experience trouble breathing, this can be serious. If it is severe call 911 IMMEDIATELY. If it is mild, please call our office. . If you  take any of these medications: Glipizide/Metformin, Avandament, Glucavance, please do not take 48 hours after completing test unless otherwise instructed.   Once we have confirmed authorization from your insurance company, we will call you to set up a date and time for your test. Based on how quickly your insurance processes prior authorizations requests, please allow up to 4 weeks to be contacted for scheduling your Cardiac CT appointment. Be advised that routine Cardiac CT appointments could be scheduled as many as 8 weeks after your provider has ordered it.  For non-scheduling related questions, please contact the cardiac imaging nurse navigator should you have any questions/concerns: Rockwell Alexandria, Cardiac Imaging Nurse Navigator Mitzi Hansen, Interim Cardiac Imaging Nurse Navigator [facility] Direct Office Dial: [phone]   For scheduling needs, including cancellations and rescheduling, please call [place], [phone].     Electrophysiology/Ablation Procedure Instructions   You are scheduled for a(n)  ablation on 10/24/2020 with Dr. Loman Brooklyn.   1.   Pre procedure testing-             A.  LAB WORK --- On 10/04/2020  for your pre procedure blood work.  You do NOT need to be fasting.  You can stop by the [facility] office anytime between 7:30 am - 4:30 pm.               B. COVID TEST-- On 10/22/20 @ 12:30 pm - This is a Drive Up Visit at [address] [place], [place] [address].  Someone will direct you to the appropriate testing line. Stay in your car and someone will be with you shortly.   After you are tested please go home and self quarantine until the day of your procedure.     PROCEDURE DAY: 2. On the day of your procedure 10/24/2020 you will go to [facility] [phone]) at 6:30 am.  [name] will go to the main entrance [address] [employer]) and enter where the [employer] are.  Your driver will drop you off and you will head down the  hallway to ADMITTING.  You may have one support person come in to the hospital with you.  They will be asked to wait in the waiting room.  It is OK to have someone drop you off and come back when you are ready to be discharged.   3.   Do not eat or drink after midnight prior to your procedure.   4.   Do not miss any doses of your blood thinner prior to the morning of your procedure or your procedure will need to be rescheduled.       Do NOT take any medications the morning of your procedure.   5.  Plan for an overnight stay, but you may be discharged home after your procedure. If you use your phone frequently bring your phone charger, in case you have to stay.  If you are discharged after your procedure you will need someone to drive you home and be with  your for 24 hours after your procedure.   6. You will follow up with the AFIB clinic 4 weeks after your procedure.  You will follow up with Dr. Curt Bears  3 months after your procedure.  These appointments will be made for you.   * If you have ANY questions please call the office [phone] and ask for Davielle Lingelbach RN or send me a MyChart message   * Occasionally, EP Studies and ablations can become lengthy.  Please make your family aware of this before your procedure starts.  Average time ranges from 2-8 hours for EP studies/ablations.  Your physician will call your family after the procedure with the results.                                    Cardiac Ablation Cardiac ablation is a procedure to destroy, or ablate, a small amount of heart tissue in very specific places. The heart has many electrical connections. Sometimes these connections are abnormal and can cause the heart to beat very fast or irregularly. Ablating some of the areas that cause problems can improve the heart's rhythm or return it to normal. Ablation may be done for people who:  Have Wolff-Parkinson-White syndrome.  Have fast heart rhythms (tachycardia).  Have taken medicines  for an abnormal heart rhythm (arrhythmia) that were not effective or caused side effects.  Have a high-risk heartbeat that may be life-threatening. During the procedure, a small incision is made in the neck or the groin, and a long, thin tube (catheter) is inserted into the incision and moved to the heart. Small devices (electrodes) on the tip of the catheter will send out electrical currents. A type of X-ray (fluoroscopy) will be used to help guide the catheter and to provide images of the heart. Tell a health care provider about:  Any allergies you have.  All medicines you are taking, including vitamins, herbs, eye drops, creams, and over-the-counter medicines.  Any problems you or family members have had with anesthetic medicines.  Any blood disorders you have.  Any surgeries you have had.  Any medical conditions you have, such as kidney failure.  Whether you are pregnant or may be pregnant. What are the risks? Generally, this is a safe procedure. However, problems may occur, including:  Infection.  Bruising and bleeding at the catheter insertion site.  Bleeding into the chest, especially into the sac that surrounds the heart. This is a serious complication.  Stroke or blood clots.  Damage to nearby structures or organs.  Allergic reaction to medicines or dyes.  Need for a permanent pacemaker if the normal electrical system is damaged. A pacemaker is a small computer that sends electrical signals to the heart and helps your heart beat normally.  The procedure not being fully effective. This may not be recognized until months later. Repeat ablation procedures are sometimes done. What happens before the procedure? Medicines Ask your health care provider about:  Changing or stopping your regular medicines. This is especially important if you are taking diabetes medicines or blood thinners.  Taking medicines such as aspirin and ibuprofen. These medicines can thin your blood.  Do not take these medicines unless your health care provider tells you to take them.  Taking over-the-counter medicines, vitamins, herbs, and supplements. General instructions  Follow instructions from your health care provider about eating or drinking restrictions.  Plan to have someone take you home from  the hospital or clinic.  If you will be going home right after the procedure, plan to have someone with you for 24 hours.  Ask your health care provider what steps will be taken to prevent infection. What happens during the procedure?  An IV will be inserted into one of your veins.  You will be given a medicine to help you relax (sedative).  The skin on your neck or groin will be numbed.  An incision will be made in your neck or your groin.  A needle will be inserted through the incision and into a large vein in your neck or groin.  A catheter will be inserted into the needle and moved to your heart.  Dye may be injected through the catheter to help your surgeon see the area of the heart that needs treatment.  Electrical currents will be sent from the catheter to ablate heart tissue in desired areas. There are three types of energy that may be used to do this: ? Heat (radiofrequency energy). ? Laser energy. ? Extreme cold (cryoablation).  When the tissue has been ablated, the catheter will be removed.  Pressure will be held on the insertion area to prevent a lot of bleeding.  A bandage (dressing) will be placed over the insertion area. The exact procedure may vary among health care providers and hospitals.   What happens after the procedure?  Your blood pressure, heart rate, breathing rate, and blood oxygen level will be monitored until you leave the hospital or clinic.  Your insertion area will be monitored for bleeding. You will need to lie still for a few hours to ensure that you do not bleed from the insertion area.  Do not drive for 24 hours or as long as told by  your health care provider. Summary  Cardiac ablation is a procedure to destroy, or ablate, a small amount of heart tissue using an electrical current. This procedure can improve the heart rhythm or return it to normal.  Tell your health care provider about any medical conditions you may have and all medicines you are taking to treat them.  This is a safe procedure, but problems may occur. Problems may include infection, bruising, damage to nearby organs or structures, or allergic reactions to medicines.  Follow your health care provider's instructions about eating and drinking before the procedure. You may also be told to change or stop some of your medicines.  After the procedure, do not drive for 24 hours or as long as told by your health care provider. This information is not intended to replace advice given to you by your health care provider. Make sure you discuss any questions you have with your health care provider. Document Revised: 04/04/2019 Document Reviewed: 04/04/2019 Elsevier Patient Education  [place].

## 2020-08-28 NOTE — Progress Notes (Signed)
Electrophysiology Office Note   Date:  08/28/2020   ID:  Michael Hartman, Michael Hartman April 30, 1956, MRN [id number]  PCP:  Gaspar Garbe, MD  Cardiologist:  [place] Primary Electrophysiologist:  Regan Lemming, MD    Chief Complaint: AFl   History of Present Illness: Michael Hartman is a 65 y.o. male who is being seen today for the evaluation of atrial flutter at the request of Fenton, Clint R, PA. Presenting today for electrophysiology evaluation.  He has a history of sleep apnea, hypertension, hyperlipidemia, Beatties, coronary disease, celiac disease, and atrial flutter.  He was diagnosed with atrial flutter in 2018.  He had a cardioversion 01/08/2017.  He is currently on Eliquis.  He was found to be back in atrial flutter 07/16/2020.  He had increased fatigue.  He is now status post cardioversion 08/01/2020.  Today, he denies symptoms of palpitations, chest pain, shortness of breath, orthopnea, PND, lower extremity edema, claudication, dizziness, presyncope, syncope, bleeding, or neurologic sequela. The patient is tolerating medications without difficulties.  By history of his cardioversion, he was in atrial fibrillation.  Unfortunately, he has continued to have atrial fibrillation after his cardioversion.  He would prefer to avoid medications and would prefer ablation.   Past Medical History:  Diagnosis Date  . Allergy   . Arthritis   . Atrial flutter (HCC)    a. diagnosed in 11/2016 --> started on Eliquis for anticoagulation b. s/p DCCV in 01/2017  . Celiac disease   . Chronic rhinitis   . Coronary artery disease    a. s/p remote stenting of RCA in 1998  . Diabetes mellitus without complication (HCC)   . GERD (gastroesophageal reflux disease)   . History of migraine headaches   . Hypercholesteremia   . Hypertension   . OSA (obstructive sleep apnea)    Past Surgical History:  Procedure Laterality Date  . ANKLE ARTHROPLASTY    . CARDIAC CATHETERIZATION  06/06/1997   EF 65%  .  CARDIOVASCULAR STRESS TEST  12/07/2007   EF 58%  . CARDIOVERSION N/A 01/08/2017   Procedure: CARDIOVERSION;  Surgeon: Wendall Stade, MD;  Location: [facility] ENDOSCOPY;  Service: Cardiovascular;  Laterality: N/A;  . CARDIOVERSION N/A 08/01/2020   Procedure: CARDIOVERSION;  Surgeon: [name], MD;  Location: [facility] ENDOSCOPY;  Service: Cardiovascular;  Laterality: N/A;  . CORONARY STENT PLACEMENT    . ELBOW ARTHROPLASTY    . EYE SURGERY    . KNEE ARTHROPLASTY    . NASAL SINUS SURGERY    . SHOULDER ACROMIOPLASTY    . SPINE SURGERY       Current Outpatient Medications  Medication Sig Dispense Refill  . acetaminophen (TYLENOL) 500 MG tablet Take 1,000 mg by mouth every 6 (six) hours as needed for mild pain or headache.    Marland Kitchen atorvastatin (LIPITOR) 80 MG tablet TAKE 1 TABLET BY MOUTH  DAILY 90 tablet 0  . CARTIA XT 180 MG 24 hr capsule TAKE 1 CAPSULE BY MOUTH  DAILY 90 capsule 3  . docusate sodium (COLACE) 100 MG capsule Take 100 mg by mouth daily.    Marland Kitchen ELIQUIS 5 MG TABS tablet TAKE 1 TABLET BY MOUTH  TWICE DAILY 180 tablet 3  . FARXIGA 10 MG TABS tablet Take 10 mg by mouth daily.    . fluticasone (FLONASE) 50 MCG/ACT nasal spray Place 1 spray into both nostrils daily as needed for rhinitis.    . metFORMIN (GLUCOPHAGE) 500 MG tablet Take 500 mg by mouth 2 (  two) times daily.    . methocarbamol (ROBAXIN) 500 MG tablet Take 500 mg by mouth 2 (two) times daily as needed for muscle spasms.    . metoprolol tartrate (LOPRESSOR) 50 MG tablet TAKE 1 TABLET BY MOUTH  TWICE DAILY 180 tablet 3  . morphine (KADIAN) 50 MG 24 hr capsule Take 50 mg by mouth every 12 (twelve) hours.  0  . Multiple Vitamin (MULTIVITAMIN) capsule Take 1 capsule by mouth daily.    . nitroGLYCERIN (NITROSTAT) 0.4 MG SL tablet Place 1 tablet (0.4 mg total) under the tongue every 5 (five) minutes as needed for chest pain. 25 tablet 11  . ONE TOUCH ULTRA TEST test strip 1 each by Other route as needed. Use 1 strip to check glucose  daily 100 each 4  . oxyCODONE-acetaminophen (PERCOCET) 10-325 MG per tablet Take 1 tablet by mouth 3 (three) times daily as needed for pain.    . TRADJENTA 5 MG TABS tablet 5 mg daily.      No current facility-administered medications for this visit.    Allergies:   Ambien [zolpidem tartrate], Amoxicillin, and Penicillins   Social History:  The patient  reports that he quit smoking about 35 years ago. He has a 80.00 pack-year smoking history. He has quit using smokeless tobacco. He reports that he does not drink alcohol and does not use drugs.   Family History:  The patient's family history includes Diabetes in his mother; Hypertension in his brother, father, and mother; Stroke in his father.    ROS:  Please see the history of present illness.   Otherwise, review of systems is positive for none.   All other systems are reviewed and negative.    PHYSICAL EXAM: VS:  BP 114/72   Pulse 67   Ht 6\' 2"  (1.88 m)   Wt 219 lb 12.8 oz (99.7 kg)   SpO2 95%   BMI 28.22 kg/m  , BMI Body mass index is 28.22 kg/m. GEN: Well nourished, well developed, in no acute distress  HEENT: normal  Neck: no JVD, carotid bruits, or masses Cardiac: RRR; no murmurs, rubs, or gallops,no edema  Respiratory:  clear to auscultation bilaterally, normal work of breathing GI: soft, nontender, nondistended, + BS MS: no deformity or atrophy  Skin: warm and dry Neuro:  Strength and sensation are intact Psych: euthymic mood, full affect  EKG:  EKG is ordered today. Personal review of the ekg ordered shows atrial fibrillation, rate 67  Recent Labs: 07/26/2020: BUN 9; Creatinine, Ser 0.88; Hemoglobin 15.0; Platelets 211; Potassium 3.9; Sodium 140    Lipid Panel     Component Value Date/Time   CHOL 136 11/18/2016 1030   TRIG 217 (H) 11/18/2016 1030   HDL 39 (L) 11/18/2016 1030   CHOLHDL 3.5 11/18/2016 1030   LDLCALC 54 11/18/2016 1030     Wt Readings from Last 3 Encounters:  08/28/20 219 lb 12.8 oz (99.7  kg)  08/01/20 210 lb (95.3 kg)  07/26/20 219 lb 9.6 oz (99.6 kg)      Other studies Reviewed: Additional studies/ records that were reviewed today include: TTE 08/10/20  Review of the above records today demonstrates:  1. Left ventricular ejection fraction, by estimation, is 60 to 65%. The  left ventricle has normal function. The left ventricle has no regional  wall motion abnormalities. Left ventricular diastolic parameters are  indeterminate.  2. Right ventricular systolic function is normal. The right ventricular  size is mildly enlarged. Tricuspid regurgitation signal is  inadequate for  assessing PA pressure.  3. Left atrial size was moderately dilated.  4. Right atrial size was moderately dilated.  5. The mitral valve is normal in structure. Trivial mitral valve  regurgitation. No evidence of mitral stenosis.  6. The aortic valve is tricuspid. Aortic valve regurgitation is not  visualized. No aortic stenosis is present.  7. The inferior vena cava is normal in size with greater than 50%  respiratory variability, suggesting right atrial pressure of 3 mmHg.    ASSESSMENT AND PLAN:  1.  Typical appearing atrial flutter/atrial fibrillation: CHA2DS2-VASc of 4.  Currently on Eliquis and metoprolol.  Has had 2 cardioversions.  At this point, he would prefer ablation.  Risks and benefits were discussed which include bleeding, tamponade, heart block, stroke, damage to chest organs.  The patient understands these risks and has agreed to the procedure.  2.  Obstructive sleep apnea: CPAP compliance encouraged  3.  Hypertension: Currently well controlled  4.  Coronary artery disease: No current chest pain.  Case discussed with primary cardiology  Current medicines are reviewed at length with the patient today.   The patient does not have concerns regarding his medicines.  The following changes were made today:  none  Labs/ tests ordered today include:  Orders Placed This  Encounter  Procedures  . CT CARDIAC MORPH/PULM VEIN W/CM&W/O CA SCORE  . Basic metabolic panel  . CBC  . EKG 12-Lead     Disposition:   FU with Hancel Ion 3 months  Signed, Krisandra Bueno Jorja Loa, MD  08/28/2020 10:14 AM     [facility] [address] [place] [address] [phone] (office) [phone] (fax)

## 2020-09-07 NOTE — Progress Notes (Signed)
Cardiology Office Note    Date:  09/11/2020   ID:  Michael Hartman, DOB 11-Oct-1955, MRN [id number]  PCP:  Gaspar Garbeisovec, Richard W, MD  Cardiologist: Dr. [place]   Chief Complaint  Patient presents with  . Atrial Fibrillation    History of Present Illness:    Michael Hartman is a 65 y.o. male with past medical history of CAD (s/p stenting of RCA in 1998), PAF (on Eliquis), HTN, HLD, Type 2 DM, and OSA (on CPAP) who presents to the office today for follow-up of atrial fibrillation and CAD.   He was diagnosed with Afib in June of 2018. Despite good rate control he remained symptomatic with fatigue.  He underwent DCCV x2 with 150J and 200J with conversion to NSR in August 2018.   He was found to be back in atrial flutter 07/16/2020.  He had increased fatigue.  He is now status post cardioversion 08/01/2020. He was seen by Dr Elberta Fortisamnitz on March 23 and was back in Afib. Ablation was discussed.  CTA is pending.   He denies any chest pain, palpitations, dyspnea or dizziness. He states that after his cardioversion he did feel better but isn't really aware of his AFib now.  He has lost 30 lbs over the past 2 years.    Past Medical History:  Diagnosis Date  . Allergy   . Arthritis   . Atrial flutter (HCC)    a. diagnosed in 11/2016 --> started on Eliquis for anticoagulation b. s/p DCCV in 01/2017  . Celiac disease   . Chronic rhinitis   . Coronary artery disease    a. s/p remote stenting of RCA in 1998  . Diabetes mellitus without complication (HCC)   . GERD (gastroesophageal reflux disease)   . History of migraine headaches   . Hypercholesteremia   . Hypertension   . OSA (obstructive sleep apnea)     Past Surgical History:  Procedure Laterality Date  . ANKLE ARTHROPLASTY    . CARDIAC CATHETERIZATION  06/06/1997   EF 65%  . CARDIOVASCULAR STRESS TEST  12/07/2007   EF 58%  . CARDIOVERSION N/A 01/08/2017   Procedure: CARDIOVERSION;  Surgeon: Wendall StadeNishan, Kerin Cecchi C, MD;  [facility];   Service: Cardiovascular;  Laterality: N/A;  . CARDIOVERSION N/A 08/01/2020   Procedure: CARDIOVERSION;  Surgeon: Parke PoissonAcharya, Gayatri A, MD;  [facility];  Service: Cardiovascular;  Laterality: N/A;  . CORONARY STENT PLACEMENT    . ELBOW ARTHROPLASTY    . EYE SURGERY    . KNEE ARTHROPLASTY    . NASAL SINUS SURGERY    . SHOULDER ACROMIOPLASTY    . SPINE SURGERY      Current Medications: Outpatient Medications Prior to Visit  Medication Sig Dispense Refill  . acetaminophen (TYLENOL) 500 MG tablet Take 1,000 mg by mouth every 6 (six) hours as needed for mild pain or headache.    Marland Kitchen. atorvastatin (LIPITOR) 80 MG tablet TAKE 1 TABLET BY MOUTH  DAILY 90 tablet 0  . CARTIA XT 180 MG 24 hr capsule TAKE 1 CAPSULE BY MOUTH  DAILY 90 capsule 3  . docusate sodium (COLACE) 100 MG capsule Take 100 mg by mouth daily.    Marland Kitchen. ELIQUIS 5 MG TABS tablet TAKE 1 TABLET BY MOUTH  TWICE DAILY 180 tablet 3  . FARXIGA 10 MG TABS tablet Take 10 mg by mouth daily.    . fluticasone (FLONASE) 50 MCG/ACT nasal spray Place 1 spray into both nostrils daily as needed for rhinitis.    .Marland Kitchen  metFORMIN (GLUCOPHAGE) 500 MG tablet Take 500 mg by mouth 2 (two) times daily.    . methocarbamol (ROBAXIN) 500 MG tablet Take 500 mg by mouth 2 (two) times daily as needed for muscle spasms.    . metoprolol tartrate (LOPRESSOR) 50 MG tablet TAKE 1 TABLET BY MOUTH  TWICE DAILY 180 tablet 3  . morphine (KADIAN) 50 MG 24 hr capsule Take 50 mg by mouth every 12 (twelve) hours.  0  . Multiple Vitamin (MULTIVITAMIN) capsule Take 1 capsule by mouth daily.    . nitroGLYCERIN (NITROSTAT) 0.4 MG SL tablet Place 1 tablet (0.4 mg total) under the tongue every 5 (five) minutes as needed for chest pain. 25 tablet 11  . ONE TOUCH ULTRA TEST test strip 1 each by Other route as needed. Use 1 strip to check glucose daily 100 each 4  . oxyCODONE-acetaminophen (PERCOCET) 10-325 MG per tablet Take 1 tablet by mouth 3 (three) times daily as needed for pain.     . TRADJENTA 5 MG TABS tablet 5 mg daily.      No facility-administered medications prior to visit.     Allergies:   Ambien [zolpidem tartrate], Amoxicillin, and Penicillins   Social History   Socioeconomic History  . Marital status: Married    Spouse name: Joyce Gross  . Number of children: 0  . Years of education: Not on file  . Highest education level: Not on file  Occupational History  . Occupation: DETENTION OFFICER    Employer: [employer]  Tobacco Use  . Smoking status: Former Smoker    Packs/day: 4.00    Years: 20.00    Pack years: 80.00    Quit date: 06/09/1985    Years since quitting: 35.2  . Smokeless tobacco: Former Engineer, water and Sexual Activity  . Alcohol use: No  . Drug use: No  . Sexual activity: Yes    Partners: Female    Birth control/protection: None  Other Topics Concern  . Not on file  Social History Narrative   Lives with his wife and their cat.   Social Determinants of Health   Financial Resource Strain: Not on file  Food Insecurity: Not on file  Transportation Needs: Not on file  Physical Activity: Not on file  Stress: Not on file  Social Connections: Not on file     Family History:  The patient's family history includes Diabetes in his mother; Hypertension in his brother, father, and mother; Stroke in his father.   Review of Systems:   Please see the history of present illness.    All other systems reviewed and are otherwise negative except as noted above.   Physical Exam:    VS:  BP 132/90   Pulse 79   Ht 6\' 2"  (1.88 m)   Wt 215 lb 9.6 oz (97.8 kg)   SpO2 98%   BMI 27.68 kg/m    GENERAL:  Well appearing WM in NAD HEENT:  PERRL, EOMI, sclera are clear. Oropharynx is clear. NECK:  No jugular venous distention, carotid upstroke brisk and symmetric, no bruits, no thyromegaly or adenopathy LUNGS:  Clear to auscultation bilaterally CHEST:  Unremarkable HEART:  IRRR,  PMI not displaced or sustained,S1 and S2 within normal  limits, no S3, no S4: no clicks, no rubs, no murmurs ABD:  Soft, nontender. BS +, no masses or bruits. No hepatomegaly, no splenomegaly EXT:  2 + pulses throughout, no edema, no cyanosis no clubbing SKIN:  Warm and dry.  No  rashes NEURO:  Alert and oriented x 3. Cranial nerves II through XII intact. PSYCH:  Cognitively intact    Wt Readings from Last 3 Encounters:  09/11/20 215 lb 9.6 oz (97.8 kg)  08/28/20 219 lb 12.8 oz (99.7 kg)  08/01/20 210 lb (95.3 kg)     Studies/Labs Reviewed:   Recent Labs: 07/26/2020: BUN 9; Creatinine, Ser 0.88; Hemoglobin 15.0; Platelets 211; Potassium 3.9; Sodium 140   Lipid Panel    Component Value Date/Time   CHOL 136 11/18/2016 1030   TRIG 217 (H) 11/18/2016 1030   HDL 39 (L) 11/18/2016 1030   CHOLHDL 3.5 11/18/2016 1030   LDLCALC 54 11/18/2016 1030   Dated 03/31/17: A1c 7.8%. Hgb and chemistries normal. Dated 09/29/17: glucose 117, otherwise CBC, CMET and PSA normal. Cholesterol 140, triglycerides 172, HDL 37, LDL 69. Dated 04/14/18: A1c 6.4%. cholesterol 140, Triglycerides 172, HDL 37, LDL 69. Glucose 131, creatinine 1.1. other chemistries, TSH, CBC normal Dated 10/06/18: cholesterol 142, triglycerides 105, HDL 39, LDL 82. A1c 6.6%. CMET and CBC normal.  Dated 10/21/19: cholesterol 116, triglycerides 158, HDL 42, LDL 42.  Dated 04/26/20: A1c 6.6%. CMET normal   Additional studies/ records that were reviewed today include:   Echocardiogram: 11/24/2016 Study Conclusions  - Left ventricle: The cavity size was mildly dilated. There was   mild focal basal hypertrophy of the septum. Systolic function was   normal. The estimated ejection fraction was 55%. Wall motion was   normal; there were no regional wall motion abnormalities. - Aortic valve: Trileaflet; mildly thickened, mildly calcified   leaflets. - Mitral valve: Calcified annulus. - Left atrium: The atrium was moderately dilated. - Right ventricle: The cavity size was mildly dilated.  Wall   thickness was normal. - Pulmonic valve: There was trivial regurgitation.  Echo 08/10/20: 1. Left ventricular ejection fraction, by estimation, is 60 to 65%. The  left ventricle has normal function. The left ventricle has no regional  wall motion abnormalities. Left ventricular diastolic parameters are  indeterminate.  2. Right ventricular systolic function is normal. The right ventricular  size is mildly enlarged. Tricuspid regurgitation signal is inadequate for  assessing PA pressure.  3. Left atrial size was moderately dilated.  4. Right atrial size was moderately dilated.  5. The mitral valve is normal in structure. Trivial mitral valve  regurgitation. No evidence of mitral stenosis.  6. The aortic valve is tricuspid. Aortic valve regurgitation is not  visualized. No aortic stenosis is present.  7. The inferior vena cava is normal in size with greater than 50%  respiratory variability, suggesting right atrial pressure of 3 mmHg.   Assessment:    1. Persistent atrial fibrillation (HCC)   2. HYPERCHOLESTEROLEMIA   3. Essential hypertension   4. Coronary artery disease involving native coronary artery of native heart without angina pectoris      Plan:    1. Paroxysmal Atrial Fibrillation/ Use of Long-term Anticoagulation - initially diagnosed with atrial fibrillation back in 11/2016 and is  s/p DCCV on 01/08/2017. - Recurrent Afib now s/p repeat DCCV in Feb. - early recurrence of Afib. - discussed options of AAD therapy, ablation, versus leaving in AFib. He is more inclined to have ablation done.  - plan for ablation with Dr Elberta Fortis - will continue Lopressor and Cardizem CD at current doses.  -  Continue Eliquis 5mg  BID for anticoagulation.   2. CAD - s/p remote stenting of RCA in 1998. - he is asymptomatic.  - continue BB and statin  therapy.   3. HTN - BP is  well controlled.  4. HLD - at goal on lipitor.  - continue Atorvastatin 80mg  daily.   5. OSA -  continue CPAP.  - encouraged with his weight loss.    Signed, Vennesa Bastedo , MD,FACC 09/11/2020 1:47 PM    [facility] [address] [place], [place] Phone: [phone]

## 2020-09-11 ENCOUNTER — Encounter: Payer: Self-pay | Admitting: Cardiology

## 2020-09-11 ENCOUNTER — Ambulatory Visit (INDEPENDENT_AMBULATORY_CARE_PROVIDER_SITE_OTHER): Payer: Medicare Other | Admitting: Cardiology

## 2020-09-11 ENCOUNTER — Other Ambulatory Visit: Payer: Self-pay

## 2020-09-11 VITALS — BP 132/90 | HR 79 | Ht 74.0 in | Wt 215.6 lb

## 2020-09-11 DIAGNOSIS — I10 Essential (primary) hypertension: Secondary | ICD-10-CM

## 2020-09-11 DIAGNOSIS — I2510 Atherosclerotic heart disease of native coronary artery without angina pectoris: Secondary | ICD-10-CM | POA: Diagnosis not present

## 2020-09-11 DIAGNOSIS — I4819 Other persistent atrial fibrillation: Secondary | ICD-10-CM | POA: Diagnosis not present

## 2020-09-11 DIAGNOSIS — E7800 Pure hypercholesterolemia, unspecified: Secondary | ICD-10-CM

## 2020-09-19 ENCOUNTER — Ambulatory Visit: Payer: Medicare Other

## 2020-09-19 ENCOUNTER — Ambulatory Visit (INDEPENDENT_AMBULATORY_CARE_PROVIDER_SITE_OTHER): Payer: Medicare Other | Admitting: Psychology

## 2020-09-19 ENCOUNTER — Encounter: Payer: Self-pay | Admitting: Psychology

## 2020-09-19 ENCOUNTER — Other Ambulatory Visit: Payer: Self-pay

## 2020-09-19 DIAGNOSIS — R4189 Other symptoms and signs involving cognitive functions and awareness: Secondary | ICD-10-CM

## 2020-09-19 DIAGNOSIS — G3184 Mild cognitive impairment, so stated: Secondary | ICD-10-CM | POA: Diagnosis not present

## 2020-09-19 DIAGNOSIS — I483 Typical atrial flutter: Secondary | ICD-10-CM

## 2020-09-19 DIAGNOSIS — I2510 Atherosclerotic heart disease of native coronary artery without angina pectoris: Secondary | ICD-10-CM | POA: Diagnosis not present

## 2020-09-19 DIAGNOSIS — K2100 Gastro-esophageal reflux disease with esophagitis, without bleeding: Secondary | ICD-10-CM | POA: Insufficient documentation

## 2020-09-19 DIAGNOSIS — I4892 Unspecified atrial flutter: Secondary | ICD-10-CM | POA: Insufficient documentation

## 2020-09-19 DIAGNOSIS — I480 Paroxysmal atrial fibrillation: Secondary | ICD-10-CM | POA: Insufficient documentation

## 2020-09-19 DIAGNOSIS — K900 Celiac disease: Secondary | ICD-10-CM | POA: Insufficient documentation

## 2020-09-19 HISTORY — DX: Mild cognitive impairment of uncertain or unknown etiology: G31.84

## 2020-09-19 HISTORY — DX: Gastro-esophageal reflux disease with esophagitis, without bleeding: K21.00

## 2020-09-19 NOTE — Progress Notes (Signed)
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        Scaled Score Percentile   WAIS-IV Similarities: 6 9 Below Average       D-KEFS Color-Word Interference Test: Raw Score (Scaled Score) Percentile     Color Naming 68 secs. (1) <1 Exceptionally Low    Word Reading 33 secs. (6) 9 Below Average    Inhibition 144 secs. (1) <1 Exceptionally Low      Total Errors 0 errors (12) 75 Above Average    Inhibition/Switching 180 secs. (1) <1 Exceptionally Low      Total Errors 13 errors (1) <1 Exceptionally Low       D-KEFS Verbal Fluency Test: Raw Score (Scaled Score) Percentile     Letter Total Correct 10 (2) <1 Exceptionally Low    Category Total Correct 12 (1) <1 Exceptionally Low    Category Switching Total Correct 4 (1) <1 Exceptionally Low    Category Switching Accuracy 2 (1) <1 Exceptionally Low      Total Set Loss Errors 3 (9) 37 Average      Total Repetition Errors 2 (11) 63 Average       NAB Executive Functions Module, Form 1: T Score Percentile     Judgment 48 42 Average       Language:          Verbal Fluency Test: Raw Score (T Score) Percentile     Phonemic Fluency (FAS) 10 (19) <1 Exceptionally Low    Animal Fluency 10 (24) <1 Exceptionally Low        NAB Language  Module, Form 1: T Score Percentile     Auditory Comprehension 36 8  Well Below Average    Naming 31/31 (55) 69 Average       Visuospatial/Visuoconstruction:      Raw Score Percentile   Clock Drawing: 8/10 --- Within Normal Limits        Scaled Score Percentile   WAIS-IV Block Design: 9 37 Average       Mood and Personality:      Raw Score Percentile   Geriatric Depression Scale: 0 --- Within Normal Limits  Geriatric Anxiety Scale: 16 --- Mild    Somatic 7 --- Mild    Cognitive 5 --- Mild    Affective 4 --- Mild       Additional Questionnaires:      Raw Score Percentile   PROMIS Sleep Disturbance Questionnaire: 23 --- None to Slight   Informed Consent and Coding/Compliance:   The current evaluation represents a clinical evaluation for the purposes previously outlined by the referral source and is in no way reflective of a forensic evaluation.   Mr. Cheree DittoGraham was provided with a verbal description of the nature and purpose of the present neuropsychological evaluation. Also reviewed were the foreseeable risks and/or discomforts and benefits of the procedure, limits of confidentiality, and mandatory reporting requirements of this provider. The patient was given the opportunity to ask questions and receive answers about the evaluation. Oral consent to participate was provided by the patient.   This evaluation was conducted by Newman NickelsZachary C. Derrian Rodak, Ph.D., licensed clinical neuropsychologist. Mr. Cheree DittoGraham completed a clinical interview with Dr. Milbert CoulterMerz, billed as one unit [phone], and 160 minutes of cognitive testing and scoring, billed as one unit [phone] and four additional units [id number]. Psychometrist Shan LevansKim Shuttleworth, B.S., assisted Dr. Milbert CoulterMerz with test administration and scoring procedures. As a separate and discrete service, Dr. Milbert CoulterMerz spent a total of 155 minutes in interpretation and report writing billed as one unit [phone] and two units 96133.

## 2020-09-19 NOTE — Progress Notes (Signed)
   Psychometrician Note   Cognitive testing was administered to [name] by Shan Levans, B.S. (psychometrist) under the supervision of Dr. Newman Nickels, Ph.D., licensed psychologist on 09/19/2020. Mr. Salvia did not appear overtly distressed by the testing session per behavioral observation or responses across self-report questionnaires. Rest breaks were offered.    The battery of tests administered was selected by Dr. Newman Nickels, Ph.D. with consideration to Mr. Peggs current level of functioning, the nature of his symptoms, emotional and behavioral responses during interview, level of literacy, observed level of motivation/effort, and the nature of the referral question. This battery was communicated to the psychometrist. Communication between Dr. Newman Nickels, Ph.D. and the psychometrist was ongoing throughout the evaluation and Dr. Newman Nickels, Ph.D. was immediately accessible at all times. Dr. Newman Nickels, Ph.D. provided supervision to the psychometrist on the date of this service to the extent necessary to assure the quality of all services provided.    HARMON BOMMARITO will return within approximately 1-2 weeks for an interactive feedback session with Dr. Milbert Coulter at which time his test performances, clinical impressions, and treatment recommendations will be reviewed in detail. Mr. Cominsky understands he can contact our office should he require our assistance before this time.  A total of 160 minutes of billable time were spent face-to-face with Mr. Hinze by the psychometrist. This includes both test administration and scoring time. Billing for these services is reflected in the clinical report generated by Dr. Newman Nickels, Ph.D.  This note reflects time spent with the psychometrician and does not include test scores or any clinical interpretations made by Dr. Milbert Coulter. The full report will follow in a separate note.

## 2020-09-25 ENCOUNTER — Other Ambulatory Visit: Payer: Self-pay

## 2020-09-25 ENCOUNTER — Ambulatory Visit (INDEPENDENT_AMBULATORY_CARE_PROVIDER_SITE_OTHER): Payer: Medicare Other | Admitting: Psychology

## 2020-09-25 DIAGNOSIS — G3184 Mild cognitive impairment, so stated: Secondary | ICD-10-CM

## 2020-09-25 NOTE — Progress Notes (Signed)
   Neuropsychology Feedback Session Michael Hartman. [facility] [place] Department of Neurology  Reason for Referral:   Michael Hartman a 65 y.o. right-handed Caucasian male referred by Huston Foley, M.D.,to characterize hiscurrent cognitive functioning and assist with diagnostic clarity and treatment planning in the context of subjective cognitive decline.   Feedback:   [name] completed a comprehensive neuropsychological evaluation on 09/19/2020. Please refer to that encounter for the full report and recommendations. Briefly, results suggested some validity concerns surrounding test results, meaning that the current evaluation should be interpreted with caution as lower performances have the potential to underestimate true cognitive abilities. If taken at face value, [name] pattern of performance is suggestive of prominent impairment surrounding all aspects of learning and memory. Impairments were also exhibited across executive functioning and verbal fluency, while more relative weaknesses were exhibited across working memory and receptive language. While validity concerns make understanding the true extent of cognitive impairment unable to be determined, I do believe that Mr. Boy exhibits appreciable cognitive impairment in several domains. I cannot rule out the presence of an underlying neurodegenerative disease at the present time. In fact, if test performances were taken at face value, [name] amnestic memory profile with evidence for memory storage impairment would certainly raise concerns for the presence of Alzheimer's disease.  [name] was accompanied by his wife during the current feedback session. Content of the current session focused on the results of his neuropsychological evaluation. [name] and his wife were given the opportunity to ask questions and their questions were answered. They were encouraged to reach out should additional questions arise. A copy  of his report was provided at the conclusion of the visit.      25 minutes were spent conducting the current feedback session with [name], billed as one unit [phone].

## 2020-10-04 ENCOUNTER — Other Ambulatory Visit: Payer: Self-pay

## 2020-10-04 ENCOUNTER — Other Ambulatory Visit: Payer: Medicare Other

## 2020-10-04 DIAGNOSIS — Z01812 Encounter for preprocedural laboratory examination: Secondary | ICD-10-CM

## 2020-10-04 DIAGNOSIS — I4819 Other persistent atrial fibrillation: Secondary | ICD-10-CM

## 2020-10-04 LAB — BASIC METABOLIC PANEL
BUN/Creatinine Ratio: 17 (ref 10–24)
BUN: 16 mg/dL (ref 8–27)
CO2: 25 mmol/L (ref 20–29)
Calcium: 9.8 mg/dL (ref 8.6–10.2)
Chloride: 101 mmol/L (ref 96–106)
Creatinine, Ser: 0.92 mg/dL (ref 0.76–1.27)
Glucose: 137 mg/dL — ABNORMAL HIGH (ref 65–99)
Potassium: 4.8 mmol/L (ref 3.5–5.2)
Sodium: 142 mmol/L (ref 134–144)
eGFR: 91 mL/min/{1.73_m2} (ref >59)

## 2020-10-04 LAB — CBC
Hematocrit: 45.2 % (ref 37.5–51.0)
Hemoglobin: 15.1 g/dL (ref 13.0–17.7)
MCH: 29.9 pg (ref 26.6–33.0)
MCHC: 33.4 g/dL (ref 31.5–35.7)
MCV: 90 fL (ref 79–97)
Platelets: 350 10*3/uL (ref 150–450)
RBC: 5.05 x10E6/uL (ref 4.14–5.80)
RDW: 12.9 % (ref 11.6–15.4)
WBC: 5.9 10*3/uL (ref 3.4–10.8)

## 2020-10-15 ENCOUNTER — Telehealth (HOSPITAL_COMMUNITY): Payer: Self-pay | Admitting: Emergency Medicine

## 2020-10-15 NOTE — Telephone Encounter (Signed)
Reaching out to patient to offer assistance regarding upcoming cardiac imaging study; pt verbalizes understanding of appt date/time, parking situation and where to check in, pre-test NPO status and medications ordered, and verified current allergies; name and call back number provided for further questions should they arise Aisia Correira RN Navigator Cardiac Imaging  [facility] [phone] office [phone] cell  100mg metoprolol tart 2 hr prior to scan Dax Murguia  

## 2020-10-16 ENCOUNTER — Encounter: Payer: Self-pay | Admitting: Neurology

## 2020-10-16 ENCOUNTER — Ambulatory Visit (INDEPENDENT_AMBULATORY_CARE_PROVIDER_SITE_OTHER): Payer: Medicare Other | Admitting: Neurology

## 2020-10-16 ENCOUNTER — Telehealth: Payer: Self-pay | Admitting: Neurology

## 2020-10-16 VITALS — BP 125/84 | HR 80 | Ht 74.0 in | Wt 219.5 lb

## 2020-10-16 DIAGNOSIS — I4892 Unspecified atrial flutter: Secondary | ICD-10-CM

## 2020-10-16 DIAGNOSIS — G3184 Mild cognitive impairment, so stated: Secondary | ICD-10-CM | POA: Diagnosis not present

## 2020-10-16 NOTE — Telephone Encounter (Signed)
[employer] medicare order sent to GI. No auth they will reach out to the patient to schedule.  

## 2020-10-16 NOTE — Progress Notes (Signed)
Subjective:    Patient ID: Michael Hartman is a 65 y.o. male.  HPI     Interim history:   Michael Hartman is a 64 year old right-handed gentleman with an underlying complex medical history of coronary artery disease with status post stenting, paroxysmal A. fib with status post cardioversion in 2018, chronic rhinitis, reflux disease, diabetes, migraine headaches, sleep apnea, on CPAP therapy, hyperlipidemia, hypertension, allergies, arthritis, low back pain, on chronic narcotic pain medication, and overweight state, who Presents for follow-up consultation of his memory loss, after interim neuropsychological evaluation.  The patient is accompanied by his wife today.  I first met him at the request of his primary care physician on 07/16/2020, at which time his wife reported concern about his short-term memory for the past 2 to 3 years.  He was repeating himself, forgetting conversations and asking the same question over again.  He had blood work through his primary care physician.  His MMSE during her first office visit was 26 out of 30.  He was advised to proceed with a brain MRI and he was advised to proceed with neuropsychological evaluation.  He was found to have an irregular heartbeat.  I suggested sooner than scheduled follow-up with his cardiologist.  He saw Dr. Melvyn Novas on 09/19/2020 and had a subsequent follow-up appointment for discussion on 09/25/2020.  I reviewed the impression and recommendations:   <<Clinical Impression(s): Scores across stand-alone and embedded performance validity measures were largely below expectation. Additionally, test engagement was poor at times, requiring encouragement from the psychometrist. Michael Hartman commonly stated "I'm blank" or "I'm shutting down and I know it" during testing, often before his participation in a task was discontinued. This certainly could represent increased anxiety and/or frustration surrounding being faced with poor performance across testing which could  impact his ability to fully engage. He also exhibited some odd performances, including makes errors when naming colors or reading basic words such as "red" or "blue," which is quite uncommon even in individuals with significant cognitive dysfunction. Overall, as there are some validity concerns surrounding test results, the current evaluation should be interpreted with caution as lower performances have the potential to underestimate true cognitive abilities.   If taken at face value, Michael Hartman pattern of performance is suggestive of prominent impairment surrounding all aspects of learning and memory. Impairments were also exhibited across executive functioning and verbal fluency, while more relative weaknesses were exhibited across working memory and receptive language. Processing speed was variable while performance was appropriate across basic attention, confrontation naming, and visuospatial abilities. Michael Hartman denied difficulties completing instrumental activities of daily living (ADLs) independently; this was independently confirmed by his wife.    While validity concerns make understanding the true extent of cognitive impairment unable to be determined, I do believe that Michael Hartman exhibits appreciable cognitive impairment in several domains. This, coupled with reportedly intact ADLs, would suggest he meets diagnostic criteria for a Mild Neurocognitive Disorder ("mild cognitive impairment") at the present time. There is a high likelihood of a significant vascular component to his current presentation given numerous vascular-related medical conditions, especially atrial fibrillation. Additionally, medication side effects, especially muscle relaxants and those related to pain management, could certainly worsen vascular-related deficits. Given test validity concerns, I cannot rule out the presence of an underlying neurodegenerative disease at the present time. In fact, if test performances were taken at  face value, Michael Hartman amnestic memory profile with evidence for memory storage impairment would certainly raise concerns for the presence of Alzheimer's  disease. However, future repeat testing will be necessary to obtain more firm diagnostic conclusions. Continued medical monitoring will be important moving forward.   Recommendations: A repeat neuropsychological evaluation in 18-24 months (or sooner if functional decline is noted) is recommended to assess the trajectory of future cognitive decline should it occur. This will also aid in future efforts towards improved diagnostic clarity.   Neuroimaging would be beneficial. His wife noted that he attempted to complete a brain MRI but experienced symptoms of claustrophobia and ultimately could not go through with the scan. If he is up for it, he could attempt to undergo a brain MRI while sedated. If not, even the completion of a head CT would be beneficial.    Appropriate management of cardiovascular conditions, especially atrial fibrillation, will be important in slowing future cognitive decline and lessening his future stroke risk. Ensuring that he continues to use his CPAP machine nightly will also be very important.    I would also recommend that he organize his medications with a pillbox or some other system. Should medications not be taken accurately, this can increase negative medical and cognitive outcomes. I would encourage him to allow his wife to periodically check to ensure that medications are being taken accurately.    Michael Hartman is encouraged to attend to lifestyle factors for brain health (e.g., regular physical exercise, good nutrition habits, regular participation in cognitively-stimulating activities, and general stress management techniques), which are likely to have benefits for both emotional adjustment and cognition. Continued participation in activities which provide mental stimulation and social interaction is also recommended.     When learning new information, he would benefit from information being broken up into small, manageable pieces. He may also find it helpful to articulate the material in his own words and in a context to promote encoding at the onset of a new task. This material may need to be repeated multiple times to promote encoding.   Memory can be improved using internal strategies such as rehearsal, repetition, chunking, mnemonics, association, and imagery. External strategies such as written notes in a consistently used memory journal, visual and nonverbal auditory cues such as a calendar on the refrigerator or appointments with alarm, such as on a cell phone, can also help maximize recall.     To address problems with processing speed, he may wish to consider:   -Ensuring that he is alerted when essential material or instructions are being presented   -Allowing additional processing time or a chance to rehearse novel information   -Allowing for more time in comprehending, processing, and responding in conversation   To address problems with fluctuating attention and executive dysfunction, he may wish to consider:   -Avoiding external distractions when needing to concentrate   -Limiting exposure to fast paced environments with multiple sensory demands   -Writing down complicated information and using checklists   -Attempting and completing one task at a time (i.e., no multi-tasking)   -Verbalizing aloud each step of a task to maintain focus   -Reducing the amount of information considered at one time  >>.    His wife called on 07/24/2020 reporting that he was unable to go through with the MRI.   Today, 10/16/2020: He reports feeling about the same, wife reports ongoing issues with short-term memory such as forgetting within minutes what he had said or what he had asked.  He has not had any recent falls.  He does not exercise very much, he reports hydrating well with water,  drinking about 3 L of water per  day.  Wife reports that he also likes to drink diet soda but he reports that he has cut back.  He is scheduled for a cardiac ablation on 10/24/2020 under Dr. Curt Bears.  He has a cardiac CT pending.  Wife is wondering if we could do a brain MRI under anesthesia.  He could not tolerate the MRI under anxiolytic medication.  He does report some symptoms of stress and anxiety.  He reports stressors affecting the family in general.  Wife also has her own health issues to deal with.  She reports having a lot of stress in general.  He was found to be in atrial flutter.  He had a cardioversion on 08/01/2020 but did not stay in sinus rhythm and was found to be in A. fib.  He is saw cardiology on 08/28/2020 and is now scheduled to have an ablation.  He reports compliance with his CPAP, he has an appointment pending with Dr. Maxwell Caul in January 2023.  The patient's allergies, current medications, family history, past medical history, past social history, past surgical history and problem list were reviewed and updated as appropriate.   Previously:   07/16/20: (He) reports very little about his symptoms, his referral was primarily requested by his wife.  She provides most of his history.  She reports concern about his short-term memory for the past 2 to 3 years.  He has a tendency to be forgetful, including forgetting conversations or events.  He is often repeating himself, including repeating questions that were recently discussed.  He has done most of the housework lately because she had open heart surgery.  He drives without difficulty and typically has had no difficulty driving.  He has been compliant with his CPAP and is followed by Dr. Maxwell Caul, has an appointment coming up soon.  He reports some forgetfulness.  His wife endorses that patient's mom had a diagnosis of Alzheimer's dementia, she lived to be into her 32s.  Patient is followed by Dr. Greta Doom for pain management.  He has been on Percocet and morphine for at least  5 years.  He takes Percocet up to 3 times a day and takes morphine twice daily.  He also takes a muscle relaxer, 1 or 2 pills up to 3 times a day, Robaxin.  He quit smoking over 40 years ago.  He has no biological children but has stepchildren.  He has been married to his wife for 34 years.  He drinks quite a bit of water, estimates that he drinks about 2 to 3 L/day, no alcohol on a regular basis and drinks caffeine in the form of diet Pepsi, about 2/day on average.  He is retired.  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  He does report having sustained head injuries in the past. I reviewed your office note from 04/25/2020.  Had a CMP at the time which showed glucose of 135, creatinine 1.2, BUN 15, A1c 6.6.  He also had additional labs including TSH, RPR and vitamin B12, we will request those test results from your office as well.   He denies any sudden onset of one-sided weakness or numbness or tingling or droopy face or slurring of speech.  Upon further asking if he has been in A. fib.  He reports that at his follow-up appointment with Dr. Greta Doom he was told that he may be in A. fib.  He  has not had any new symptoms such as shortness of breath, palpitations, chest pain.  His Past Medical History Is Significant For: Past Medical History:  Diagnosis Date  . Arthritis   . Atrial fibrillation   . Atrial flutter    a. diagnosed in 11/2016 --> started on Eliquis for anticoagulation b. s/p DCCV in 01/2017  . CAD (coronary artery disease) 08/02/2007  . Celiac disease   . Chronic rhinitis 08/02/2007  . Coronary artery disease    a. s/p remote stenting of RCA in 1998  . Diabetes mellitus type 2 03/23/2012  . Essential hypertension 08/02/2007  . Gastro-esophageal reflux disease with esophagitis 09/19/2020  . History of migraine headaches   . MCI (mild cognitive impairment) 09/19/2020  . OSA (obstructive sleep apnea)    uses CPAP regularly  . Pure  hypercholesterolemia 08/02/2007  . Secondary hypercoagulable state 07/26/2020    His Past Surgical History Is Significant For: Past Surgical History:  Procedure Laterality Date  . ANKLE ARTHROPLASTY    . CARDIAC CATHETERIZATION  06/06/1997   EF 65%  . CARDIOVASCULAR STRESS TEST  12/07/2007   EF 58%  . CARDIOVERSION N/A 01/08/2017   Procedure: CARDIOVERSION;  Surgeon: Josue Hector, MD;  [facility] END[facility]COPY;  Service: Cardiovascular;  Laterality: N/A;  . CARDIOVERSION N/A 08/01/2020   Procedure: CARDIOVERSION;  Surgeon: Elouise Munroe, MD;  Location: [facility] ENDOSCOPY;  Service: Cardiovascular;  Laterality: N/A;  . CORONARY STENT [place]    . EYE SURGERY    . KNEE ARTHROPLASTY    . NASAL SINUS SURGERY    . SHOULDER ACROMIOPLASTY    . SPINE SURGERY      His Family History Is Significant For: Family History  Problem Relation Age of Onset  . Hypertension Mother   . Diabetes Mother   . Alzheimer's disease Mother   . Stroke Father   . Hypertension Father   . Hypertension Brother     His Social History Is Significant For: Social History   Socioeconomic History  . Marital status: Married    Spouse name: [name]  . Number of children: 0  . Years of education: 16  . Highest education level: Bachelor's degree (e.g., BA, AB, BS)  Occupational History  . Occupation: Retired    Fish farm manager: [employer]    Comment: DETENTION OFFICER  Tobacco Use  . Smoking status: Former Smoker    Packs/day: 4.00    Years: 20.00    Pack years: 80.00    Quit date: 06/09/1985    Years since quitting: 35.3  . Smokeless tobacco: Former Network engineer and Sexual Activity  . Alcohol use: No  . Drug use: Not Currently  . Sexual activity: Yes    Partners: Female    Birth control/protection: None  Other Topics Concern  . Not on file  Social History Narrative   Lives with his wife and their cat.   Social Determinants of Health   Financial Resource Strain: Not  on file  Food Insecurity: Not on file  Transportation Needs: Not on file  Physical Activity: Not on file  Stress: Not on file  Social Connections: Not on file    His Allergies Are:  Allergies  Allergen Reactions  . Ambien [Zolpidem Tartrate]     Amnesia, odd behavior  . Amoxicillin Rash  . Penicillins Rash    Child hood  :   His Current Medications Are:  Outpatient Encounter Medications as of 10/16/2020  Medication  Sig  . acetaminophen (TYLENOL) 500 MG tablet Take 1,000 mg by mouth every 6 (six) hours as needed for mild pain or headache.  Marland Kitchen atorvastatin (LIPITOR) 80 MG tablet TAKE 1 TABLET BY MOUTH  DAILY  . CARTIA XT 180 MG 24 hr capsule TAKE 1 CAPSULE BY MOUTH  DAILY  . docusate sodium (COLACE) 100 MG capsule Take 100 mg by mouth daily.  Marland Kitchen ELIQUIS 5 MG TABS tablet TAKE 1 TABLET BY MOUTH  TWICE DAILY  . FARXIGA 10 MG TABS tablet Take 10 mg by mouth daily.  . fluticasone (FLONASE) 50 MCG/ACT nasal spray Place 1 spray into both nostrils daily as needed for rhinitis.  . metFORMIN (GLUCOPHAGE) 500 MG tablet Take 500 mg by mouth 2 (two) times daily.  . methocarbamol (ROBAXIN) 500 MG tablet Take 500 mg by mouth 2 (two) times daily as needed for muscle spasms.  . metoprolol tartrate (LOPRESSOR) 50 MG tablet TAKE 1 TABLET BY MOUTH  TWICE DAILY  . morphine (KADIAN) 50 MG 24 hr capsule Take 30 mg by mouth every 12 (twelve) hours.  . Multiple Vitamin (MULTIVITAMIN) capsule Take 1 capsule by mouth daily.  . nitroGLYCERIN (NITROSTAT) 0.4 MG SL tablet Place 1 tablet (0.4 mg total) under the tongue every 5 (five) minutes as needed for chest pain.  . ONE TOUCH ULTRA TEST test strip 1 each by Other route as needed. Use 1 strip to check glucose daily  . oxyCODONE-acetaminophen (PERCOCET) 10-325 MG per tablet Take 1 tablet by mouth 3 (three) times daily as needed for pain.  . TRADJENTA 5 MG TABS tablet 5 mg daily.    No facility-administered encounter medications on file as of 10/16/2020.   :  Review of Systems:  Out of a complete 14 point review of systems, all are reviewed and negative with the exception of these symptoms as listed below: Review of Systems  Neurological:       Here for 3 month f/u. Pt reports he is doing ok. Neuro psych testing has been completed.     Objective:  Neurological Exam  Physical Exam Physical Examination:   Vitals:   10/16/20 1041  BP: 125/84  Pulse: 80    General Examination: The patient is a very pleasant 65 y.o. male in no acute distress. He appears well-developed and well-nourished and well groomed.   HEENT: Normocephalic, atraumatic, pupils are equal, round and reactive to light, extraocular tracking is well preserved, hearing appears to be mildly impaired, face is symmetric with normal facial animation, speech without dysarthria, hypophonia or voice tremor.  Neck is supple with good range of motion, no carotid bruits.  Airway examination reveals mild mouth dryness, moderate airway crowding, tongue protrudes centrally and palate elevates symmetrically.    Chest: Clear to auscultation without wheezing, rhonchi or crackles noted.  Heart: S1+S2+0, irregularly irregular, with no murmurs, rubs or gallops noted.   Abdomen: Soft, non-tender and non-distended with normal bowel sounds appreciated on auscultation.  Extremities: There is no pitting edema in the distal lower extremities bilaterally.   Skin: Warm and dry without trophic changes noted.  Musculoskeletal: exam reveals no obvious joint deformities, tenderness or joint swelling or erythema.   Neurologically:  Mental status: The patient is awake, alert and oriented in all 4 spheres. His immediate and remote memory, attention, language skills and fund of knowledge are mildly impaired.  He is not providing details to his history and looks to his wife for responses often.  His wife reports most of the details  of his history including family history of dementia affecting his  mother.    On 07/16/2020: MMSE: 26/30 (He lost 3 points on orientation to time, 1 point on orientation to place), CDT: 4/4, AFT: 11/min.  There is no evidence of aphasia, agnosia, apraxia or anomia. Speech is scant without dysarthria or hypophonia.  Thought process is linear. Mood is normal and affect is normal.  Cranial nerves II - XII are as described above under HEENT exam.  Motor exam: Normal bulk, strength and tone is noted. There is no tremor.  Fine motor skills are grossly intact in the upper and lower extremities.  Cerebellar testing: No dysmetria or intention tremor on finger to nose testing. Heel to shin is unremarkable bilaterally. There is no truncal or gait ataxia.  Sensory exam: intact to light touch in the upper and lower extremities.  Gait, station and balance: He stands with mild difficulty, posture shows mild increase in lumbar kyphosis.  He walks slightly slowly and cautiously, preserved arm swing noted, no shuffling.  Assessment and Plan:   In summary, Michael Hartman is a very pleasant 65 year old male with an underlying medical history of coronary artery disease with status post stenting, paroxysmal A. fib with status post cardioversion in 2018, atrial flutter with status post cardioversion on 08/01/2020, now pending cardiac ablation on 10/24/2020, chronic rhinitis, reflux disease, diabetes, migraine headaches, sleep apnea, on CPAP therapy (followed by Dr. Maxwell Caul), hyperlipidemia, hypertension, allergies, arthritis, low back pain, on chronic narcotic pain medication, and overweight state, who presents for follow-up consultation of his memory loss of about 2 to 3 years duration.  He was not able to go through with a brain MRI.  He is agreeable to proceeding with a CT head without contrast.  He has a cardiac CT pending for tomorrow and we will schedule his head CT accordingly to when he is able to, his cardiac ablation is also pending for next week.  We talked about the importance  of healthy lifestyle, he does have vascular risk factors.  He had neuropsychological evaluation with variable effort and results noted.  Current working diagnosis is mild cognitive impairment.  He is on multiple medications.  He is advised to talk to his primary care physician about symptoms of anxiety and stress, also depressive symptoms.  These may very well be situational but may be worth addressing.  Medication side effects should also be considered as he is on narcotic pain medications as well as muscle relaxer.  At this juncture, I did not suggest any dementia medicine.  We will continue to monitor his symptoms and exam.  We will call him with his head CT results and plan a follow-up in this office for him to see one of our nurse practitioners in 6 months. I answered all their questions today and the patient and his wife were in agreement.  This was an extended visit with multiple recent records reviewed. I spent 40 minutes in total face-to-face time and in reviewing records during pre-charting, more than 50% of which was spent in counseling and coordination of care, reviewing test results, reviewing medications and treatment regimen and/or in discussing or reviewing the diagnosis of memory loss, the prognosis and treatment options. Pertinent laboratory and imaging test results that were available during this visit with the patient were reviewed by me and considered in my medical decision making (see chart for details).

## 2020-10-16 NOTE — Patient Instructions (Signed)
It was good to see you again today.  We will continue to monitor your memory.  For symptoms of stress and anxiety and/or depressive symptoms, I recommend follow-up with your primary care physician.  Please also keep your appointment with the sleep specialist next year and cardiologist as scheduled.  As discussed, since you were not able to go through with a brain MRI, I will order a head CT without contrast, we will schedule at your convenience and call you with the results.  Please follow-up routinely to see one of our nurse practitioners in 6 months, sooner if needed.  Please continue to hydrate well, avoid caffeine, avoid alcohol, and try to exercise in moderation, also as per your cardiologist's recommendation.  Please continue to be fully compliant with your CPAP.

## 2020-10-17 ENCOUNTER — Ambulatory Visit (HOSPITAL_COMMUNITY)
Admission: RE | Admit: 2020-10-17 | Discharge: 2020-10-17 | Disposition: A | Discharge status: Home / Self Care | Payer: Medicare Other | Source: Ambulatory Visit | Attending: Cardiology | Admitting: Cardiology

## 2020-10-17 ENCOUNTER — Telehealth (HOSPITAL_COMMUNITY): Payer: Self-pay | Admitting: Emergency Medicine

## 2020-10-17 ENCOUNTER — Other Ambulatory Visit: Payer: Self-pay

## 2020-10-17 DIAGNOSIS — I4819 Other persistent atrial fibrillation: Secondary | ICD-10-CM | POA: Diagnosis not present

## 2020-10-17 MED ORDER — METOPROLOL TARTRATE 5 MG/5ML IV SOLN
INTRAVENOUS | Status: AC
  Administered 2020-10-17: 10 mg via INTRAVENOUS
  Filled 2020-10-17: qty 20

## 2020-10-17 MED ORDER — IOHEXOL 350 MG/ML SOLN
50.0000 mL | Freq: Once | INTRAVENOUS | Status: AC | PRN
  Administered 2020-10-17: 80 mL via INTRAVENOUS

## 2020-10-17 MED ORDER — METOPROLOL TARTRATE 5 MG/5ML IV SOLN: 10.0000 mg | INTRAVENOUS | Status: DC | PRN

## 2020-10-17 NOTE — Telephone Encounter (Signed)
Pt calling to clarify medications for todays test.  Pt to take 100mg  metoprolol tartrate 2h pta   RN Navigator Cardiac Imaging [facility] [phone] Office  [phone] Cell

## 2020-10-22 ENCOUNTER — Other Ambulatory Visit (HOSPITAL_COMMUNITY)
Admission: RE | Admit: 2020-10-22 | Discharge: 2020-10-22 | Disposition: A | Discharge status: Home / Self Care | Payer: Medicare Other | Source: Ambulatory Visit | Attending: Cardiology | Admitting: Cardiology

## 2020-10-22 DIAGNOSIS — Z20822 Contact with and (suspected) exposure to covid-19: Secondary | ICD-10-CM | POA: Insufficient documentation

## 2020-10-22 DIAGNOSIS — Z01812 Encounter for preprocedural laboratory examination: Secondary | ICD-10-CM | POA: Insufficient documentation

## 2020-10-23 LAB — SARS CORONAVIRUS 2 (TAT 6-24 HRS): SARS Coronavirus 2: NEGATIVE

## 2020-10-23 NOTE — Pre-Procedure Instructions (Signed)
Attempted to call patient regarding procedure instructions for tomorrow.  Left voicemail on the following items: Arrival time 0630 Nothing to eat or drink after midnight No meds AM of procedure Responsible person to drive you home and stay with you for 24 hrs  Have you missed any doses of anti-coagulant Eliquis, take both doses today, don't take any in the morning

## 2020-10-24 ENCOUNTER — Ambulatory Visit (HOSPITAL_COMMUNITY): Payer: Medicare Other | Admitting: Registered Nurse

## 2020-10-24 ENCOUNTER — Other Ambulatory Visit: Payer: Self-pay

## 2020-10-24 ENCOUNTER — Ambulatory Visit (HOSPITAL_COMMUNITY)
Admission: RE | Admit: 2020-10-24 | Discharge: 2020-10-24 | Disposition: A | Discharge status: Home / Self Care | Payer: Medicare Other | Attending: Cardiology | Admitting: Cardiology

## 2020-10-24 ENCOUNTER — Encounter (HOSPITAL_COMMUNITY)
Admission: RE | Disposition: A | Discharge status: Home / Self Care | Payer: Self-pay | Source: Home / Self Care | Attending: Cardiology

## 2020-10-24 DIAGNOSIS — Z87891 Personal history of nicotine dependence: Secondary | ICD-10-CM | POA: Diagnosis not present

## 2020-10-24 DIAGNOSIS — I4819 Other persistent atrial fibrillation: Secondary | ICD-10-CM | POA: Insufficient documentation

## 2020-10-24 DIAGNOSIS — Z888 Allergy status to other drugs, medicaments and biological substances status: Secondary | ICD-10-CM | POA: Insufficient documentation

## 2020-10-24 DIAGNOSIS — Z880 Allergy status to penicillin: Secondary | ICD-10-CM | POA: Insufficient documentation

## 2020-10-24 DIAGNOSIS — I10 Essential (primary) hypertension: Secondary | ICD-10-CM | POA: Insufficient documentation

## 2020-10-24 DIAGNOSIS — I2510 Atherosclerotic heart disease of native coronary artery without angina pectoris: Secondary | ICD-10-CM | POA: Insufficient documentation

## 2020-10-24 DIAGNOSIS — G4730 Sleep apnea, unspecified: Secondary | ICD-10-CM | POA: Insufficient documentation

## 2020-10-24 DIAGNOSIS — E785 Hyperlipidemia, unspecified: Secondary | ICD-10-CM | POA: Insufficient documentation

## 2020-10-24 DIAGNOSIS — Z7901 Long term (current) use of anticoagulants: Secondary | ICD-10-CM | POA: Insufficient documentation

## 2020-10-24 DIAGNOSIS — I4892 Unspecified atrial flutter: Secondary | ICD-10-CM | POA: Diagnosis not present

## 2020-10-24 HISTORY — PX: ATRIAL FIBRILLATION ABLATION: EP1191

## 2020-10-24 LAB — POCT ACTIVATED CLOTTING TIME
Activated Clotting Time: 285 seconds
Activated Clotting Time: 303 seconds
Activated Clotting Time: 327 seconds

## 2020-10-24 LAB — GLUCOSE, CAPILLARY
Glucose-Capillary: 137 mg/dL — ABNORMAL HIGH (ref 70–99)
Glucose-Capillary: 95 mg/dL (ref 70–99)

## 2020-10-24 SURGERY — ATRIAL FIBRILLATION ABLATION
Anesthesia: General

## 2020-10-24 MED ORDER — ONDANSETRON HCL 4 MG/2ML IJ SOLN: 4.0000 mg | Freq: Four times a day (QID) | INTRAMUSCULAR | Status: DC | PRN

## 2020-10-24 MED ORDER — PROPOFOL 10 MG/ML IV BOLUS
INTRAVENOUS | Status: DC | PRN
  Administered 2020-10-24: 40 mg via INTRAVENOUS
  Administered 2020-10-24: 160 mg via INTRAVENOUS

## 2020-10-24 MED ORDER — ACETAMINOPHEN 325 MG PO TABS: 650.0000 mg | ORAL_TABLET | ORAL | Status: DC | PRN

## 2020-10-24 MED ORDER — SODIUM CHLORIDE 0.9 % IV SOLN: 250.0000 mL | INTRAVENOUS | Status: DC | PRN

## 2020-10-24 MED ORDER — MIDAZOLAM HCL 5 MG/5ML IJ SOLN
INTRAMUSCULAR | Status: DC | PRN
  Administered 2020-10-24: 2 mg via INTRAVENOUS

## 2020-10-24 MED ORDER — APIXABAN 5 MG PO TABS
5.0000 mg | ORAL_TABLET | ORAL | Status: DC
  Filled 2020-10-24: qty 1

## 2020-10-24 MED ORDER — SODIUM CHLORIDE 0.9% FLUSH: 3.0000 mL | INTRAVENOUS | Status: DC | PRN

## 2020-10-24 MED ORDER — HEPARIN (PORCINE) IN NACL 1000-0.9 UT/500ML-% IV SOLN
INTRAVENOUS | Status: DC | PRN
  Administered 2020-10-24 (×5): 500 mL

## 2020-10-24 MED ORDER — DOBUTAMINE IN D5W 4-5 MG/ML-% IV SOLN
INTRAVENOUS | Status: DC | PRN
  Administered 2020-10-24: 20 ug/kg/min via INTRAVENOUS

## 2020-10-24 MED ORDER — LIDOCAINE 2% (20 MG/ML) 5 ML SYRINGE
INTRAMUSCULAR | Status: DC | PRN
  Administered 2020-10-24: 100 mg via INTRAVENOUS

## 2020-10-24 MED ORDER — SODIUM CHLORIDE 0.9 % IV SOLN: INTRAVENOUS | Status: DC

## 2020-10-24 MED ORDER — SUGAMMADEX SODIUM 200 MG/2ML IV SOLN
INTRAVENOUS | Status: DC | PRN
  Administered 2020-10-24: 200 mg via INTRAVENOUS

## 2020-10-24 MED ORDER — FENTANYL CITRATE (PF) 250 MCG/5ML IJ SOLN
INTRAMUSCULAR | Status: DC | PRN
  Administered 2020-10-24: 100 ug via INTRAVENOUS

## 2020-10-24 MED ORDER — DOBUTAMINE IN D5W 4-5 MG/ML-% IV SOLN
INTRAVENOUS | Status: AC | PRN
  Administered 2020-10-24: 20 ug/kg/min via INTRAVENOUS

## 2020-10-24 MED ORDER — ONDANSETRON HCL 4 MG/2ML IJ SOLN
INTRAMUSCULAR | Status: DC | PRN
  Administered 2020-10-24: 4 mg via INTRAVENOUS

## 2020-10-24 MED ORDER — PHENYLEPHRINE 40 MCG/ML (10ML) SYRINGE FOR IV PUSH (FOR BLOOD PRESSURE SUPPORT)
PREFILLED_SYRINGE | INTRAVENOUS | Status: DC | PRN
  Administered 2020-10-24: 40 ug via INTRAVENOUS

## 2020-10-24 MED ORDER — HEPARIN SODIUM (PORCINE) 1000 UNIT/ML IJ SOLN
INTRAMUSCULAR | Status: DC | PRN
  Administered 2020-10-24: 1000 [IU] via INTRAVENOUS

## 2020-10-24 MED ORDER — DEXAMETHASONE SODIUM PHOSPHATE 10 MG/ML IJ SOLN
INTRAMUSCULAR | Status: DC | PRN
  Administered 2020-10-24: 5 mg via INTRAVENOUS

## 2020-10-24 MED ORDER — ROCURONIUM BROMIDE 10 MG/ML (PF) SYRINGE
PREFILLED_SYRINGE | INTRAVENOUS | Status: DC | PRN
  Administered 2020-10-24: 60 mg via INTRAVENOUS

## 2020-10-24 MED ORDER — HEPARIN SODIUM (PORCINE) 1000 UNIT/ML IJ SOLN
INTRAMUSCULAR | Status: DC | PRN
  Administered 2020-10-24: 3000 [IU] via INTRAVENOUS
  Administered 2020-10-24: 4000 [IU] via INTRAVENOUS
  Administered 2020-10-24: 15000 [IU] via INTRAVENOUS

## 2020-10-24 MED ORDER — HEPARIN (PORCINE) IN NACL 1000-0.9 UT/500ML-% IV SOLN
INTRAVENOUS | Status: AC
  Filled 2020-10-24: qty 2500

## 2020-10-24 MED ORDER — PHENYLEPHRINE HCL-NACL 10-0.9 MG/250ML-% IV SOLN
INTRAVENOUS | Status: DC | PRN
  Administered 2020-10-24: 20 ug/min via INTRAVENOUS

## 2020-10-24 MED ORDER — SODIUM CHLORIDE 0.9% FLUSH: 3.0000 mL | Freq: Two times a day (BID) | INTRAVENOUS | Status: DC

## 2020-10-24 MED ORDER — HEPARIN SODIUM (PORCINE) 1000 UNIT/ML IJ SOLN
INTRAMUSCULAR | Status: AC
  Filled 2020-10-24: qty 1

## 2020-10-24 SURGICAL SUPPLY — 22 items
BAG SNAP BAND KOVER 36X36 (MISCELLANEOUS) ×2 IMPLANT
BLANKET WARM UNDERBOD FULL ACC (MISCELLANEOUS) ×2 IMPLANT
CATH MAPPNG PENTARAY F 2-6-2MM (CATHETERS) IMPLANT
CATH S CIRCA THERM PROBE 10F (CATHETERS) ×1 IMPLANT
CATH SMTCH THERMOCOOL SF DF (CATHETERS) ×1 IMPLANT
CATH SOUNDSTAR ECO 8FR (CATHETERS) ×2 IMPLANT
CATH WEB BI DIR CSDF CRV REPRO (CATHETERS) ×1 IMPLANT
CLOSURE PERCLOSE PROSTYLE (VASCULAR PRODUCTS) ×8 IMPLANT
COVER SWIFTLINK CONNECTOR (BAG) ×2 IMPLANT
KIT VERSACROSS STEERABLE D1 (CATHETERS) ×1 IMPLANT
MAT PREVALON FULL STRYKER (MISCELLANEOUS) ×1 IMPLANT
PACK EP LATEX FREE (CUSTOM PROCEDURE TRAY) ×2
PACK EP LF (CUSTOM PROCEDURE TRAY) ×1 IMPLANT
PAD PRO RADIOLUCENT 2001M-C (PAD) ×2 IMPLANT
PATCH CARTO3 (PAD) ×1 IMPLANT
PENTARAY F 2-6-2MM (CATHETERS) ×2
SHEATH CARTO VIZIGO SM CVD (SHEATH) ×1 IMPLANT
SHEATH PINNACLE 7F 10CM (SHEATH) ×1 IMPLANT
SHEATH PINNACLE 8F 10CM (SHEATH) ×2 IMPLANT
SHEATH PINNACLE 9F 10CM (SHEATH) ×1 IMPLANT
SHEATH PROBE COVER 6X72 (BAG) ×1 IMPLANT
TUBING SMART ABLATE COOLFLOW (TUBING) ×1 IMPLANT

## 2020-10-24 NOTE — Transfer of Care (Signed)
Immediate Anesthesia Transfer of Care Note  Patient: Michael Hartman  Procedure(s) Performed: ATRIAL FIBRILLATION ABLATION (N/A )  Patient Location: Cath Lab  Anesthesia Type:General  Level of Consciousness: awake, alert  and oriented  Airway & Oxygen Therapy: Patient Spontanous Breathing and Patient connected to nasal cannula oxygen  Post-op Assessment: Report given to RN, Post -op Vital signs reviewed and stable and Patient moving all extremities  Post vital signs: Reviewed and stable    Last Vitals:  Vitals Value Taken Time  BP 153/92 10/24/20 1121  Temp    Pulse 83 10/24/20 1122  Resp 13 10/24/20 1122  SpO2 97 % 10/24/20 1122  Vitals shown include unvalidated device data.  Last Pain:  Vitals:   10/24/20 0719  TempSrc:   PainSc: 0-No pain         Complications: No complications documented.

## 2020-10-24 NOTE — Anesthesia Postprocedure Evaluation (Signed)
Anesthesia Post Note  Patient: Michael Hartman  Procedure(s) Performed: ATRIAL FIBRILLATION ABLATION (N/A )     Patient location during evaluation: PACU Anesthesia Type: General Level of consciousness: sedated and patient cooperative Pain management: pain level controlled Vital Signs Assessment: post-procedure vital signs reviewed and stable Respiratory status: spontaneous breathing Cardiovascular status: stable Anesthetic complications: no   No complications documented.  Last Vitals:  Vitals:   10/24/20 1430 10/24/20 1500  BP: (!) 138/91 (!) 149/89  Pulse: 85 81  Resp: 15 16  Temp:    SpO2: 98% 97%    Last Pain:  Vitals:   10/24/20 1225  TempSrc:   PainSc: 0-No pain                 Lewie Loron

## 2020-10-24 NOTE — Progress Notes (Signed)
Discharge instructions reviewed with pt and his wife. Both voice understanding. 

## 2020-10-24 NOTE — Progress Notes (Signed)
Dr Cecilie Kicks about eliquis dose that was ordered for 1300 today. Pt has some ooze while in holding area  At  left groin. Dr  Elberta Fortis informed and states to have at take at home tonight.

## 2020-10-24 NOTE — Anesthesia Procedure Notes (Signed)
Procedure Name: Intubation Date/Time: 10/24/2020 8:41 AM Performed by: Amadeo Garnet, CRNA Pre-anesthesia Checklist: Patient identified, Emergency Drugs available, Suction available and Patient being monitored Patient Re-evaluated:Patient Re-evaluated prior to induction Oxygen Delivery Method: Circle system utilized Preoxygenation: Pre-oxygenation with 100% oxygen Induction Type: IV induction Ventilation: Mask ventilation without difficulty Laryngoscope Size: Mac and 4 Grade View: Grade II Tube type: Oral Tube size: 7.5 mm Number of attempts: 1 Airway Equipment and Method: Stylet and Oral airway Placement Confirmation: ETT inserted through vocal cords under direct vision,  positive ETCO2 and breath sounds checked- equal and bilateral Secured at: 22 cm Tube secured with: Tape Dental Injury: Teeth and Oropharynx as per pre-operative assessment

## 2020-10-24 NOTE — H&P (Signed)
Electrophysiology Office Note   Date:  10/24/2020   ID:  Michael Hartman, Michael Hartman 1956-04-20, MRN [id number]  PCP:  Gaspar Garbe, MD  Cardiologist:  [place] Primary Electrophysiologist:  Regan Lemming, MD    Chief Complaint: AFl   History of Present Illness: Michael Hartman is a 65 y.o. male who is being seen today for the evaluation of atrial flutter at the request of No ref. provider found. Presenting today for electrophysiology evaluation.  He has a history of sleep apnea, hypertension, hyperlipidemia, Beatties, coronary disease, celiac disease, and atrial flutter.  He was diagnosed with atrial flutter in 2018.  He had a cardioversion 01/08/2017.  He is currently on Eliquis.  He was found to be back in atrial flutter 07/16/2020.  He had increased fatigue.  He is now status post cardioversion 08/01/2020.  Today, denies symptoms of palpitations, chest pain, shortness of breath, orthopnea, PND, lower extremity edema, claudication, dizziness, presyncope, syncope, bleeding, or neurologic sequela. The patient is tolerating medications without difficulties. Plan ablation today.    Past Medical History:  Diagnosis Date  . Arthritis   . Atrial fibrillation   . Atrial flutter    a. diagnosed in 11/2016 --> started on Eliquis for anticoagulation b. s/p DCCV in 01/2017  . CAD (coronary artery disease) 08/02/2007  . Celiac disease   . Chronic rhinitis 08/02/2007  . Coronary artery disease    a. s/p remote stenting of RCA in 1998  . Diabetes mellitus type 2 03/23/2012  . Essential hypertension 08/02/2007  . Gastro-esophageal reflux disease with esophagitis 09/19/2020  . History of migraine headaches   . MCI (mild cognitive impairment) 09/19/2020  . OSA (obstructive sleep apnea)    uses CPAP regularly  . Pure hypercholesterolemia 08/02/2007  . Secondary hypercoagulable state 07/26/2020   Past Surgical History:  Procedure Laterality Date  . ANKLE ARTHROPLASTY    . CARDIAC  CATHETERIZATION  06/06/1997   EF 65%  . CARDIOVASCULAR STRESS TEST  12/07/2007   EF 58%  . CARDIOVERSION N/A 01/08/2017   Procedure: CARDIOVERSION;  Surgeon: Wendall Stade, MD;  Location: [facility] ENDOSCOPY;  Service: Cardiovascular;  Laterality: N/A;  . CARDIOVERSION N/A 08/01/2020   Procedure: CARDIOVERSION;  Surgeon: [name], MD;  Location: [facility] ENDOSCOPY;  Service: Cardiovascular;  Laterality: N/A;  . CORONARY STENT PLACEMENT    . ELBOW ARTHROPLASTY    . EYE SURGERY    . KNEE ARTHROPLASTY    . NASAL SINUS SURGERY    . SHOULDER ACROMIOPLASTY    . SPINE SURGERY       Current Facility-Administered Medications  Medication Dose Route Frequency Provider Last Rate Last Admin  . 0.9 %  sodium chloride infusion   Intravenous Continuous Regan Lemming, MD 50 mL/hr at 10/24/20 0715 New Bag at 10/24/20 0715    Allergies:   Ambien [zolpidem tartrate], Amoxicillin, and Penicillins   Social History:  The patient  reports that he quit smoking about 35 years ago. He has a 80.00 pack-year smoking history. He has quit using smokeless tobacco. He reports previous drug use. He reports that he does not drink alcohol.   Family History:  The patient's family history includes Alzheimer's disease in his mother; Diabetes in his mother; Hypertension in his brother, father, and mother; Stroke in his father.   ROS:  Please see the history of present illness.   Otherwise, review of systems is positive for none.   All other systems are reviewed and negative.  PHYSICAL EXAM: VS:  BP (!) 151/92   Pulse (!) 56   Temp 98.2 F (36.8 C) (Oral)   Ht 6\' 2"  (1.88 m)   Wt 96.2 kg   SpO2 96%   BMI 27.22 kg/m  , BMI Body mass index is 27.22 kg/m. GEN: Well nourished, well developed, in no acute distress  HEENT: normal  Neck: no JVD, carotid bruits, or masses Cardiac: iRRR; no murmurs, rubs, or gallops,no edema  Respiratory:  clear to auscultation bilaterally, normal work of breathing GI: soft,  nontender, nondistended, + BS MS: no deformity or atrophy  Skin: warm and dry Neuro:  Strength and sensation are intact Psych: euthymic mood, full affect   Recent Labs: 10/04/2020: BUN 16; Creatinine, Ser 0.92; Hemoglobin 15.1; Platelets 350; Potassium 4.8; Sodium 142    Lipid Panel     Component Value Date/Time   CHOL 136 11/18/2016 1030   TRIG 217 (H) 11/18/2016 1030   HDL 39 (L) 11/18/2016 1030   CHOLHDL 3.5 11/18/2016 1030   LDLCALC 54 11/18/2016 1030     Wt Readings from Last 3 Encounters:  10/24/20 96.2 kg  10/16/20 99.6 kg  09/11/20 97.8 kg      Other studies Reviewed: Additional studies/ records that were reviewed today include: TTE 08/10/20  Review of the above records today demonstrates:  1. Left ventricular ejection fraction, by estimation, is 60 to 65%. The  left ventricle has normal function. The left ventricle has no regional  wall motion abnormalities. Left ventricular diastolic parameters are  indeterminate.  2. Right ventricular systolic function is normal. The right ventricular  size is mildly enlarged. Tricuspid regurgitation signal is inadequate for  assessing PA pressure.  3. Left atrial size was moderately dilated.  4. Right atrial size was moderately dilated.  5. The mitral valve is normal in structure. Trivial mitral valve  regurgitation. No evidence of mitral stenosis.  6. The aortic valve is tricuspid. Aortic valve regurgitation is not  visualized. No aortic stenosis is present.  7. The inferior vena cava is normal in size with greater than 50%  respiratory variability, suggesting right atrial pressure of 3 mmHg.    ASSESSMENT AND PLAN:  1.  Typical appearing atrial flutter/atrial fibrillation: [name] has presented today for surgery, with the diagnosis of atrial fibrillaiton.  The various methods of treatment have been discussed with the patient and family. After consideration of risks, benefits and other options for treatment,  the patient has consented to  Procedure(s): Catheter ablation as a surgical intervention .  Risks include but not limited to complete heart block, stroke, esophageal damage, nerve damage, bleeding, vascular damage, tamponade, perforation, MI, and death. The patient's history has been reviewed, patient examined, no change in status, stable for surgery.  I have reviewed the patient's chart and labs.  Questions were answered to the patient's satisfaction.    Darlena Koval [name], MD 10/24/2020 8:06 AM

## 2020-10-24 NOTE — Anesthesia Preprocedure Evaluation (Addendum)
Anesthesia Evaluation  Patient identified by MRN, date of birth, ID band Patient awake    Reviewed: Allergy & Precautions, NPO status , Patient's Chart, lab work & pertinent test results  Airway Mallampati: II  TM Distance: >3 FB Neck ROM: Full    Dental  (+) Dental Advisory Given, Teeth Intact, Chipped, Missing,    Pulmonary sleep apnea , former smoker,    Pulmonary exam normal breath sounds clear to auscultation       Cardiovascular hypertension, Pt. on home beta blockers and Pt. on medications + CAD and + Cardiac Stents  + dysrhythmias Atrial Fibrillation  Rhythm:Irregular Rate:Normal     Neuro/Psych negative neurological ROS  negative psych ROS   GI/Hepatic Neg liver ROS, GERD  ,  Endo/Other  diabetes  Renal/GU negative Renal ROS     Musculoskeletal  (+) Arthritis ,   Abdominal   Peds  Hematology negative hematology ROS (+)   Anesthesia Other Findings   Reproductive/Obstetrics                            Anesthesia Physical  Anesthesia Plan  ASA: III  Anesthesia Plan: General   Post-op Pain Management:    Induction: Intravenous  PONV Risk Score and Plan: 2 and Ondansetron, Dexamethasone, Midazolam and Treatment may vary due to age or medical condition  Airway Management Planned: Oral ETT  Additional Equipment: None  Intra-op Plan:   Post-operative Plan: Extubation in OR  Informed Consent: I have reviewed the patients History and Physical, chart, labs and discussed the procedure including the risks, benefits and alternatives for the proposed anesthesia with the patient or authorized representative who has indicated his/her understanding and acceptance.     Dental advisory given  Plan Discussed with: CRNA  Anesthesia Plan Comments: (Echo:  - Left ventricle: The cavity size was mildly dilated. There was  mild focal basal hypertrophy of the septum. Systolic function  was  normal. The estimated ejection fraction was 55%. Wall motion was  normal; there were no regional wall motion abnormalities.  - Aortic valve: Trileaflet; mildly thickened, mildly calcified  leaflets.  - Mitral valve: Calcified annulus.  - Left atrium: The atrium was moderately dilated.  - Right ventricle: The cavity size was mildly dilated. Wall  thickness was normal.  - Pulmonic valve: There was trivial regurgitation. )       Anesthesia Quick Evaluation

## 2020-10-24 NOTE — Progress Notes (Signed)
Additional manual pressures held to left groin for 20 mins.  Pressure dressing applied.  Bedrest started aty 1200p for 4 hours

## 2020-10-24 NOTE — Discharge Instructions (Signed)

## 2020-10-25 ENCOUNTER — Encounter (HOSPITAL_COMMUNITY): Payer: Self-pay | Admitting: Cardiology

## 2020-10-30 ENCOUNTER — Other Ambulatory Visit: Payer: Medicare Other

## 2020-11-12 ENCOUNTER — Ambulatory Visit
Admission: RE | Admit: 2020-11-12 | Discharge: 2020-11-12 | Disposition: A | Discharge status: Home / Self Care | Payer: Medicare Other | Source: Ambulatory Visit | Attending: Neurology | Admitting: Neurology

## 2020-11-12 DIAGNOSIS — G3184 Mild cognitive impairment, so stated: Secondary | ICD-10-CM

## 2020-11-12 DIAGNOSIS — R413 Other amnesia: Secondary | ICD-10-CM

## 2020-11-12 DIAGNOSIS — I4892 Unspecified atrial flutter: Secondary | ICD-10-CM

## 2020-11-15 ENCOUNTER — Telehealth: Payer: Self-pay

## 2020-11-15 NOTE — Progress Notes (Signed)
Please call patient and advise him that his recent head CT without contrast did not show any acute findings and no obvious abnormalities, findings were in keeping with age-appropriate and chronic changes, there was some evidence of hardening of the arteries affecting the carotid arteries as well as the vertebral arteries, which supplied the back of the brain.  No obvious cause for memory loss was seen.  He can follow-up as scheduled with the nurse practitioner, Amy Lomax.

## 2020-11-15 NOTE — Telephone Encounter (Signed)
I called pt and was connected with his wife Joyce Gross  ( ok per dpr). I relayed results of CT to his wife and she will let the pt know and have him CB if he has any questions.

## 2020-11-15 NOTE — Telephone Encounter (Signed)
Pt called back and we discussed results. Pt verbalized understanding and had no questions/concerns.  Pt will keep f/u as scheduled for 04/24/21 with Al, NP.

## 2020-11-15 NOTE — Telephone Encounter (Signed)
-----   Message from Huston Foley, MD sent at 11/15/2020  7:30 AM EDT ----- Please call patient and advise him that his recent head CT without contrast did not show any acute findings and no obvious abnormalities, findings were in keeping with age-appropriate and chronic changes, there was some evidence of hardening of the arteries affecting the carotid arteries as well as the vertebral arteries, which supplied the back of the brain.  No obvious cause for memory loss was seen.  He can follow-up as scheduled with the nurse practitioner, Amy Lomax.

## 2020-11-21 ENCOUNTER — Other Ambulatory Visit: Payer: Self-pay

## 2020-11-21 ENCOUNTER — Encounter (HOSPITAL_COMMUNITY): Payer: Self-pay | Admitting: Physician Assistant

## 2020-11-21 ENCOUNTER — Ambulatory Visit (HOSPITAL_COMMUNITY)
Admission: RE | Admit: 2020-11-21 | Discharge: 2020-11-21 | Disposition: A | Discharge status: Home / Self Care | Payer: Medicare Other | Source: Ambulatory Visit | Attending: Physician Assistant | Admitting: Physician Assistant

## 2020-11-21 ENCOUNTER — Other Ambulatory Visit: Payer: Self-pay | Admitting: Cardiology

## 2020-11-21 VITALS — BP 102/66 | HR 52 | Ht 74.0 in | Wt 208.8 lb

## 2020-11-21 DIAGNOSIS — D6869 Other thrombophilia: Secondary | ICD-10-CM

## 2020-11-21 DIAGNOSIS — E785 Hyperlipidemia, unspecified: Secondary | ICD-10-CM | POA: Diagnosis not present

## 2020-11-21 DIAGNOSIS — Z79899 Other long term (current) drug therapy: Secondary | ICD-10-CM | POA: Insufficient documentation

## 2020-11-21 DIAGNOSIS — Z7901 Long term (current) use of anticoagulants: Secondary | ICD-10-CM | POA: Insufficient documentation

## 2020-11-21 DIAGNOSIS — I4819 Other persistent atrial fibrillation: Secondary | ICD-10-CM | POA: Diagnosis present

## 2020-11-21 DIAGNOSIS — E7800 Pure hypercholesterolemia, unspecified: Secondary | ICD-10-CM | POA: Insufficient documentation

## 2020-11-21 DIAGNOSIS — I119 Hypertensive heart disease without heart failure: Secondary | ICD-10-CM | POA: Insufficient documentation

## 2020-11-21 DIAGNOSIS — Z8249 Family history of ischemic heart disease and other diseases of the circulatory system: Secondary | ICD-10-CM | POA: Insufficient documentation

## 2020-11-21 DIAGNOSIS — I4892 Unspecified atrial flutter: Secondary | ICD-10-CM | POA: Insufficient documentation

## 2020-11-21 DIAGNOSIS — I2510 Atherosclerotic heart disease of native coronary artery without angina pectoris: Secondary | ICD-10-CM | POA: Diagnosis not present

## 2020-11-21 DIAGNOSIS — Z955 Presence of coronary angioplasty implant and graft: Secondary | ICD-10-CM | POA: Insufficient documentation

## 2020-11-21 DIAGNOSIS — G4733 Obstructive sleep apnea (adult) (pediatric): Secondary | ICD-10-CM | POA: Insufficient documentation

## 2020-11-21 DIAGNOSIS — I480 Paroxysmal atrial fibrillation: Secondary | ICD-10-CM

## 2020-11-21 DIAGNOSIS — Z87891 Personal history of nicotine dependence: Secondary | ICD-10-CM | POA: Diagnosis not present

## 2020-11-21 NOTE — Progress Notes (Addendum)
Primary Care Physician: Gaspar Garbe, MD Primary Cardiologist: Dr [place] Primary Electrophysiologist: Dr Elberta Fortis Referring Physician: Edd Fabian   Michael Hartman is a 65 y.o. male with a history of OSA, HTN, HLD, DM, CAD, Celiac disease, and atrial flutter who presents for follow up in the [facility].  The patient was initially diagnosed with atrial flutter 11/2016 after presenting with symptoms of increased fatigue. He underwent DCCV on 01/08/17. Patient is on Eliquis for a CHADS2VASC score of 4. Patient was in his usual state of health until 07/16/20 when he was noted to be back out of rhythm at his neurology office visit. In hindsight, he has noted increased fatigue recently. There were no specific triggers that he could identify. Patient underwent DCCV on 08/01/20 which converted him from atrial flutter to afib. He was seen by Dr Elberta Fortis 08/28/20 and underwent afib and flutter ablation on 10/24/20.  On follow up today, patient reports he has done well since the ablation. He does feel that he has more energy in SR. He denies CP, swallowing pain, or groin issues.   Today, he denies symptoms of palpitations, chest pain, shortness of breath, orthopnea, PND, lower extremity edema, dizziness, presyncope, syncope, snoring, daytime somnolence, bleeding, or neurologic sequela. The patient is tolerating medications without difficulties and is otherwise without complaint today.    Atrial Fibrillation Risk Factors:  he does have symptoms or diagnosis of sleep apnea. he is compliant with CPAP therapy. he does not have a history of rheumatic fever.   he has a BMI of Body mass index is 26.81 kg/m.Marland Kitchen Filed Weights   11/21/20 1413  Weight: 94.7 kg     Family History  Problem Relation Age of Onset   Hypertension Mother    Diabetes Mother    Alzheimer's disease Mother    Stroke Father    Hypertension Father    Hypertension Brother      Atrial Fibrillation  Management history:  Previous antiarrhythmic drugs: none Previous cardioversions: 01/2017 Previous ablations: afib and flutter 10/24/20 CHADS2VASC score: 4 Anticoagulation history: Eliquis   Past Medical History:  Diagnosis Date   Arthritis    Atrial fibrillation    Atrial flutter    a. diagnosed in 11/2016 --> started on Eliquis for anticoagulation b. s/p DCCV in 01/2017   CAD (coronary artery disease) 08/02/2007   Celiac disease    Chronic rhinitis 08/02/2007   Coronary artery disease    a. s/p remote stenting of RCA in 1998   Diabetes mellitus type 2 03/23/2012   Essential hypertension 08/02/2007   Gastro-esophageal reflux disease with esophagitis 09/19/2020   History of migraine headaches    MCI (mild cognitive impairment) 09/19/2020   OSA (obstructive sleep apnea)    uses CPAP regularly   Pure hypercholesterolemia 08/02/2007   Secondary hypercoagulable state 07/26/2020   Past Surgical History:  Procedure Laterality Date   ANKLE ARTHROPLASTY     ATRIAL FIBRILLATION ABLATION N/A 10/24/2020   Procedure: ATRIAL FIBRILLATION ABLATION;  Surgeon: Regan Lemming, MD;  Location: [facility] INVASIVE CV LAB;  Service: Cardiovascular;  Laterality: N/A;   CARDIAC CATHETERIZATION  06/06/1997   EF 65%   CARDIOVASCULAR STRESS TEST  12/07/2007   EF 58%   CARDIOVERSION N/A 01/08/2017   Procedure: CARDIOVERSION;  Surgeon: Wendall Stade, MD;  Location: [facility] ENDOSCOPY;  Service: Cardiovascular;  Laterality: N/A;   CARDIOVERSION N/A 08/01/2020   Procedure: CARDIOVERSION;  Surgeon: [name], MD;  Location: [facility] ENDOSCOPY;  Service: Cardiovascular;  Laterality: N/A;   CORONARY STENT PLACEMENT     ELBOW ARTHROPLASTY     EYE SURGERY     KNEE ARTHROPLASTY     NASAL SINUS SURGERY     SHOULDER ACROMIOPLASTY     SPINE SURGERY      Current Outpatient Medications  Medication Sig Dispense Refill   acetaminophen (TYLENOL) 500 MG tablet Take 1,000 mg by mouth every 6 (six) hours as needed for  mild pain or headache.     atorvastatin (LIPITOR) 80 MG tablet TAKE 1 TABLET BY MOUTH  DAILY 90 tablet 0   diltiazem (CARDIZEM CD) 180 MG 24 hr capsule TAKE 1 CAPSULE BY MOUTH EVERY DAY 90 capsule 3   docusate sodium (COLACE) 100 MG capsule Take 100 mg by mouth in the morning.     ELIQUIS 5 MG TABS tablet TAKE 1 TABLET BY MOUTH  TWICE DAILY 180 tablet 3   FARXIGA 10 MG TABS tablet Take 10 mg by mouth in the morning.     fluticasone (FLONASE) 50 MCG/ACT nasal spray Place 1 spray into both nostrils at bedtime.     metFORMIN (GLUCOPHAGE) 500 MG tablet Take 500 mg by mouth 2 (two) times daily.     methocarbamol (ROBAXIN) 500 MG tablet Take 500 mg by mouth 2 (two) times daily as needed for muscle spasms.     metoprolol tartrate (LOPRESSOR) 50 MG tablet TAKE 1 TABLET BY MOUTH  TWICE DAILY 180 tablet 3   morphine (KADIAN) 30 MG 24 hr capsule Take 30 mg by mouth every 12 (twelve) hours.     Multiple Vitamin (MULTIVITAMIN WITH MINERALS) TABS tablet Take 1 tablet by mouth in the morning.     nitroGLYCERIN (NITROSTAT) 0.4 MG SL tablet Place 1 tablet (0.4 mg total) under the tongue every 5 (five) minutes as needed for chest pain. 25 tablet 11   ONE TOUCH ULTRA TEST test strip 1 each by Other route as needed. Use 1 strip to check glucose daily 100 each 4   oxyCODONE-acetaminophen (PERCOCET) 10-325 MG per tablet Take 1 tablet by mouth every 6 (six) hours as needed for pain.     TRADJENTA 5 MG TABS tablet Take 5 mg by mouth in the morning.     No current facility-administered medications for this encounter.    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   Social Determinants of Health  Financial Resource Strain: Not on file  Food Insecurity: Not on file  Transportation Needs: Not on file  Physical Activity: Not on file  Stress: Not on file  Social Connections: Not on file  Intimate Partner Violence: Not on file     ROS- All systems are reviewed and negative except as per the HPI above.  Physical Exam: Vitals:   11/21/20 1413  BP: 102/66  Pulse: (!) 52  Weight: 94.7 kg  Height: 6\' 2"  (1.88 m)    GEN- The patient is a well appearing male, alert and oriented x 3 today.   HEENT-head normocephalic, atraumatic, sclera clear, conjunctiva pink, hearing intact, trachea midline. Lungs- Clear to ausculation bilaterally, normal work of breathing Heart- Regular rate and rhythm, occasional ectopic beat, no murmurs, rubs or gallops  GI- soft, NT, ND, + BS Extremities- no clubbing, cyanosis, or edema MS- no significant deformity or atrophy Skin- no rash or lesion Psych- euthymic mood, full affect Neuro- strength and sensation are intact   Wt Readings from Last 3 Encounters:  11/21/20 94.7 kg  10/24/20 96.2 kg  10/16/20 99.6 kg    EKG today demonstrates  SB, frequent PACs in bigeminal pattern Vent. rate 52 BPM PR interval 156 ms QRS duration 86 ms QT/QTcB 396/368 ms  Echo 08/10/20 demonstrated  1. Left ventricular ejection fraction, by estimation, is 60 to 65%. The  left ventricle has normal function. The left  ventricle has no regional  wall motion abnormalities. Left ventricular diastolic parameters are  indeterminate.   2. Right ventricular systolic function is normal. The right ventricular  size is mildly enlarged. Tricuspid regurgitation signal is inadequate for  assessing PA pressure.   3. Left atrial size was moderately dilated.   4. Right atrial size was moderately dilated.   5. The mitral valve is normal in structure. Trivial mitral valve  regurgitation. No evidence of mitral stenosis.   6. The aortic valve is tricuspid. Aortic valve regurgitation is not  visualized. No aortic stenosis is present.   7. The inferior vena cava is normal in size with greater than 50%  respiratory variability, suggesting right atrial pressure of 3 mmHg.   Epic records are reviewed at length today  CHA2DS2-VASc Score = 4  The patient's score is based upon: CHF History: No HTN History: Yes Diabetes History: Yes Stroke History: No Vascular Disease History: Yes Age Score: 1 Gender Score: 0      ASSESSMENT AND PLAN: 1. Persistent atrial fibrillation/Atrial flutter The patient's CHA2DS2-VASc score is 4, indicating a 4.8% annual risk of stroke.   S/p afib and flutter ablation 10/24/20 Patient appears to be maintaining SR. Continue diltiazem 180 mg daily Continue Eliquis 5 mg BID Continue Lopressor 50 mg BID  2. Secondary Hypercoagulable State (ICD10:  D68.69) The patient is at significant risk for stroke/thromboembolism based upon his CHA2DS2-VASc Score of 4.  Continue Apixaban (Eliquis).   3. Obstructive sleep apnea Patient reports compliance with CPAP therapy.  4. HTN Stable, no changes today.  5. CAD No anginal symptoms.   Follow up with Dr 10/26/20 as scheduled.    Elberta Fortis PA-C [facility] [address] [place], [place] [phone] 11/21/2020 2:21 PM

## 2021-01-22 ENCOUNTER — Ambulatory Visit: Payer: Medicare Other | Admitting: Cardiology

## 2021-03-18 ENCOUNTER — Ambulatory Visit: Payer: Medicare Other | Admitting: Cardiology

## 2021-03-18 ENCOUNTER — Encounter: Payer: Self-pay | Admitting: Cardiology

## 2021-03-18 ENCOUNTER — Other Ambulatory Visit: Payer: Self-pay

## 2021-03-18 ENCOUNTER — Ambulatory Visit (INDEPENDENT_AMBULATORY_CARE_PROVIDER_SITE_OTHER): Payer: Medicare Other | Admitting: Cardiology

## 2021-03-18 VITALS — BP 144/88 | HR 78 | Ht 74.0 in | Wt 215.0 lb

## 2021-03-18 DIAGNOSIS — I4819 Other persistent atrial fibrillation: Secondary | ICD-10-CM

## 2021-03-18 MED ORDER — METOPROLOL SUCCINATE ER 100 MG PO TB24: 100.0000 mg | ORAL_TABLET | Freq: Every day | ORAL | 1 refills | Status: DC

## 2021-03-18 NOTE — Progress Notes (Signed)
Electrophysiology Office Note   Date:  03/18/2021   ID:  Michael Hartman, Mariani [birth year], MRN [id number]  PCP:  Gaspar Garbe, MD  Cardiologist:  [place] Primary Electrophysiologist:  Regan Lemming, MD    Chief Complaint: AFl   History of Present Illness: Michael Hartman is a 65 y.o. male who is being seen today for the evaluation of atrial flutter at the request of [name], Adelfa Koh, MD. Presenting today for electrophysiology evaluation.  He has a history significant for sleep apnea, hypertension, diabetes, hyperlipidemia, coronary artery disease, celiac disease, atrial fibrillation, and atrial flutter.  He is status post ablation 10/24/2020.  Today, denies symptoms of palpitations, chest pain, shortness of breath, orthopnea, PND, lower extremity edema, claudication, dizziness, presyncope, syncope, bleeding, or neurologic sequela. The patient is tolerating medications without difficulties.  Since his ablation he has done well.  He has had no chest pain or shortness of breath.  Is able do all his daily activities without restriction.  He is unaware of any further episodes of atrial fibrillation.  He is overall happy with his control.   Past Medical History:  Diagnosis Date   Arthritis    Atrial fibrillation    Atrial flutter    a. diagnosed in 11/2016 --> started on Eliquis for anticoagulation b. s/p DCCV in 01/2017   CAD (coronary artery disease) 08/02/2007   Celiac disease    Chronic rhinitis 08/02/2007   Coronary artery disease    a. s/p remote stenting of RCA in 1998   Diabetes mellitus type 2 03/23/2012   Essential hypertension 08/02/2007   Gastro-esophageal reflux disease with esophagitis 09/19/2020   History of migraine headaches    MCI (mild cognitive impairment) 09/19/2020   OSA (obstructive sleep apnea)    uses CPAP regularly   Pure hypercholesterolemia 08/02/2007   Secondary hypercoagulable state 07/26/2020   Past Surgical History:  Procedure Laterality  Date   ANKLE ARTHROPLASTY     ATRIAL FIBRILLATION ABLATION N/A 10/24/2020   Procedure: ATRIAL FIBRILLATION ABLATION;  Surgeon: Regan Lemming, MD;  Location: [facility];  Service: Cardiovascular;  Laterality: N/A;   CARDIAC CATHETERIZATION  06/06/1997   EF 65%   CARDIOVASCULAR STRESS TEST  12/07/2007   EF 58%   CARDIOVERSION N/A 01/08/2017   Procedure: CARDIOVERSION;  Surgeon: Wendall Stade, MD;  Location: [facility] ENDOSCOPY;  Service: Cardiovascular;  Laterality: N/A;   CARDIOVERSION N/A 08/01/2020   Procedure: CARDIOVERSION;  Surgeon: [name], MD;  Location: [facility] ENDOSCOPY;  Service: Cardiovascular;  Laterality: N/A;   CORONARY STENT PLACEMENT     ELBOW ARTHROPLASTY     EYE SURGERY     KNEE ARTHROPLASTY     NASAL SINUS SURGERY     SHOULDER ACROMIOPLASTY     SPINE SURGERY       Current Outpatient Medications  Medication Sig Dispense Refill   acetaminophen (TYLENOL) 500 MG tablet Take 1,000 mg by mouth every 6 (six) hours as needed for mild pain or headache.     atorvastatin (LIPITOR) 80 MG tablet TAKE 1 TABLET BY MOUTH  DAILY 90 tablet 0   docusate sodium (COLACE) 100 MG capsule Take 100 mg by mouth in the morning.     ELIQUIS 5 MG TABS tablet TAKE 1 TABLET BY MOUTH  TWICE DAILY 180 tablet 3   FARXIGA 10 MG TABS tablet Take 10 mg by mouth in the morning.     fluticasone (FLONASE) 50 MCG/ACT nasal spray Place 1 spray into  both nostrils at bedtime.     metFORMIN (GLUCOPHAGE) 500 MG tablet Take 500 mg by mouth 2 (two) times daily.     methocarbamol (ROBAXIN) 500 MG tablet Take 500 mg by mouth 2 (two) times daily as needed for muscle spasms.     morphine (KADIAN) 30 MG 24 hr capsule Take 30 mg by mouth every 12 (twelve) hours.     Multiple Vitamin (MULTIVITAMIN WITH MINERALS) TABS tablet Take 1 tablet by mouth in the morning.     nitroGLYCERIN (NITROSTAT) 0.4 MG SL tablet Place 1 tablet (0.4 mg total) under the tongue every 5 (five) minutes as needed for chest pain. 25  tablet 11   ONE TOUCH ULTRA TEST test strip 1 each by Other route as needed. Use 1 strip to check glucose daily 100 each 4   oxyCODONE-acetaminophen (PERCOCET) 10-325 MG per tablet Take 1 tablet by mouth every 6 (six) hours as needed for pain.     TRADJENTA 5 MG TABS tablet Take 5 mg by mouth in the morning.     metoprolol succinate (TOPROL-XL) 100 MG 24 hr tablet Take 1 tablet (100 mg total) by mouth daily. Take with or immediately following a meal. 90 tablet 1   No current facility-administered medications for this visit.    Allergies:   Ambien [zolpidem tartrate], Amoxicillin, and Penicillins   Social History:  The patient  reports that he quit smoking about 35 years ago. His smoking use included cigarettes. He has a 80.00 pack-year smoking history. He has quit using smokeless tobacco. He reports that he does not currently use drugs. He reports that he does not drink alcohol.   Family History:  The patient's family history includes Alzheimer's disease in his mother; Diabetes in his mother; Hypertension in his brother, father, and mother; Stroke in his father.   ROS:  Please see the history of present illness.   Otherwise, review of systems is positive for none.   All other systems are reviewed and negative.   PHYSICAL EXAM: VS:  BP (!) 144/88   Pulse 78   Ht 6\' 2"  (1.88 m)   Wt 215 lb (97.5 kg)   SpO2 96%   BMI 27.60 kg/m  , BMI Body mass index is 27.6 kg/m. GEN: Well nourished, well developed, in no acute distress  HEENT: normal  Neck: no JVD, carotid bruits, or masses Cardiac: RRR; no murmurs, rubs, or gallops,no edema  Respiratory:  clear to auscultation bilaterally, normal work of breathing GI: soft, nontender, nondistended, + BS MS: no deformity or atrophy  Skin: warm and dry Neuro:  Strength and sensation are intact Psych: euthymic mood, full affect  EKG:  EKG is ordered today. Personal review of the ekg ordered shows sinus rhythm, PVC  Recent Labs: 10/04/2020: BUN  16; Creatinine, Ser 0.92; Hemoglobin 15.1; Platelets 350; Potassium 4.8; Sodium 142    Lipid Panel     Component Value Date/Time   CHOL 136 11/18/2016 1030   TRIG 217 (H) 11/18/2016 1030   HDL 39 (L) 11/18/2016 1030   CHOLHDL 3.5 11/18/2016 1030   LDLCALC 54 11/18/2016 1030     Wt Readings from Last 3 Encounters:  03/18/21 215 lb (97.5 kg)  11/21/20 208 lb 12.8 oz (94.7 kg)  10/24/20 212 lb (96.2 kg)      Other studies Reviewed: Additional studies/ records that were reviewed today include: TTE 08/10/20  Review of the above records today demonstrates:   1. Left ventricular ejection fraction, by estimation, is  60 to 65%. The  left ventricle has normal function. The left ventricle has no regional  wall motion abnormalities. Left ventricular diastolic parameters are  indeterminate.   2. Right ventricular systolic function is normal. The right ventricular  size is mildly enlarged. Tricuspid regurgitation signal is inadequate for  assessing PA pressure.   3. Left atrial size was moderately dilated.   4. Right atrial size was moderately dilated.   5. The mitral valve is normal in structure. Trivial mitral valve  regurgitation. No evidence of mitral stenosis.   6. The aortic valve is tricuspid. Aortic valve regurgitation is not  visualized. No aortic stenosis is present.   7. The inferior vena cava is normal in size with greater than 50%  respiratory variability, suggesting right atrial pressure of 3 mmHg.    ASSESSMENT AND PLAN:  1.  Paroxysmal atrial fibrillation, typical atrial flutter: CHA2DS2-VASc of 4.  Currently on Eliquis 5 mg twice daily and metoprolol.  He is now status post ablation 10/24/2020.  He has not had any further episodes of atrial fibrillation.  We Tempie Gibeault stop metoprolol and diltiazem and start Toprol-XL 100 mg daily.  2.  Obstructive sleep apnea: CPAP compliance encouraged  3.  Hypertension: Mildly elevated today.  Usually well controlled.  4.  Coronary  artery disease: No current chest pain.  Plan per primary cardiology.   Current medicines are reviewed at length with the patient today.   The patient does not have concerns regarding his medicines.  The following changes were made today: Start Toprol-XL  Labs/ tests ordered today include:  Orders Placed This Encounter  Procedures   EKG 12-Lead      Disposition:   FU with Darius Lundberg 3 months  Signed, Moses Odoherty Jorja Loa, MD  03/18/2021 2:05 PM     [facility] [address] [place] [address] [phone] (office) [phone] (fax)

## 2021-03-18 NOTE — Patient Instructions (Addendum)
Medication Instructions:  Your physician has recommended you make the following change in your medication:  STOP Metoprolol Tartrate (Lopressor) STOP Diltiazem START Metoprolol Succinate (Toprol) 100 mg once daily at bedtime  *If you need a refill on your cardiac medications before your next appointment, please call your pharmacy*   Lab Work: None ordered   Testing/Procedures: None ordered   Follow-Up: At [facility], you and your health needs are our priority.  As part of our continuing mission to provide you with exceptional heart care, we have created designated Provider Care Teams.  These Care Teams include your primary Cardiologist (physician) and Advanced Practice Providers (APPs -  Physician Assistants and Nurse Practitioners) who all work together to provide you with the care you need, when you need it.  Your next appointment:   3 month(s)  The format for your next appointment:   In Person  Provider:   Loman Brooklyn, MD    Thank you for choosing [facility]!!   Dory Horn, RN [phone]

## 2021-03-21 ENCOUNTER — Ambulatory Visit: Payer: Medicare Other | Admitting: Cardiology

## 2021-04-22 ENCOUNTER — Encounter: Payer: Self-pay | Admitting: Family Medicine

## 2021-04-23 NOTE — Progress Notes (Signed)
Chief Complaint  Patient presents with   Follow-up    Pt with wife, rm 2. Wife states that short term memory is still concerning, mixes apt times.      HISTORY OF PRESENT ILLNESS:  04/24/21 ALL:  Michael Hartman is a 65 y.o. male here today for follow up for MCI. He was last seen by Dr Francesco Sor 10/2020. Neurocognitive eval was suggestive of MCI but variable effort and results noted. Dementia medications were not advised and he was encouraged to follow up with PCP for concerns of anxiety and depression. CT head showed some calcifications of vertebral and internal carotid arteries with minimal atrophy. He presents with his wife who aids in history. He continues to note short term memory loss. 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 He reports being independent with ADLs. [name] does not dispute. He reports managing his medicaitons without difficulty. [name] expresses some concern with this statement. She reports that he will go to the pharmacy to fill medications earlier than he should. He tells me that he has ran out of his oxycodone 10-320m tablets. PDMP shows he picked up 90 tablets 10/31. After discussion he is not sure if this is the medication he is out of. He is also taking morphine 352mtwice daily. Fill date was also 10/31. He continues to drive without difficulty. No obvious concerns per his wife. He is more irritable. He feels that his wife is always questioning him and feels that "she always has to be right". He shugs when asked if he feels anxious or depressed. He seems to sleep well. Appetite is good. No gait changes or falls.    HISTORY (copied from Dr AtGuadelupe Sabinrevious note)   Michael Hartman a [age]ear old right-handed gentleman with an underlying complex medical history of coronary artery disease with status post stenting, paroxysmal A. fib with status post cardioversion in 2018, chronic rhinitis, reflux disease, diabetes, migraine headaches, sleep  apnea, on CPAP therapy, hyperlipidemia, hypertension, allergies, arthritis, low back pain, on chronic narcotic pain medication, and overweight state, who Presents for follow-up consultation of his memory loss, after interim neuropsychological evaluation.  The patient is accompanied by his wife today.  I first met him at the request of his primary care physician on 07/16/2020, at which time his wife reported concern about his short-term memory for the past 2 to 3 years.  He was repeating himself, forgetting conversations and asking the same question over again.  He had blood work through his primary care physician.  His MMSE during her first office visit was 26 out of 30.  He was advised to proceed with a brain MRI and he was advised to proceed with neuropsychological evaluation.  He was found to have an irregular heartbeat.  I suggested sooner than scheduled follow-up with his cardiologist.  REVIEW OF SYSTEMS: Out of a complete 14 system review of symptoms, the patient complains only of the following symptoms, short term memory loss, irritability and all other reviewed systems are negative.   ALLERGIES: Allergies  Allergen Reactions   Ambien [Zolpidem Tartrate]     Amnesia, odd behavior   Amoxicillin Rash   Penicillins Rash    Child hood     HOME MEDICATIONS: Outpatient Medications Prior to Visit  Medication Sig Dispense Refill   acetaminophen (TYLENOL) 500 MG tablet Take 1,000 mg by mouth every 6 (six) hours as needed for mild pain or headache.     atorvastatin (LIPITOR)  80 MG tablet TAKE 1 TABLET BY MOUTH  DAILY 90 tablet 0   docusate sodium (COLACE) 100 MG capsule Take 100 mg by mouth in the morning.     ELIQUIS 5 MG TABS tablet TAKE 1 TABLET BY MOUTH  TWICE DAILY 180 tablet 3   FARXIGA 10 MG TABS tablet Take 10 mg by mouth in the morning.     fluticasone (FLONASE) 50 MCG/ACT nasal spray Place 1 spray into both nostrils at bedtime.     metFORMIN (GLUCOPHAGE) 1000 MG tablet Take 1,000 mg by  mouth 2 (two) times daily.     metFORMIN (GLUCOPHAGE) 500 MG tablet Take 500 mg by mouth 2 (two) times daily.     methocarbamol (ROBAXIN) 500 MG tablet Take 500 mg by mouth 2 (two) times daily as needed for muscle spasms.     metoprolol succinate (TOPROL-XL) 100 MG 24 hr tablet Take 1 tablet (100 mg total) by mouth daily. Take with or immediately following a meal. 90 tablet 1   morphine (KADIAN) 30 MG 24 hr capsule Take 30 mg by mouth every 12 (twelve) hours.     Multiple Vitamin (MULTIVITAMIN WITH MINERALS) TABS tablet Take 1 tablet by mouth in the morning.     nitroGLYCERIN (NITROSTAT) 0.4 MG SL tablet Place 1 tablet (0.4 mg total) under the tongue every 5 (five) minutes as needed for chest pain. 25 tablet 11   ONE TOUCH ULTRA TEST test strip 1 each by Other route as needed. Use 1 strip to check glucose daily 100 each 4   oxyCODONE-acetaminophen (PERCOCET) 10-325 MG per tablet Take 1 tablet by mouth every 6 (six) hours as needed for pain.     tiZANidine (ZANAFLEX) 2 MG tablet Take 2 mg by mouth 3 (three) times daily as needed.     TRADJENTA 5 MG TABS tablet Take 5 mg by mouth in the morning.     No facility-administered medications prior to visit.     PAST MEDICAL HISTORY: Past Medical History:  Diagnosis Date   Arthritis    Atrial fibrillation    Atrial flutter    a. diagnosed in 11/2016 --> started on Eliquis for anticoagulation b. s/p DCCV in 01/2017   CAD (coronary artery disease) 08/02/2007   Celiac disease    Chronic rhinitis 08/02/2007   Coronary artery disease    a. s/p remote stenting of RCA in 1998   Diabetes mellitus type 2 03/23/2012   Essential hypertension 08/02/2007   Gastro-esophageal reflux disease with esophagitis 09/19/2020   History of migraine headaches    MCI (mild cognitive impairment) 09/19/2020   OSA (obstructive sleep apnea)    uses CPAP regularly   Pure hypercholesterolemia 08/02/2007   Secondary hypercoagulable state 07/26/2020     PAST SURGICAL  HISTORY: Past Surgical History:  Procedure Laterality Date   ANKLE ARTHROPLASTY     ATRIAL FIBRILLATION ABLATION N/A 10/24/2020   Procedure: ATRIAL FIBRILLATION ABLATION;  Surgeon: Constance Haw, MD;  Location: [place] CV LAB;  Service: Cardiovascular;  Laterality: N/A;   CARDIAC CATHETERIZATION  06/06/1997   EF 65%   CARDIOVASCULAR STRESS TEST  12/07/2007   EF 58%   CARDIOVERSION N/A 01/08/2017   Procedure: CARDIOVERSION;  Surgeon: Josue Hector, MD;  Location: [place];  Service: Cardiovascular;  Laterality: N/A;   CARDIOVERSION N/A 08/01/2020   Procedure: CARDIOVERSION;  Surgeon: Elouise Munroe, MD;  Location: [place];  Service: Cardiovascular;  Laterality: N/A;   [place]  ELBOW ARTHROPLASTY     EYE SURGERY     KNEE ARTHROPLASTY     NASAL SINUS SURGERY     SHOULDER ACROMIOPLASTY     SPINE SURGERY       FAMILY HISTORY: Family History  Problem Relation Age of Onset   Hypertension Mother    Diabetes Mother    Alzheimer's disease Mother    Stroke Father    Hypertension Father    Hypertension Brother      SOCIAL HISTORY: Social History   Socioeconomic History   Marital status: Married    Spouse name: [name]   Number of children: 0   Years of education: 16   Highest education level: Bachelor's degree (e.g., BA, AB, BS)  Occupational History   Occupation: Retired    Fish farm manager: [employer]    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   Social Determinants of Health   Financial  Resource Strain: Not on file  Food Insecurity: Not on file  Transportation Needs: Not on file  Physical Activity: Not on file  Stress: Not on file  Social Connections: Not on file  Intimate Partner Violence: Not on file     PHYSICAL EXAM  Vitals:   04/24/21 1305  BP: (!) 149/87  Pulse: 63  Weight: 215 lb (97.5 kg)  Height: _0  (1.88 m)   Body mass index is 27.6 kg/m.  Generalized: Well developed, in no acute distress  Cardiology: normal rate and rhythm, no murmur auscultated  Respiratory: clear to auscultation bilaterally    Neurological examination  Mentation: Alert oriented to time, place, history taking. Follows all commands speech and language fluent Cranial nerve II-XII: Pupils were equal round reactive to light. Extraocular movements were full, visual field were full on confrontational test. Facial sensation and strength were normal. Head turning and shoulder shrug  were normal and symmetric. Motor: The motor testing reveals 5 over 5 strength of all 4 extremities. Good symmetric motor tone is noted throughout.  Gait and station: Gait is normal.    DIAGNOSTIC DATA (LABS, IMAGING, TESTING) - I reviewed patient records, labs, notes, testing and imaging myself where available.  Lab Results  Component Value Date   WBC 5.9 10/04/2020   HGB 15.1 10/04/2020   HCT 45.2 10/04/2020   MCV 90 10/04/2020   PLT 350 10/04/2020      Component Value Date/Time   NA 142 10/04/2020 1037   K 4.8 10/04/2020 1037   CL 101 10/04/2020 1037   CO2 25 10/04/2020 1037   GLUCOSE 137 (H) 10/04/2020 1037   GLUCOSE 147 (H) 07/26/2020 1017   BUN 16 10/04/2020 1037   CREATININE 0.92 10/04/2020 1037   CALCIUM 9.8 10/04/2020 1037   PROT 7.1 11/18/2016 1033   ALBUMIN 4.6 11/18/2016 1033   AST 24 11/18/2016 1033   ALT 24 11/18/2016 1033   ALKPHOS 68 11/18/2016 1033   BILITOT 0.3 11/18/2016 1033  GFRNONAA >60 07/26/2020 1017   GFRAA 98 01/05/2017 1543   Lab Results  Component Value  Date   CHOL 136 11/18/2016   HDL 39 (L) 11/18/2016   LDLCALC 54 11/18/2016   TRIG 217 (H) 11/18/2016   CHOLHDL 3.5 11/18/2016   No results found for: HGBA1C No results found for: VITAMINB12 Lab Results  Component Value Date   TSH 1.470 11/18/2016    MMSE - Mini Mental State Exam 04/24/2021 07/16/2020  Orientation to time 0 2  Orientation to Place 4 4  Registration 3 3  Attention/ Calculation 3 5  Recall 0 3  Language- name 2 objects 2 2  Language- repeat 1 1  Language- follow 3 step command 3 3  Language- read & follow direction 0 1  Write a sentence 1 1  Copy design 1 0  Total score 18 25     No flowsheet data found.   ASSESSMENT AND PLAN  65 y.o. year old male  has a past medical history of Arthritis, Atrial fibrillation, Atrial flutter, CAD (coronary artery disease) (08/02/2007), Celiac disease, Chronic rhinitis (08/02/2007), Coronary artery disease, Diabetes mellitus type 2 (03/23/2012), Essential hypertension (08/02/2007), Gastro-esophageal reflux disease with esophagitis (09/19/2020), History of migraine headaches, MCI (mild cognitive impairment) (09/19/2020), OSA (obstructive sleep apnea), Pure hypercholesterolemia (08/02/2007), and Secondary hypercoagulable state (07/26/2020). here with    MCI (mild cognitive impairment)  Michael Hartman continues to note short term memory deficits. Neurocognitive testing concerning for MCI with possible dementia but results variable do to low effort. He tells me today that he does not want to be here and admits that he is much more irritable. I am concerned that anxiety and depression could be contributing. I have discussed option of trying a low dose of escitalopram to see if this helps. He would like to discuss with PCP. I have had a lengthy conversation with his wife regarding medication safety. She will monitor medication administration. She will evaluate quantity of pain medications when she gets home. Safety concerns discussed with Mr and Mrs  Hartman. He does not appear to be having significant difficulty with driving. Although he was unable to answer orientation questions, today, I feel that his lack of desire to be here today contributed to low effort. We will continue to monitor closely. Healthy lifestyle habits encouraged. Memory compensation strategies advised. He will follow up with me in 6 months.    I spent 30 minutes of face-to-face and non-face-to-face time with patient.  This included previsit chart review, lab review, study review, order entry, electronic health record documentation, patient education.    No orders of the defined types were placed in this encounter.    No orders of the defined types were placed in this encounter.     Debbora Presto, MSN, FNP-C 04/24/2021, 2:20 PM  [facility] [address] [place], [place] [address] [phone]

## 2021-04-23 NOTE — Patient Instructions (Signed)
Below is our plan:  We will continue to monitor symptoms. I do suggest consider a medication for anxiety/depression. I feel this may help with your irritability.   Please make sure you are staying well hydrated. I recommend 50-60 ounces daily. Well balanced diet and regular exercise encouraged. Consistent sleep schedule with 6-8 hours recommended.   Please continue follow up with care team as directed.   Follow up with me in 6 months  You may receive a survey regarding today's visit. I encourage you to leave honest feed back as I do use this information to improve patient care. Thank you for seeing me today!   Management of Memory Problems   There are some general things you can do to help manage your memory problems.  Your memory may not in fact recover, but by using techniques and strategies you will be able to manage your memory difficulties better.   1)  Establish a routine. Try to establish and then stick to a regular routine.  By doing this, you will get used to what to expect and you will reduce the need to rely on your memory.  Also, try to do things at the same time of day, such as taking your medication or checking your calendar first thing in the morning. Think about think that you can do as a part of a regular routine and make a list.  Then enter them into a daily planner to remind you.  This will help you establish a routine.   2)  Organize your environment. Organize your environment so that it is uncluttered.  Decrease visual stimulation.  Place everyday items such as keys or cell phone in the same place every day (ie.  Basket next to front door) Use post it notes with a brief message to yourself (ie. Turn off light, lock the door) Use labels to indicate where things go (ie. Which cupboards are for food, dishes, etc.) Keep a notepad and pen by the telephone to take messages   3)  Memory Aids A diary or journal/notebook/daily planner Making a list (shopping list, chore list,  to do list that needs to be done) Using an alarm as a reminder (kitchen timer or cell phone alarm) Using cell phone to store information (Notes, Calendar, Reminders) Calendar/White board placed in a prominent position Post-it notes   In order for memory aids to be useful, you need to have good habits.  It's no good remembering to make a note in your journal if you don't remember to look in it.  Try setting aside a certain time of day to look in journal.   4)  Improving mood and managing fatigue. There may be other factors that contribute to memory difficulties.  Factors, such as anxiety, depression and tiredness can affect memory. Regular gentle exercise can help improve your mood and give you more energy. Simple relaxation techniques may help relieve symptoms of anxiety Try to get back to completing activities or hobbies you enjoyed doing in the past. Learn to pace yourself through activities to decrease fatigue. Find out about some local support groups where you can share experiences with others. Try and achieve 7-8 hours of sleep at night.

## 2021-04-24 ENCOUNTER — Ambulatory Visit (INDEPENDENT_AMBULATORY_CARE_PROVIDER_SITE_OTHER): Payer: Medicare Other | Admitting: Family Medicine

## 2021-04-24 ENCOUNTER — Encounter: Payer: Self-pay | Admitting: Family Medicine

## 2021-04-24 VITALS — BP 149/87 | HR 63 | Ht 74.0 in | Wt 215.0 lb

## 2021-04-24 DIAGNOSIS — G3184 Mild cognitive impairment, so stated: Secondary | ICD-10-CM

## 2021-04-25 ENCOUNTER — Encounter: Payer: Self-pay | Admitting: Family Medicine

## 2021-06-12 ENCOUNTER — Ambulatory Visit: Payer: Medicare Other | Admitting: Cardiology

## 2021-06-25 ENCOUNTER — Encounter: Payer: Self-pay | Admitting: Cardiology

## 2021-06-25 ENCOUNTER — Ambulatory Visit (INDEPENDENT_AMBULATORY_CARE_PROVIDER_SITE_OTHER): Payer: Medicare Other | Admitting: Cardiology

## 2021-06-25 ENCOUNTER — Other Ambulatory Visit: Payer: Self-pay

## 2021-06-25 VITALS — BP 134/80 | HR 56 | Ht 74.0 in | Wt 212.0 lb

## 2021-06-25 DIAGNOSIS — I480 Paroxysmal atrial fibrillation: Secondary | ICD-10-CM | POA: Diagnosis not present

## 2021-06-25 DIAGNOSIS — I483 Typical atrial flutter: Secondary | ICD-10-CM | POA: Diagnosis not present

## 2021-06-25 DIAGNOSIS — G4733 Obstructive sleep apnea (adult) (pediatric): Secondary | ICD-10-CM | POA: Diagnosis not present

## 2021-06-25 NOTE — Patient Instructions (Signed)
Medication Instructions:  °Your physician recommends that you continue on your current medications as directed. Please refer to the Current Medication list given to you today. ° °*If you need a refill on your cardiac medications before your next appointment, please call your pharmacy* ° ° °Lab Work: °None ordered ° ° °Testing/Procedures: °None ordered ° ° °Follow-Up: °At [facility], you and your health needs are our priority.  As part of our continuing mission to provide you with exceptional heart care, we have created designated Provider Care Teams.  These Care Teams include your primary Cardiologist (physician) and Advanced Practice Providers (APPs -  Physician Assistants and Nurse Practitioners) who all work together to provide you with the care you need, when you need it. ° °Your next appointment:   °6 month(s) ° °The format for your next appointment:   °In Person ° °Provider:   °Will Camnitz, MD ° ° ° °Thank you for choosing [facility]!! ° ° °Shalana Jardin, RN °[phone] °  °

## 2021-06-25 NOTE — Progress Notes (Signed)
Electrophysiology Office Note   Date:  06/25/2021   ID:  Michael, Hartman 01-16-1956, MRN [id number]  PCP:  Haywood Pao, MD  Cardiologist:  [place] Primary Electrophysiologist:  Constance Haw, MD    Chief Complaint: AFl   History of Present Illness: Michael Hartman is a 66 y.o. male who is being seen today for the evaluation of atrial flutter at the request of [name], [name], MD. Presenting today for electrophysiology evaluation.  He has a history significant for sleep apnea, hypertension, diabetes, hyperlipidemia, coronary artery disease, celiac disease, atrial fibrillation, atrial flutter.  He is status post atrial fibrillation ablation 10/24/2020.  Today, denies symptoms of palpitations, chest pain, shortness of breath, orthopnea, PND, lower extremity edema, claudication, dizziness, presyncope, syncope, bleeding, or neurologic sequela. The patient is tolerating medications without difficulties.  He has noted no further episodes of his atrial fibrillation.  He is overall happy with his control.  Unfortunately he was diagnosed with mild cognitive impairment.  His wife is quite upset as his memory has worsened.   Past Medical History:  Diagnosis Date   Arthritis    Atrial fibrillation    Atrial flutter    a. diagnosed in 11/2016 --> started on Eliquis for anticoagulation b. s/p DCCV in 01/2017   CAD (coronary artery disease) 08/02/2007   Celiac disease    Chronic rhinitis 08/02/2007   Coronary artery disease    a. s/p remote stenting of RCA in 1998   Diabetes mellitus type 2 03/23/2012   Essential hypertension 08/02/2007   Gastro-esophageal reflux disease with esophagitis 09/19/2020   History of migraine headaches    MCI (mild cognitive impairment) 09/19/2020   OSA (obstructive sleep apnea)    uses CPAP regularly   Pure hypercholesterolemia 08/02/2007   Secondary hypercoagulable state 07/26/2020   Past Surgical History:  Procedure Laterality Date   ANKLE  ARTHROPLASTY     ATRIAL FIBRILLATION ABLATION N/A 10/24/2020   Procedure: ATRIAL FIBRILLATION ABLATION;  Surgeon: Constance Haw, MD;  Location: [facility];  Service: Cardiovascular;  Laterality: N/A;   CARDIAC CATHETERIZATION  06/06/1997   EF 65%   CARDIOVASCULAR STRESS TEST  12/07/2007   EF 58%   CARDIOVERSION N/A 01/08/2017   Procedure: CARDIOVERSION;  Surgeon: Josue Hector, MD;  Location: [facility];  Service: Cardiovascular;  Laterality: N/A;   CARDIOVERSION N/A 08/01/2020   Procedure: CARDIOVERSION;  Surgeon: Elouise Munroe, MD;  Location: [facility];  Service: Cardiovascular;  Laterality: N/A;   CORONARY STENT PLACEMENT     ELBOW ARTHROPLASTY     EYE SURGERY     KNEE ARTHROPLASTY     NASAL SINUS SURGERY     SHOULDER ACROMIOPLASTY     SPINE SURGERY       Current Outpatient Medications  Medication Sig Dispense Refill   acetaminophen (TYLENOL) 500 MG tablet Take 1,000 mg by mouth every 6 (six) hours as needed for mild pain or headache.     atorvastatin (LIPITOR) 80 MG tablet TAKE 1 TABLET BY MOUTH  DAILY 90 tablet 0   diltiazem (CARDIZEM CD) 180 MG 24 hr capsule Take 180 mg by mouth daily.     docusate sodium (COLACE) 100 MG capsule Take 100 mg by mouth in the morning.     ELIQUIS 5 MG TABS tablet TAKE 1 TABLET BY MOUTH  TWICE DAILY 180 tablet 3   FARXIGA 10 MG TABS tablet Take 10 mg by mouth in the morning.  fluticasone (FLONASE) 50 MCG/ACT nasal spray Place 1 spray into both nostrils at bedtime.     metFORMIN (GLUCOPHAGE) 1000 MG tablet Take 1,000 mg by mouth 2 (two) times daily.     methocarbamol (ROBAXIN) 500 MG tablet Take 500 mg by mouth 2 (two) times daily as needed for muscle spasms.     metoprolol succinate (TOPROL-XL) 100 MG 24 hr tablet Take 1 tablet (100 mg total) by mouth daily. Take with or immediately following a meal. 90 tablet 1   mirtazapine (REMERON) 45 MG tablet Take 45 mg by mouth at bedtime.     morphine (KADIAN) 30 MG 24 hr capsule  Take 30 mg by mouth every 12 (twelve) hours.     Multiple Vitamin (MULTIVITAMIN WITH MINERALS) TABS tablet Take 1 tablet by mouth in the morning.     nitroGLYCERIN (NITROSTAT) 0.4 MG SL tablet Place 1 tablet (0.4 mg total) under the tongue every 5 (five) minutes as needed for chest pain. 25 tablet 11   Olmesartan-amLODIPine-HCTZ 40-5-12.5 MG TABS Take 1 tablet by mouth daily.     ONE TOUCH ULTRA TEST test strip 1 each by Other route as needed. Use 1 strip to check glucose daily 100 each 4   oxyCODONE-acetaminophen (PERCOCET) 10-325 MG per tablet Take 1 tablet by mouth every 6 (six) hours as needed for pain.     tiZANidine (ZANAFLEX) 2 MG tablet Take 2 mg by mouth 3 (three) times daily as needed.     TRADJENTA 5 MG TABS tablet Take 5 mg by mouth in the morning.     metFORMIN (GLUCOPHAGE) 500 MG tablet Take 500 mg by mouth 2 (two) times daily. (Patient not taking: Reported on 06/25/2021)     No current facility-administered medications for this visit.    Allergies:   Ambien [zolpidem tartrate], Amoxicillin, and Penicillins   Social History:  The patient  reports that he quit smoking about 36 years ago. His smoking use included cigarettes. He has a 80.00 pack-year smoking history. He has quit using smokeless tobacco. He reports that he does not currently use drugs. He reports that he does not drink alcohol.   Family History:  The patient's family history includes Alzheimer's disease in his mother; Diabetes in his mother; Hypertension in his brother, father, and mother; Stroke in his father.   ROS:  Please see the history of present illness.   Otherwise, review of systems is positive for none.   All other systems are reviewed and negative.   PHYSICAL EXAM: VS:  BP 134/80    Pulse (!) 56    Ht 6\' 2"  (1.88 m)    Wt 212 lb (96.2 kg)    SpO2 97%    BMI 27.22 kg/m  , BMI Body mass index is 27.22 kg/m. GEN: Well nourished, well developed, in no acute distress  HEENT: normal  Neck: no JVD, carotid  bruits, or masses Cardiac: RRR; no murmurs, rubs, or gallops,no edema  Respiratory:  clear to auscultation bilaterally, normal work of breathing GI: soft, nontender, nondistended, + BS MS: no deformity or atrophy  Skin: warm and dry Neuro:  Strength and sensation are intact Psych: euthymic mood, full affect  EKG:  EKG is ordered today. Personal review of the ekg ordered shows sinus rhytym   Recent Labs: 10/04/2020: BUN 16; Creatinine, Ser 0.92; Hemoglobin 15.1; Platelets 350; Potassium 4.8; Sodium 142    Lipid Panel     Component Value Date/Time   CHOL 136 11/18/2016 1030   TRIG  217 (H) 11/18/2016 1030   HDL 39 (L) 11/18/2016 1030   CHOLHDL 3.5 11/18/2016 1030   LDLCALC 54 11/18/2016 1030     Wt Readings from Last 3 Encounters:  06/25/21 212 lb (96.2 kg)  04/24/21 215 lb (97.5 kg)  03/18/21 215 lb (97.5 kg)      Other studies Reviewed: Additional studies/ records that were reviewed today include: TTE 08/10/20  Review of the above records today demonstrates:   1. Left ventricular ejection fraction, by estimation, is 60 to 65%. The  left ventricle has normal function. The left ventricle has no regional  wall motion abnormalities. Left ventricular diastolic parameters are  indeterminate.   2. Right ventricular systolic function is normal. The right ventricular  size is mildly enlarged. Tricuspid regurgitation signal is inadequate for  assessing PA pressure.   3. Left atrial size was moderately dilated.   4. Right atrial size was moderately dilated.   5. The mitral valve is normal in structure. Trivial mitral valve  regurgitation. No evidence of mitral stenosis.   6. The aortic valve is tricuspid. Aortic valve regurgitation is not  visualized. No aortic stenosis is present.   7. The inferior vena cava is normal in size with greater than 50%  respiratory variability, suggesting right atrial pressure of 3 mmHg.    ASSESSMENT AND PLAN:  1.  Paroxysmal atrial  fibrillation/typical atrial flutter: CHA2DS2-VASc of 4.  Currently on Eliquis 5 mg twice daily.  Status post ablation 10/24/2020.  Remains in sinus rhythm.  He has had no further episodes and is overall happy with his control.  Continue with current management.  2.  Obstructive sleep apnea: CPAP compliance encouraged  3.  Hypertension: Currently well controlled  4.  Coronary artery disease: No current chest pain.  Plan per primary cardiology.   Current medicines are reviewed at length with the patient today.   The patient does not have concerns regarding his medicines.  The following changes were made today: None  Labs/ tests ordered today include:  Orders Placed This Encounter  Procedures   EKG 12-Lead    Disposition:   FU with Rashelle Ireland 6 months  Signed, Kalinda Romaniello Meredith Leeds, MD  06/25/2021 11:09 AM     [address] [place] [address] [phone] (office) [phone] (fax)

## 2021-07-07 ENCOUNTER — Other Ambulatory Visit: Payer: Self-pay | Admitting: Cardiology

## 2021-07-07 DIAGNOSIS — I480 Paroxysmal atrial fibrillation: Secondary | ICD-10-CM

## 2021-07-08 NOTE — Telephone Encounter (Signed)
Prescription refill request for Eliquis received. Indication:Afib Last office visit:1/23 Scr:0.9 Age: 66 Weight:96.2 kg  Prescription refilled

## 2021-09-06 ENCOUNTER — Encounter: Payer: Self-pay | Admitting: Family Medicine

## 2021-09-12 ENCOUNTER — Telehealth: Payer: Self-pay | Admitting: Neurology

## 2021-09-12 ENCOUNTER — Ambulatory Visit (INDEPENDENT_AMBULATORY_CARE_PROVIDER_SITE_OTHER): Payer: Medicare Other | Admitting: Neurology

## 2021-09-12 VITALS — BP 144/82 | HR 61 | Ht 74.0 in | Wt 206.8 lb

## 2021-09-12 DIAGNOSIS — F22 Delusional disorders: Secondary | ICD-10-CM | POA: Diagnosis not present

## 2021-09-12 DIAGNOSIS — R413 Other amnesia: Secondary | ICD-10-CM

## 2021-09-12 DIAGNOSIS — Z79899 Other long term (current) drug therapy: Secondary | ICD-10-CM

## 2021-09-12 NOTE — Telephone Encounter (Signed)
[employer] medicare no auth req order sent to [facility] to be scheduled. They will reach out to the patient to schedule.  ?

## 2021-09-12 NOTE — Patient Instructions (Signed)
It was nice to see you both today.  ?I recommend you talk to Dr. Osborne Casco about your mood including depressive symptoms, anxiety and irritability. He may want you to see a psychiatrist.  ?You declined a referral to psychiatry today. I recommend you let your wife go with you for your next pain clinic follow-up appointment. You are taking several medications that can cause or exacerbate hallucinations and delusions.  ?Please be fully compliant with your CPAP.  ?We are not starting any medication for memory loss yet, as I do recommend you address these other issues first. Memory medications may cause increase in hallucinations.  ?We will pursue a brain scan, called MRI and call you with the test results. We will have to schedule you for this on a separate date. This test requires authorization from your insurance, and we will take care of the insurance process. ?I will call in some xanax for you to take for the MRI due to claustrophobia.  ?We will do an EEG (brainwave test), which we will schedule. We will call you with the results. ? ?

## 2021-09-12 NOTE — Progress Notes (Signed)
Subjective:  ?  ?Patient ID: Michael Hartman is a 66 y.o. male. ? ?HPI ? ? ? ?Interim history:  ? ?Michael Hartman is a 66 year old right-handed gentleman with an underlying complex medical history of coronary artery disease with status post stenting, paroxysmal A. fib with status post cardioversion in 2018, chronic rhinitis, reflux disease, diabetes, migraine headaches, sleep apnea, on CPAP therapy, hyperlipidemia, hypertension, allergies, arthritis, low back pain, on chronic narcotic pain medication, and overweight state, who presents for follow-up consultation of his memory loss. The patient is accompanied by his wife today. I last saw him on 10/16/2020, at which time we discussed his evaluation with neuropsychology. He had a cardioversion on 08/01/2020, but did not stay in sinus rhythm and was found to be in A. fib.  He is saw cardiology on 08/28/2020 and was then scheduled to have an ablation.  He reported compliance with his CPAP, and follows with Michael Hartman.  ?As he was not able to go through with a brain MRI, I suggested we proceed with a CT head without contrast. ? ?He has a [place] wo contrast on 11/12/20, and I reviewed the results: IMPRESSION: This CT scan of the head without contrast shows the following: ?1.   Minimal generalized cortical atrophy, normal for age.Marland Kitchen ?2.   Brain parenchyma appears normal for age. ?3.   Calcification within the vertebral and internal carotid arteries ?4.   No acute findings. ?  ?His wife was notified by phone call. ? ?He saw Michael Presto, NP on 04/24/21, at which time his MMSE was 18 out of 30, he reported more mood related issues including anxiety and depression and irritability.  He was advised to follow-up with primary care regarding management of these.  There was some concern about adherence to his pain medication regimen correctly.  His wife emailed in the interim a few times reporting ongoing concern with patient not taking his narcotic pain medication correctly.  ? ? ?Today, 09/12/21:  He reports that he does not want to be here today and that he is only here because his wife made him come.  He has no new concerns, he denies any discrepancies with his narcotic pain medication and he does not wish to pursue an MRI but he voided if his wife insists.  He would be willing to do any imaging of his wife wants it to be done.  He repeatedly states "I don't care".  Indicates that he does not wish for his wife to go up with him when he has a pain clinic follow-up.  She reports that she is not fully sure how he takes his medication as he does it on his own, sometimes he takes medication out of the medication bottle and may put it in a different bottle.  She had previously indicated that he may request refills on his pain medication early before they are due .  He had A fib ablation in May 2022. He is followed by cardiology on a regular basis.  For the past week or so he has had delusions including sister, she reports that he does not even have a sister.  She reports that primary care did not offer any additional recommendation for mood disorder.  Patient declines a referral to psychiatry today.  She wonders if he can have his brain MRI now.  He appears to be indifferent to it but would be willing to pursue it with light sedation due to his claustrophobia.  She indicates that even if we  were to start medication, she is not sure that he would come back for follow-up here.  I explained to her that memory medication can exacerbate hallucinations, if he has evidence of hallucinations, it is certainly a higher risk.  In addition, he takes several MEDICATIONS including Robaxin, Remeron, long-acting morphine sulfate, oxycodone, and Zanaflex. ? ?Previously:  ? ?I first met him at the request of his primary care physician on 07/16/2020, at which time his wife reported concern about his short-term memory for the past 2 to 3 years.  He was repeating himself, forgetting conversations and asking the same question over  again.  He had blood work through his primary care physician.  His MMSE during her first office visit was 26 out of 30.  He was advised to proceed with a brain MRI and he was advised to proceed with neuropsychological evaluation.  He was found to have an irregular heartbeat.  I suggested sooner than scheduled follow-up with his cardiologist. ?  ?He saw Dr. Melvyn Novas on 09/19/2020 and had a subsequent follow-up appointment for discussion on 09/25/2020.  I reviewed the impression and recommendations:  ?  ?<<Clinical Impression(s): ?Scores across stand-alone and embedded performance validity measures were largely below expectation. Additionally, test engagement was poor at times, requiring encouragement from the psychometrist. Michael Hartman commonly stated "I'm blank" or "I'm shutting down and I know it" during testing, often before his participation in a task was discontinued. This certainly could represent increased anxiety and/or frustration surrounding being faced with poor performance across testing which could impact his ability to fully engage. He also exhibited some odd performances, including makes errors when naming colors or reading basic words such as "red" or "blue," which is quite uncommon even in individuals with significant cognitive dysfunction. Overall, as there are some validity concerns surrounding test results, the current evaluation should be interpreted with caution as lower performances have the potential to underestimate true cognitive abilities. ?  ?If taken at face value, Michael Hartman pattern of performance is suggestive of prominent impairment surrounding all aspects of learning and memory. Impairments were also exhibited across executive functioning and verbal fluency, while more relative weaknesses were exhibited across working memory and receptive language. Processing speed was variable while performance was appropriate across basic attention, confrontation naming, and visuospatial abilities. Mr.  Hartman denied difficulties completing instrumental activities of daily living (ADLs) independently; this was independently confirmed by his wife.  ?  ?While validity concerns make understanding the true extent of cognitive impairment unable to be determined, I do believe that Mr. Honeyman exhibits appreciable cognitive impairment in several domains. This, coupled with reportedly intact ADLs, would suggest he meets diagnostic criteria for a Mild Neurocognitive Disorder ("mild cognitive impairment") at the present time. There is a high likelihood of a significant vascular component to his current presentation given numerous vascular-related medical conditions, especially atrial fibrillation. Additionally, medication side effects, especially muscle relaxants and those related to pain management, could certainly worsen vascular-related deficits. Given test validity concerns, I cannot rule out the presence of an underlying neurodegenerative disease at the present time. In fact, if test performances were taken at face value, Mr. Gloster amnestic memory profile with evidence for memory storage impairment would certainly raise concerns for the presence of Alzheimer's disease. However, future repeat testing will be necessary to obtain more firm diagnostic conclusions. Continued medical monitoring will be important moving forward. ?  ?Recommendations: ?A repeat neuropsychological evaluation in 18-24 months (or sooner if functional decline is noted) is recommended to assess  the trajectory of future cognitive decline should it occur. This will also aid in future efforts towards improved diagnostic clarity. ?  ?Neuroimaging would be beneficial. His wife noted that he attempted to complete a brain MRI but experienced symptoms of claustrophobia and ultimately could not go through with the scan. If he is up for it, he could attempt to undergo a brain MRI while sedated. If not, even the completion of a head CT would be beneficial.  ?   ?Appropriate management of cardiovascular conditions, especially atrial fibrillation, will be important in slowing future cognitive decline and lessening his future stroke risk. Ensuring that he continue

## 2021-09-13 ENCOUNTER — Encounter: Payer: Self-pay | Admitting: Neurology

## 2021-09-16 MED ORDER — ALPRAZOLAM 0.5 MG PO TABS: ORAL_TABLET | ORAL | 0 refills | Status: DC

## 2021-09-24 ENCOUNTER — Ambulatory Visit (INDEPENDENT_AMBULATORY_CARE_PROVIDER_SITE_OTHER): Payer: Medicare Other | Admitting: Neurology

## 2021-09-24 DIAGNOSIS — F22 Delusional disorders: Secondary | ICD-10-CM

## 2021-09-24 DIAGNOSIS — R410 Disorientation, unspecified: Secondary | ICD-10-CM

## 2021-09-24 DIAGNOSIS — Z79899 Other long term (current) drug therapy: Secondary | ICD-10-CM

## 2021-09-24 DIAGNOSIS — R413 Other amnesia: Secondary | ICD-10-CM

## 2021-09-24 NOTE — Procedures (Signed)
? ? ?  History: ? ?66 year old man with memory loss  ? ?EEG classification: Awake and drowsy ? ?Description of the recording: The background rhythms of this recording consists of a fairly well modulated medium amplitude theta rhythm of 6-8 Hz that is reactive to eye opening and closure. As the record progresses, the patient appears to remain in the waking state throughout the recording. Photic stimulation was performed, did not show any abnormalities. Hyperventilation was also performed, did not show any abnormalities. Toward the end of the recording, the patient enters the drowsy state with slight symmetric slowing seen. The patient never enters stage II sleep. No abnormal epileptiform discharges seen during this recording. There was no focal slowing. EKG monitor shows no evidence of cardiac rhythm abnormalities with a heart rate of 60. ? ?Abnormality: Mild diffuse slowing   ? ?Impression: This is an abnormal EEG recording in the waking and drowsy state due to mild diffuse slowing. Diffuse slowing is consistent with a generalized brain dysfunction, nonspecific.  ? ? ?Alric Ran, MD ?[facility] ?  ?

## 2021-09-26 ENCOUNTER — Other Ambulatory Visit: Payer: Self-pay | Admitting: Neurology

## 2021-09-27 ENCOUNTER — Ambulatory Visit (HOSPITAL_COMMUNITY): Admission: RE | Admit: 2021-09-27 | Payer: Medicare Other | Source: Ambulatory Visit

## 2021-10-06 NOTE — Progress Notes (Deleted)
Cardiology Office Note    Date:  10/06/2021   ID:  Michael Hartman 1956-03-08, MRN [id number]  PCP:  Haywood Pao, MD  Cardiologist: Dr. Martinique   No chief complaint on file.   History of Present Illness:    Michael Hartman is a 66 y.o. male with past medical history of CAD (s/p stenting of RCA in 1998), PAF (on Eliquis), HTN, HLD, Type 2 DM, and OSA (on CPAP) who presents to the office today for follow-up of atrial fibrillation and CAD.   He was diagnosed with Afib in June of 2018. Despite good rate control he remained symptomatic with fatigue.  He underwent DCCV x2 with 150J and 200J with conversion to NSR in August 2018.   He was found to be back in atrial flutter 07/16/2020.  He had increased fatigue.  He is now status post cardioversion 08/01/2020. He was seen by Dr Curt Bears on March 23 and was back in Afib. He subsequently underwent Afib ablation on 10/24/20.   He denies any chest pain, palpitations, dyspnea or dizziness. He states that after his cardioversion he did feel better but isn't really aware of his AFib now.  He has lost 30 lbs over the past 2 years.    Past Medical History:  Diagnosis Date   Arthritis    Atrial fibrillation    Atrial flutter    a. diagnosed in 11/2016 --> started on Eliquis for anticoagulation b. s/p DCCV in 01/2017   CAD (coronary artery disease) 08/02/2007   Celiac disease    Chronic rhinitis 08/02/2007   Coronary artery disease    a. s/p remote stenting of RCA in 1998   Diabetes mellitus type 2 03/23/2012   Essential hypertension 08/02/2007   Gastro-esophageal reflux disease with esophagitis 09/19/2020   History of migraine headaches    MCI (mild cognitive impairment) 09/19/2020   OSA (obstructive sleep apnea)    uses CPAP regularly   Pure hypercholesterolemia 08/02/2007   Secondary hypercoagulable state 07/26/2020    Past Surgical History:  Procedure Laterality Date   ANKLE ARTHROPLASTY     ATRIAL FIBRILLATION ABLATION N/A  10/24/2020   Procedure: ATRIAL FIBRILLATION ABLATION;  Surgeon: Constance Haw, MD;  Location: [facility];  Service: Cardiovascular;  Laterality: N/A;   CARDIAC CATHETERIZATION  06/06/1997   EF 65%   CARDIOVASCULAR STRESS TEST  12/07/2007   EF 58%   CARDIOVERSION N/A 01/08/2017   Procedure: CARDIOVERSION;  Surgeon: Josue Hector, MD;  Location: [facility];  Service: Cardiovascular;  Laterality: N/A;   CARDIOVERSION N/A 08/01/2020   Procedure: CARDIOVERSION;  Surgeon: Elouise Munroe, MD;  Location: [facility];  Service: Cardiovascular;  Laterality: N/A;   CORONARY STENT PLACEMENT     ELBOW ARTHROPLASTY     EYE SURGERY     KNEE ARTHROPLASTY     NASAL SINUS SURGERY     SHOULDER ACROMIOPLASTY     SPINE SURGERY      Current Medications: Outpatient Medications Prior to Visit  Medication Sig Dispense Refill   acetaminophen (TYLENOL) 500 MG tablet Take 1,000 mg by mouth every 6 (six) hours as needed for mild pain or headache.     ALPRAZolam (XANAX) 0.5 MG tablet Take one to 2 tablets 1 hour prior to MRI, may take another as needed. 3 tablet 0   atorvastatin (LIPITOR) 80 MG tablet TAKE 1 TABLET BY MOUTH  DAILY 90 tablet 0   diltiazem (CARDIZEM CD) 180 MG 24 hr capsule  Take 180 mg by mouth daily.     docusate sodium (COLACE) 100 MG capsule Take 100 mg by mouth in the morning.     ELIQUIS 5 MG TABS tablet TAKE 1 TABLET BY MOUTH  TWICE DAILY 180 tablet 3   FARXIGA 10 MG TABS tablet Take 10 mg by mouth in the morning.     fluticasone (FLONASE) 50 MCG/ACT nasal spray Place 1 spray into both nostrils at bedtime.     metFORMIN (GLUCOPHAGE) 1000 MG tablet Take 1,000 mg by mouth 2 (two) times daily.     methocarbamol (ROBAXIN) 500 MG tablet Take 500 mg by mouth 2 (two) times daily as needed for muscle spasms.     metoprolol succinate (TOPROL-XL) 100 MG 24 hr tablet Take 1 tablet (100 mg total) by mouth daily. Take with or immediately following a meal. 90 tablet 1   mirtazapine  (REMERON) 45 MG tablet Take 45 mg by mouth at bedtime.     morphine (KADIAN) 30 MG 24 hr capsule Take 30 mg by mouth every 12 (twelve) hours.     Multiple Vitamin (MULTIVITAMIN WITH MINERALS) TABS tablet Take 1 tablet by mouth in the morning.     nitroGLYCERIN (NITROSTAT) 0.4 MG SL tablet Place 1 tablet (0.4 mg total) under the tongue every 5 (five) minutes as needed for chest pain. 25 tablet 11   Olmesartan-amLODIPine-HCTZ 40-5-12.5 MG TABS Take 1 tablet by mouth daily.     ONE TOUCH ULTRA TEST test strip 1 each by Other route as needed. Use 1 strip to check glucose daily 100 each 4   oxyCODONE-acetaminophen (PERCOCET) 10-325 MG per tablet Take 1 tablet by mouth every 6 (six) hours as needed for pain.     tiZANidine (ZANAFLEX) 2 MG tablet Take 2 mg by mouth 3 (three) times daily as needed.     TRADJENTA 5 MG TABS tablet Take 5 mg by mouth in the morning.     No facility-administered medications prior to visit.     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   Social Determinants of Health   Financial Resource Strain: Not on file  Food Insecurity: Not on file  Transportation Needs: Not on file   Physical Activity: Not on file  Stress: Not on file  Social Connections: Not on file     Family History:  The patient's family history includes Diabetes in his mother; Hypertension in his brother, father, and mother; Stroke in his father.   Review of Systems:   Please see the history of  present illness.    All other systems reviewed and are otherwise negative except as noted above.   Physical Exam:    VS:  There were no vitals taken for this visit.   GENERAL:  Well appearing WM in NAD HEENT:  PERRL, EOMI, sclera are clear. Oropharynx is clear. NECK:  No jugular venous distention, carotid upstroke brisk and symmetric, no bruits, no thyromegaly or adenopathy LUNGS:  Clear to auscultation bilaterally CHEST:  Unremarkable HEART:  IRRR,  PMI not displaced or sustained,S1 and S2 within normal limits, no S3, no S4: no clicks, no rubs, no murmurs ABD:  Soft, nontender. BS +, no masses or bruits. No hepatomegaly, no splenomegaly EXT:  2 + pulses throughout, no edema, no cyanosis no clubbing SKIN:  Warm and dry.  No rashes NEURO:  Alert and oriented x 3. Cranial nerves II through XII intact. PSYCH:  Cognitively intact    Wt Readings from Last 3 Encounters:  09/12/21 206 lb 12.8 oz (93.8 kg)  06/25/21 212 lb (96.2 kg)  04/24/21 215 lb (97.5 kg)     Studies/Labs Reviewed:   Recent Labs: No results found for requested labs within last 8760 hours.   Lipid Panel    Component Value Date/Time   CHOL 136 11/18/2016 1030   TRIG 217 (H) 11/18/2016 1030   HDL 39 (L) 11/18/2016 1030   CHOLHDL 3.5 11/18/2016 1030   LDLCALC 54 11/18/2016 1030   Dated 03/31/17: A1c 7.8%. Hgb and chemistries normal. Dated 09/29/17: glucose 117, otherwise CBC, CMET and PSA normal. Cholesterol 140, triglycerides 172, HDL 37, LDL 69. Dated 04/14/18: A1c 6.4%. cholesterol 140, Triglycerides 172, HDL 37, LDL 69. Glucose 131, creatinine 1.1. other chemistries, TSH, CBC normal Dated 10/06/18: cholesterol 142,  triglycerides 105, HDL 39, LDL 82. A1c 6.6%. CMET and CBC normal.  Dated 10/21/19: cholesterol 116, triglycerides 158, HDL 42, LDL 42.  Dated 04/26/20: A1c 6.6%. CMET normal   Additional studies/ records that were reviewed today include:   Echocardiogram: 11/24/2016 Study Conclusions   - Left ventricle: The cavity size was mildly dilated. There was   mild focal basal hypertrophy of the septum. Systolic function was   normal. The estimated ejection fraction was 55%. Wall motion was   normal; there were no regional wall motion abnormalities. - Aortic valve: Trileaflet; mildly thickened, mildly calcified   leaflets. - Mitral valve: Calcified annulus. - Left atrium: The atrium was moderately dilated. - Right ventricle: The cavity size was mildly dilated. Wall   thickness was normal. - Pulmonic valve: There was trivial regurgitation.  Echo 08/10/20: 1. Left ventricular ejection fraction, by estimation, is 60 to 65%. The  left ventricle has normal function. The left ventricle has no regional  wall motion abnormalities. Left ventricular diastolic parameters are  indeterminate.   2. Right ventricular systolic function is normal. The right ventricular  size is mildly enlarged. Tricuspid regurgitation signal is inadequate for  assessing PA pressure.   3. Left atrial size was moderately dilated.   4. Right atrial size was moderately dilated.   5. The mitral valve is normal in structure. Trivial mitral valve  regurgitation. No evidence of mitral stenosis.   6. The aortic valve is tricuspid. Aortic valve regurgitation is not  visualized. No aortic stenosis is present.   7. The inferior vena cava is normal in size with greater than 50%  respiratory variability, suggesting right atrial pressure of 3 mmHg.   Assessment:    No diagnosis found.    Plan:  1. Paroxysmal Atrial Fibrillation/ Use of Long-term Anticoagulation - initially diagnosed with atrial fibrillation back in 11/2016 and  is  s/p DCCV on 01/08/2017. - Recurrent Afib now s/p repeat DCCV in Feb 2022. - early recurrence of Afib. -He is status post atrial fibrillation ablation 10/24/2020. -  Continue Eliquis 5mg  BID for anticoagulation.   2. CAD - s/p remote stenting of RCA in 1998. - he is asymptomatic.  - continue BB and statin therapy.   3. HTN - BP is  well controlled.  4. HLD - at goal on lipitor.  - continue Atorvastatin 80mg  daily.   5. OSA - continue CPAP.  - encouraged with his weight loss.    Signed, Michael Nix Martinique, MD,FACC 10/06/2021 2:45 PM    [facility] [address] [place], [place] [address] Phone: [phone]

## 2021-10-14 ENCOUNTER — Encounter: Payer: Self-pay | Admitting: Neurology

## 2021-10-14 ENCOUNTER — Ambulatory Visit (HOSPITAL_COMMUNITY)
Admission: RE | Admit: 2021-10-14 | Discharge: 2021-10-14 | Disposition: A | Discharge status: Home / Self Care | Payer: Medicare Other | Source: Ambulatory Visit | Attending: Neurology | Admitting: Neurology

## 2021-10-14 DIAGNOSIS — R413 Other amnesia: Secondary | ICD-10-CM | POA: Diagnosis present

## 2021-10-14 DIAGNOSIS — F22 Delusional disorders: Secondary | ICD-10-CM | POA: Insufficient documentation

## 2021-10-14 DIAGNOSIS — Z79899 Other long term (current) drug therapy: Secondary | ICD-10-CM | POA: Diagnosis present

## 2021-10-14 MED ORDER — GADOBUTROL 1 MMOL/ML IV SOLN
9.0000 mL | Freq: Once | INTRAVENOUS | Status: AC | PRN
  Administered 2021-10-14: 9 mL via INTRAVENOUS

## 2021-10-15 ENCOUNTER — Emergency Department (HOSPITAL_COMMUNITY)
Admission: EM | Admit: 2021-10-15 | Discharge: 2021-10-23 | Disposition: A | Discharge status: Other Acute Inpatient Hospital | Payer: Medicare Other | Attending: Emergency Medicine | Admitting: Emergency Medicine

## 2021-10-15 ENCOUNTER — Encounter (HOSPITAL_COMMUNITY): Payer: Self-pay

## 2021-10-15 ENCOUNTER — Other Ambulatory Visit: Payer: Self-pay

## 2021-10-15 ENCOUNTER — Telehealth: Payer: Self-pay

## 2021-10-15 DIAGNOSIS — R410 Disorientation, unspecified: Secondary | ICD-10-CM | POA: Diagnosis present

## 2021-10-15 DIAGNOSIS — R441 Visual hallucinations: Secondary | ICD-10-CM

## 2021-10-15 DIAGNOSIS — Z7901 Long term (current) use of anticoagulants: Secondary | ICD-10-CM | POA: Insufficient documentation

## 2021-10-15 DIAGNOSIS — G3184 Mild cognitive impairment, so stated: Secondary | ICD-10-CM | POA: Insufficient documentation

## 2021-10-15 DIAGNOSIS — F29 Unspecified psychosis not due to a substance or known physiological condition: Secondary | ICD-10-CM | POA: Diagnosis not present

## 2021-10-15 DIAGNOSIS — Z79899 Other long term (current) drug therapy: Secondary | ICD-10-CM | POA: Insufficient documentation

## 2021-10-15 DIAGNOSIS — F0390 Unspecified dementia without behavioral disturbance: Secondary | ICD-10-CM

## 2021-10-15 LAB — AMMONIA: Ammonia: 32 umol/L (ref 9–35)

## 2021-10-15 LAB — COMPREHENSIVE METABOLIC PANEL
ALT: 21 U/L (ref 0–44)
AST: 26 U/L (ref 15–41)
Albumin: 3.8 g/dL (ref 3.5–5.0)
Alkaline Phosphatase: 52 U/L (ref 38–126)
Anion gap: 6 (ref 5–15)
BUN: 17 mg/dL (ref 8–23)
CO2: 27 mmol/L (ref 22–32)
Calcium: 9.3 mg/dL (ref 8.9–10.3)
Chloride: 110 mmol/L (ref 98–111)
Creatinine, Ser: 0.95 mg/dL (ref 0.61–1.24)
GFR, Estimated: >60 mL/min (ref >60)
Glucose, Bld: 130 mg/dL — ABNORMAL HIGH (ref 70–99)
Potassium: 3.7 mmol/L (ref 3.5–5.1)
Sodium: 143 mmol/L (ref 135–145)
Total Bilirubin: 1.3 mg/dL — ABNORMAL HIGH (ref 0.3–1.2)
Total Protein: 6.4 g/dL — ABNORMAL LOW (ref 6.5–8.1)

## 2021-10-15 LAB — URINALYSIS, ROUTINE W REFLEX MICROSCOPIC
Bacteria, UA: NONE SEEN
Bilirubin Urine: NEGATIVE
Glucose, UA: NEGATIVE mg/dL
Ketones, ur: NEGATIVE mg/dL
Leukocytes,Ua: NEGATIVE
Nitrite: NEGATIVE
Protein, ur: 100 mg/dL — AB
Specific Gravity, Urine: 1.02 (ref 1.005–1.030)
pH: 5 (ref 5.0–8.0)

## 2021-10-15 LAB — ETHANOL: Alcohol, Ethyl (B): <10 mg/dL (ref <10)

## 2021-10-15 LAB — CBC WITH DIFFERENTIAL/PLATELET
Abs Immature Granulocytes: 0.02 10*3/uL (ref 0.00–0.07)
Basophils Absolute: 0 10*3/uL (ref 0.0–0.1)
Basophils Relative: 1 %
Eosinophils Absolute: 0.1 10*3/uL (ref 0.0–0.5)
Eosinophils Relative: 3 %
HCT: 37.6 % — ABNORMAL LOW (ref 39.0–52.0)
Hemoglobin: 12.3 g/dL — ABNORMAL LOW (ref 13.0–17.0)
Immature Granulocytes: 1 %
Lymphocytes Relative: 28 %
Lymphs Abs: 1.2 10*3/uL (ref 0.7–4.0)
MCH: 30.1 pg (ref 26.0–34.0)
MCHC: 32.7 g/dL (ref 30.0–36.0)
MCV: 92.2 fL (ref 80.0–100.0)
Monocytes Absolute: 0.4 10*3/uL (ref 0.1–1.0)
Monocytes Relative: 10 %
Neutro Abs: 2.5 10*3/uL (ref 1.7–7.7)
Neutrophils Relative %: 57 %
Platelets: 236 10*3/uL (ref 150–400)
RBC: 4.08 MIL/uL — ABNORMAL LOW (ref 4.22–5.81)
RDW: 13.2 % (ref 11.5–15.5)
WBC: 4.3 10*3/uL (ref 4.0–10.5)
nRBC: 0 % (ref 0.0–0.2)

## 2021-10-15 LAB — TSH: TSH: 1.479 u[IU]/mL (ref 0.350–4.500)

## 2021-10-15 LAB — TROPONIN I (HIGH SENSITIVITY)
Troponin I (High Sensitivity): 6 ng/L (ref <18)
Troponin I (High Sensitivity): 6 ng/L (ref <18)

## 2021-10-15 LAB — RAPID URINE DRUG SCREEN, HOSP PERFORMED
Amphetamines: NOT DETECTED
Barbiturates: NOT DETECTED
Benzodiazepines: POSITIVE — AB
Cocaine: NOT DETECTED
Opiates: POSITIVE — AB
Tetrahydrocannabinol: NOT DETECTED

## 2021-10-15 NOTE — ED Provider Notes (Signed)
?[facility] ?Provider Note ? ? ?CSN: [id number] ?Arrival date & time: 10/15/21  1905 ? ?  ? ?History ? ?Chief Complaint  ?Patient presents with  ? Altered Mental Status  ? ? ?Michael Hartman is a 66 y.o. male. ? ?Patient is a 66 yo male with pmh of afib, osa, mild cognitive impairment, cardiac angiography presenting with wife for visual hallucinations hallucinations. Patient's wife states patient has been seeing people outside the home that was not there about a month ago. States hallucinations resolved then started again this weekend. States "he asks me who is sitting on the cough next to me. Where did all the people go? Stating my dead father spoke to him when he is deceased". Admits to increased agitation and confusion at night.  ? ?Chart review demonstrates neurologist ordered MRI yesterday that demonstrates: ?No acute process. Minimal chronic small vessel ischemic disease and mild cerebral atrophy. ? ?The history is provided by the patient. No language interpreter was used.  ?Altered Mental Status ?Presenting symptoms: confusion   ?Associated symptoms: agitation and hallucinations   ?Associated symptoms: no abdominal pain, no fever, no palpitations, no rash, no seizures and no vomiting   ? ?  ? ?Home Medications ?Prior to Admission medications   ?Medication Sig Start Date End Date Taking? Authorizing Provider  ?acetaminophen (TYLENOL) 500 MG tablet Take 1,000 mg by mouth every 6 (six) hours as needed for mild pain or headache.   Yes [provider]  ?atorvastatin (LIPITOR) 80 MG tablet TAKE 1 TABLET BY MOUTH  DAILY 09/06/19  Yes Martinique, Peter M, MD  ?baclofen (LIORESAL) 10 MG tablet Take 10 mg by mouth 3 (three) times daily. 09/26/21  Yes [provider]  ?diltiazem (CARDIZEM CD) 180 MG 24 hr capsule Take 180 mg by mouth daily. 05/06/21  Yes [provider]  ?ELIQUIS 5 MG TABS tablet TAKE 1 TABLET BY MOUTH  TWICE DAILY 07/08/21  Yes Martinique, Peter M, MD   ?fluticasone [facility]) 50 MCG/ACT nasal spray Place 1 spray into both nostrils at bedtime.   Yes [provider]  ?metFORMIN (GLUCOPHAGE) 1000 MG tablet Take 1,000 mg by mouth 2 (two) times daily. 04/07/21  Yes [provider]  ?metoprolol succinate (TOPROL-XL) 100 MG 24 hr tablet Take 1 tablet (100 mg total) by mouth daily. Take with or immediately following a meal. 03/18/21  Yes Camnitz, Ocie Doyne, MD  ?morphine (KADIAN) 30 MG 24 hr capsule Take 30 mg by mouth every 12 (twelve) hours. 10/15/20  Yes [provider]  ?nitroGLYCERIN (NITROSTAT) 0.4 MG SL tablet Place 1 tablet (0.4 mg total) under the tongue every 5 (five) minutes as needed for chest pain. 03/24/19  Yes Martinique, Peter M, MD  ?TRADJENTA 5 MG TABS tablet Take 5 mg by mouth in the morning. 03/03/12  Yes [provider]  ?ALPRAZolam Duanne Moron) 0.5 MG tablet Take one to 2 tablets 1 hour prior to MRI, may take another as needed. ?Patient not taking: Reported on 10/16/2021 09/16/21   Star Age, MD  ?docusate sodium (COLACE) 100 MG capsule Take 100 mg by mouth in the morning. ?Patient not taking: Reported on 10/16/2021    [provider]  ?FARXIGA 10 MG TABS tablet Take 10 mg by mouth in the morning. ?Patient not taking: Reported on 10/16/2021 06/23/20   [provider]  ?methocarbamol (ROBAXIN) 500 MG tablet Take 500 mg by mouth 2 (two) times daily as needed for muscle spasms. ?Patient not taking: Reported on  10/16/2021    [provider]  ?mirtazapine (REMERON) 45 MG tablet Take 45 mg by mouth at bedtime. ?Patient not taking: Reported on 10/16/2021 05/13/21   [provider]  ?Multiple Vitamin (MULTIVITAMIN WITH MINERALS) TABS tablet Take 1 tablet by mouth in the morning. ?Patient not taking: Reported on 10/16/2021    [provider]  ?Olmesartan-amLODIPine-HCTZ 40-5-12.5 MG TABS Take 1 tablet by mouth daily. ?Patient not taking: Reported on 10/16/2021    [provider]  ?ONE  TOUCH ULTRA TEST test strip 1 each by Other route as needed. Use 1 strip to check glucose daily 01/23/17   Wardell Honour, MD  ?oxyCODONE-acetaminophen (PERCOCET) 10-325 MG per tablet Take 1 tablet by mouth every 6 (six) hours as needed for pain. ?Patient not taking: Reported on 10/16/2021    [provider]  ?tiZANidine (ZANAFLEX) 2 MG tablet Take 2 mg by mouth 3 (three) times daily as needed. ?Patient not taking: Reported on 10/16/2021 04/08/21   [provider]  ?   ? ?Allergies    ?Ambien [zolpidem tartrate], Amoxicillin, and Penicillins   ? ?Review of Systems   ?Review of Systems  ?Constitutional:  Negative for chills and fever.  ?HENT:  Negative for ear pain and sore throat.   ?Eyes:  Negative for pain and visual disturbance.  ?Respiratory:  Negative for cough and shortness of breath.   ?Cardiovascular:  Negative for chest pain and palpitations.  ?Gastrointestinal:  Negative for abdominal pain and vomiting.  ?Genitourinary:  Negative for dysuria and hematuria.  ?Musculoskeletal:  Negative for arthralgias and back pain.  ?Skin:  Negative for color change and rash.  ?Neurological:  Negative for seizures and syncope.  ?Psychiatric/Behavioral:  Positive for agitation, confusion and hallucinations.   ?All other systems reviewed and are negative. ? ?Physical Exam ?Updated Vital Signs ?BP 126/82 (BP Location: Left Arm)   Pulse 75   Temp 98 ?F (36.7 ?C) (Oral)   Resp 17   Ht 6\' 2"  (1.88 m)   Wt 90.7 kg   SpO2 100%   BMI 25.68 kg/m?  ?Physical Exam ?Vitals and nursing note reviewed.  ?Constitutional:   ?   General: He is not in acute distress. ?   Appearance: He is well-developed.  ?HENT:  ?   Head: Normocephalic and atraumatic.  ?Eyes:  ?   Conjunctiva/sclera: Conjunctivae normal.  ?Cardiovascular:  ?   Rate and Rhythm: Normal rate and regular rhythm.  ?   Heart sounds: No murmur heard. ?Pulmonary:  ?   Effort: Pulmonary effort is normal. No respiratory distress.  ?   Breath sounds: Normal  breath sounds.  ?Abdominal:  ?   Palpations: Abdomen is soft.  ?   Tenderness: There is no abdominal tenderness.  ?Musculoskeletal:     ?   General: No swelling.  ?   Cervical back: Neck supple.  ?Skin: ?   General: Skin is warm and dry.  ?   Capillary Refill: Capillary refill takes less than 2 seconds.  ?Neurological:  ?   Mental Status: He is alert.  ?   Comments: When asked year states "1946" ?When asked season states "spring summer" ?Able to give first and last name ?Able to identify wife at bedside.   ?Psychiatric:     ?   Attention and Perception: He perceives visual hallucinations.     ?   Mood and Affect: Mood normal. Affect is labile.     ?   Thought Content: Thought content does not  include homicidal or suicidal ideation. Thought content does not include homicidal or suicidal plan.  ? ? ?ED Results / Procedures / Treatments   ?Labs ?(all labs ordered are listed, but only abnormal results are displayed) ?Labs Reviewed  ?CBC WITH DIFFERENTIAL/PLATELET - Abnormal; Notable for the following components:  ?    Result Value  ? RBC 4.08 (*)   ? Hemoglobin 12.3 (*)   ? HCT 37.6 (*)   ? All other components within normal limits  ?COMPREHENSIVE METABOLIC PANEL - Abnormal; Notable for the following components:  ? Glucose, Bld 130 (*)   ? Total Protein 6.4 (*)   ? Total Bilirubin 1.3 (*)   ? All other components within normal limits  ?URINALYSIS, ROUTINE W REFLEX MICROSCOPIC - Abnormal; Notable for the following components:  ? Hgb urine dipstick SMALL (*)   ? Protein, ur 100 (*)   ? All other components within normal limits  ?RAPID URINE DRUG SCREEN, HOSP PERFORMED - Abnormal; Notable for the following components:  ? Opiates POSITIVE (*)   ? Benzodiazepines POSITIVE (*)   ? All other components within normal limits  ?CBG MONITORING, ED - Abnormal; Notable for the following components:  ? Glucose-Capillary 193 (*)   ? All other components within normal limits  ?CBG MONITORING, ED - Abnormal; Notable for the following  components:  ? Glucose-Capillary 167 (*)   ? All other components within normal limits  ?CBG MONITORING, ED - Abnormal; Notable for the following components:  ? Glucose-Capillary 174 (*)   ? All other components wit

## 2021-10-15 NOTE — ED Triage Notes (Signed)
Pt BIB [facility] from home after wife called for AMS. Wife reports patient has worsening AMS for past week with hallucinations and odd behaviors. EMS reports pin point pupils and odd interactions en route. VS stable en route, no pain reported. ?

## 2021-10-15 NOTE — Telephone Encounter (Signed)
-----   Message from Star Age, MD sent at 10/15/2021  9:07 AM EDT ----- ?Please call patient's wife to let her know that his brain MRI with and without contrast did not show any acute findings and no significant volume loss what we call atrophy and no significant changes in keeping with hardening of the arteries, overall benign results. ? ?His recent EEG which is the electrical brainwave test, did not show any seizure-like findings but did show overall mild slowness in his brain waves which is a nonspecific finding and often seen in patients who may be lethargic, and also with certain sedating medications (which is likely the case in his case).  I recommend follow-up with primary care to discuss management of mood disorder and referral to psychiatry next.  I also recommend that she discuss her concerns with his pain management clinic and I would recommend that she oversee his medications. ? ?

## 2021-10-15 NOTE — Telephone Encounter (Signed)
Pt's wife called and said the pt continues to have hallucinations. Current he is at home this morning packing random things around the house (cook books, cat toys, her socks) to then "go to the house" for an appointment that he says he has. While I was on the phone with the wife, she stated he said to cancel the appointment and that he was not leaving the house again. She is going to call Dr Osborne Casco to see if she can get an appointment. We discussed that if by chance, for example, he has a UTI that can present with behavior changes. We discussed again that a psychiatric evaluation is recommended. She said the pt declined this. She is aware that if at any moment she feels his safety or her safety is in question to call 911 for help. She said the son has any weapons/guns locked up. She was very appreciative of the call.  ?

## 2021-10-15 NOTE — Telephone Encounter (Signed)
I called patient. I spoke with patient's wife, [name], per DPR. I advised her of patient's MRI & EEG results. Patient asked that I discuss this with the patient as well. Patient and patient's wife verbalized understanding of results and had no further questions. ?

## 2021-10-16 NOTE — Progress Notes (Signed)
Patient has been faxed out per the request of Dr. Dwyane Dee. Patient meets [facility] inpatient criteria per Lindon Romp, NP. Patient has been faxed out to the following facilities:  ? ?[facility]  [address] [place] [address] [phone]  ?[place], [place] [phone]  ?[place]  [place], [place] [address] [phone]  ?[facility]  [address] [place] [address] [phone]  ?[facility]  [address] [place], [place] [address] [phone]  ?[facility]  [place], [place] [address] [phone]  ?[facility]  [address] [place] [address] [phone]  ?[facility]  [address], [place] [address] [phone]  ?[place]  [address] [place] [address] [phone]  ?[facility]  [address], [place] [address] [phone]  ?[facility]  [address], [place] [address] [phone]  ?[facility]  [place], [place] [address] [phone]  ?[facility]  [address], [place] [address] [phone]  ?[place] [address], [place] [address] [phone]  ?[facility]  [address] [place] [address] [phone]  ? ?Mariea Clonts, MSW, LCSW-A  ?10:42 AM 10/16/2021   ?

## 2021-10-16 NOTE — BH Assessment (Signed)
Comprehensive Clinical Assessment (CCA) Note  10/16/2021 Michael Hartman [id number]  Nira Conn, NP, reviewed pt's chart and information and determined pt meets inpatient geriatric psych hospitalization criteria. Pt's referral information will be faxed out to multiple hospitals for potential placement. This information was relayed to pt's team at 0600.  The patient demonstrates the following risk factors for suicide: Chronic risk factors for suicide include: psychiatric disorder of MCI (per hx) . Acute risk factors for suicide include: N/A. Protective factors for this patient include: positive social support, responsibility to others (children, family), and hope for the future. Considering these factors, the overall suicide risk at this point appears to be none. Patient is not appropriate for outpatient follow up.  Therefore, no sitter is necessary for suicide precautions.  Flowsheet Row ED from 10/15/2021 in [facility] EMERGENCY DEPARTMENT Admission (Discharged) from 08/01/2020 in [facility] ENDOSCOPY  C-SSRS RISK CATEGORY No Risk No Risk     Chief Complaint:  Chief Complaint  Patient presents with   Altered Mental Status   Visit Diagnosis: MCI (per history)   CCA Screening, Triage and Referral (STR) Michael Hartman is a 66 year old patient who was brought to [facility] via EMS after his wife called EMS due to concerns about her husband's behaviors. When asked why he was brought to the hospital, pt states, "I just kind of wigged out a little bit. It was stupidity in its best." Per RN Triage Note: "Pt BIB [facility] from home after wife called for AMS. Wife reports patient has worsening AMS for past week with hallucinations and odd behaviors. EMS reports pin point pupils and odd interactions en route. VS stable en route, no pain reported."   Pt provided verbal consent for clinician to make contact with his preacher friend, Michael Hartman, though he states he has not spoken to him in  some time. He did not provide verbal consent for clinician to make contact with his spouse.  Pt denies SI or a hx of SI. He denies any prior attempts to kill himself or a plan to kill himself. Pt denies HI, AVH, NSSIB, access to guns/weapons (he states he had guns but that he got rid of them), engagement with the legal system, or SA.  Pt is oriented x5. His recent/remote memory appears intact. Pt was cooperative and pleasant throughout the assessment process. Pt's insight, judgement, and impulse control is impaired at this time.  Patient Reported Information How did you hear about [place]? Family/Friend  What Is the Reason for Your Visit/Call Today? Pt states, "I just kind of wigged out a little bit. It was stupidity in its best." Per RN Triage Note: "Pt BIB [facility] from home after wife called for AMS. Wife reports patient has worsening AMS for past week with hallucinations and odd behaviors. EMS reports pin point pupils and odd interactions en route. VS stable en route, no pain reported." Pt denies SI or a hx of SI. He denies any prior attempts to kill himself or a plan to kill himself. Pt denies HI, AVH, NSSIB, access to guns/weapons (he states he had guns but that he got rid of them), engagement with the legal system, or SA.  How Long Has This Been Causing You Problems? <Week  What Do You Feel Would Help You the Most Today? -- (Pt would like to be d/c home)   Have You Recently Had Any Thoughts About Hurting Yourself? No  Are You Planning to Commit Suicide/Harm Yourself At This time? No  Have you Recently Had Thoughts About Hurting Someone Michael Hartman? No  Are You Planning to Harm Someone at This Time? No  Explanation: No data recorded  Have You Used Any Alcohol or Drugs in the Past 24 Hours? No  How Long Ago Did You Use Drugs or Alcohol? No data recorded What Did You Use and How Much? No data recorded  Do You Currently Have a Therapist/Psychiatrist? No  Name of Therapist/Psychiatrist: No data  recorded  Have You Been Recently Discharged From Any Office Practice or Programs? No  Explanation of Discharge From Practice/Program: No data recorded    CCA Screening Triage Referral Assessment Type of Contact: Tele-Assessment  Telemedicine Service Delivery: Telemedicine service delivery: This service was provided via telemedicine using a 2-way, interactive audio and video technology  Is this Initial or Reassessment? Initial Assessment  Date Telepsych consult ordered in [facility]:  10/16/21  Time Telepsych consult ordered in [facility]:  0014  Location of Assessment: [facility] ED  Provider Location: [facility] Assessment Services   Collateral Involvement: Pt provided verbal consent to make contact with his friend, Michael Hartman; however, this number is currently unknown.   Does Patient Have a Automotive engineer Guardian? No data recorded Name and Contact of Legal Guardian: No data recorded If Minor and Not Living with Parent(s), Who has Custody? N/A  Is CPS involved or ever been involved? Never  Is APS involved or ever been involved? Never   Patient Determined To Be At Risk for Harm To Self or Others Based on Review of Patient Reported Information or Presenting Complaint? No  Method: No data recorded Availability of Means: No data recorded Intent: No data recorded Notification Required: No data recorded Additional Information for Danger to Others Potential: No data recorded Additional Comments for Danger to Others Potential: No data recorded Are There Guns or Other Weapons in Your Home? No data recorded Types of Guns/Weapons: No data recorded Are These Weapons Safely Secured?                            No data recorded Who Could Verify You Are Able To Have These Secured: No data recorded Do You Have any Outstanding Charges, Pending Court Dates, Parole/Probation? No data recorded Contacted To Inform of Risk of Harm To Self or Others: -- (N/A)    Does Patient Present under Involuntary  Commitment? No  IVC Papers Initial File Date: No data recorded  [place] of Residence: [place]   Patient Currently Receiving the Following Services: Not Receiving Services   Determination of Need: Emergent (2 hours)   Options For Referral: Medication Management; Outpatient Therapy; Geropsychiatric Facility     CCA Biopsychosocial Patient Reported Schizophrenia/Schizoaffective Diagnosis in Past: No   Strengths: Pt is able to identify his thoughts, feelings, and conderns. He answers the questions posed.   Mental Health Symptoms Depression:   None   Duration of Depressive symptoms:    Mania:   Racing thoughts   Anxiety:    Worrying; Tension   Psychosis:   Delusions; Hallucinations   Duration of Psychotic symptoms:  Duration of Psychotic Symptoms: Less than six months   Trauma:   None   Obsessions:   None   Compulsions:   None   Inattention:   None   Hyperactivity/Impulsivity:   None   Oppositional/Defiant Behaviors:   None   Emotional Irregularity:   Potentially harmful impulsivity   Other Mood/Personality Symptoms:   None noted  Mental Status Exam Appearance and self-care  Stature:   Average   Weight:   Average weight   Clothing:   Casual   Grooming:   Normal   Cosmetic use:   None   Posture/gait:   Normal   Motor activity:   Not Remarkable   Sensorium  Attention:   Normal   Concentration:   Normal   Orientation:   X5   Recall/memory:   Normal   Affect and Mood  Affect:   Anxious   Mood:   Anxious   Relating  Eye contact:   Normal   Facial expression:   Responsive   Attitude toward examiner:   Cooperative (Pleasant)   Thought and Language  Speech flow:  Clear and Coherent   Thought content:   Appropriate to Mood and Circumstances   Preoccupation:   None   Hallucinations:   None   Organization:  No data recorded  [employer] of Knowledge:   Good   Intelligence:    Above Average   Abstraction:   Functional   Judgement:   Impaired   Reality Testing:   Adequate   Insight:   Fair   Decision Making:   Confused   Social Functioning  Social Maturity:   Responsible   Social Judgement:   Normal   Stress  Stressors:   Illness   Coping Ability:   Normal   Skill Deficits:   Decision making   Supports:   Family; Friends/Service system     Religion: Religion/Spirituality Are You A Religious Person?: Yes What is Your Religious Affiliation?: Christian How Might This Affect Treatment?: Not assessed  Leisure/Recreation: Leisure / Recreation Do You Have Hobbies?: Yes Leisure and Hobbies: 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?: No  Education: Education Is Patient Currently Attending School?: No Last Grade Completed: 16 Did You Attend College?: Yes What Type of College Degree Do you Have?: Pt has a degree in criminal justice Did You Have An Individualized Education Program (IIEP): No Did You Have Any Difficulty At School?: No Patient's Education Has Been Impacted by Current Illness: No   CCA Family/Childhood History Family and Relationship History: Family history Marital status: Married Number of Years Married:  (Not assessed) What types of issues is patient dealing with in the relationship?: Pt  denies Additional relationship information: N/A Does patient have children?: Yes How many children?: 2 How is patient's relationship with their children?: Pt shares he has a good relationship with his children  Childhood History:  Childhood History By whom was/is the patient raised?: Both parents Did patient suffer any verbal/emotional/physical/sexual abuse as a child?: No Did patient suffer from severe childhood neglect?: No Has patient ever been sexually abused/assaulted/raped as an adolescent or adult?: No Was the patient ever a victim of a crime or a disaster?: No Witnessed domestic violence?: No Has patient been affected by domestic violence as  an adult?: No  Child/Adolescent Assessment:     CCA Substance Use Alcohol/Drug Use: Alcohol / Drug Use Pain Medications: See MAR Prescriptions: See MAR Over the Counter: See MAR History of alcohol / drug use?: No history of alcohol / drug abuse Longest period of sobriety (when/how long): N/A Negative Consequences of Use:  (N/A) Withdrawal Symptoms:  (N/A)                         ASAM's:  Six Dimensions of Multidimensional Assessment  Dimension 1:  Acute Intoxication and/or Withdrawal Potential:      Dimension 2:  Biomedical Conditions and Complications:      Dimension 3:  Emotional, Behavioral, or Cognitive Conditions and Complications:     Dimension 4:  Readiness to Change:     Dimension 5:  Relapse, Continued use, or Continued Problem Potential:     Dimension 6:  Recovery/Living Environment:     ASAM Severity Score:    ASAM Recommended Level of Treatment: ASAM Recommended Level of Treatment:  (N/A)   Substance use Disorder (SUD) Substance Use Disorder (SUD)  Checklist Symptoms of Substance Use:  (N/A)  Recommendations for Services/Supports/Treatments: Recommendations for Services/Supports/Treatments Recommendations For Services/Supports/Treatments:  (N/A)  Discharge Disposition:    DSM5  Diagnoses: Patient Active Problem List   Diagnosis Date Noted   Persistent atrial fibrillation (HCC) 11/21/2020   MCI (mild cognitive impairment) 09/19/2020   Celiac disease    Gastro-esophageal reflux disease with esophagitis    Atrial fibrillation    Typical atrial flutter    Secondary hypercoagulable state 07/26/2020   Cough 09/16/2017   Current use of long term anticoagulation 01/05/2017   Diabetes mellitus type 2 03/23/2012   Pure hypercholesterolemia 08/02/2007   OSA (obstructive sleep apnea) 08/02/2007   Essential hypertension 08/02/2007   CAD (coronary artery disease) 08/02/2007   Chronic rhinitis 08/02/2007     Referrals to Alternative Service(s): Referred to Alternative Service(s):   Place:   Date:   Time:    Referred to Alternative Service(s):   Place:   Date:   Time:    Referred to Alternative Service(s):   Place:   Date:   Time:    Referred to Alternative Service(s):   Place:   Date:   Time:     Ralph Dowdy, LMFT

## 2021-10-16 NOTE — ED Notes (Addendum)
Pt wanded pt  ?

## 2021-10-17 MED ORDER — ACETAMINOPHEN 325 MG PO TABS: 325.0000 mg | ORAL_TABLET | Freq: Four times a day (QID) | ORAL | Status: DC | PRN

## 2021-10-17 MED ORDER — METOPROLOL SUCCINATE ER 25 MG PO TB24
100.0000 mg | ORAL_TABLET | Freq: Every day | ORAL | Status: DC
  Administered 2021-10-17 – 2021-10-23 (×7): 100 mg via ORAL
  Filled 2021-10-17 (×7): qty 4

## 2021-10-17 MED ORDER — OXYCODONE-ACETAMINOPHEN 10-325 MG PO TABS: 1.0000 | ORAL_TABLET | Freq: Four times a day (QID) | ORAL | Status: DC | PRN

## 2021-10-17 MED ORDER — METFORMIN HCL 500 MG PO TABS
1000.0000 mg | ORAL_TABLET | Freq: Two times a day (BID) | ORAL | Status: DC
Start: 2021-10-17 — End: 2021-10-23
  Administered 2021-10-17 – 2021-10-23 (×12): 1000 mg via ORAL
  Filled 2021-10-17 (×12): qty 2

## 2021-10-17 MED ORDER — MORPHINE SULFATE ER 30 MG PO TBCR
30.0000 mg | EXTENDED_RELEASE_TABLET | Freq: Two times a day (BID) | ORAL | Status: DC
  Administered 2021-10-17 – 2021-10-23 (×12): 30 mg via ORAL
  Filled 2021-10-17 (×12): qty 1

## 2021-10-17 MED ORDER — OXYCODONE HCL 5 MG PO TABS
10.0000 mg | ORAL_TABLET | Freq: Four times a day (QID) | ORAL | Status: DC | PRN
  Administered 2021-10-22: 10 mg via ORAL
  Filled 2021-10-17 (×2): qty 2

## 2021-10-17 MED ORDER — ZIPRASIDONE MESYLATE 20 MG IM SOLR
20.0000 mg | INTRAMUSCULAR | Status: DC | PRN
  Administered 2021-10-18: 20 mg via INTRAMUSCULAR
  Filled 2021-10-17: qty 20

## 2021-10-17 MED ORDER — LORAZEPAM 2 MG/ML IJ SOLN
2.0000 mg | INTRAMUSCULAR | Status: DC | PRN
  Administered 2021-10-18: 2 mg via INTRAMUSCULAR
  Filled 2021-10-17 (×2): qty 1

## 2021-10-17 NOTE — ED Notes (Signed)
Patient's wife states he takes metformin and many other medications at home. I reviewed chart and noted no medication orders. Dr. Eulis Foster ED provider messaged to review.  ?

## 2021-10-17 NOTE — ED Provider Notes (Signed)
Emergency Medicine Observation Re-evaluation Note ? ?Michael Hartman is a 66 y.o. male, seen on rounds today.  Pt initially presented to the ED for complaints of Altered Mental Status ?Currently, the patient is resting in bed.  I have been informed by nursing at 5:30 PM, that the patient has not been started on his home meds.  Review of medication reconciliation, documented by pharmacy indicates that it is not clear that the patient takes any medications.  There is a indication that he is possibly taking chronic pain medicine.  He is a very high MME, and  has been prescribed both short and long-acting narcotic pain medication.  Last refill 09/26/2021, for 30 days.  It appears that he gets these regularly.  Patient's PCP notes are not visible in the EMR.  He has had recent evaluation by neurology for memory loss, with delusions.  At this time the patient states "I am done with the program and then leaving.  I plan on driving home." ? ?Nursing has reported to me that the patient has been confused about things like where the bathroom is, then where his room is after going to the bathroom.  Earlier the patient stated that he was in jail and wondered what his bond was.  Patient's wife visited him earlier she reported that he takes his medications sporadically. ? ?Of note, the patient was brought here by EMS not as he states, that he drove himself. ? ?Physical Exam  ?BP (!) 160/90 (BP Location: Right Arm)   Pulse 80   Temp 97.7 ?F (36.5 ?C) (Oral)   Resp 17   Ht 6\' 2"  (1.88 m)   Wt 90.7 kg   SpO2 100%   BMI 25.68 kg/m?  ?Physical Exam ?General: Nontoxic appearing ?Cardiac: Normal heart ?Lungs: Normal respiratory ?Neuro: Normal strength bilaterally.  No dysarthria or aphasia.  He is oriented to person place and date. ?Psych: No apparent internal responsiveness ? ?ED Course / MDM  ?EKG:EKG Interpretation ? ?Date/Time:  Tuesday Oct 15 2021 19:24:39 EDT ?Ventricular Rate:  52 ?PR Interval:  147 ?QRS Duration: 81 ?QT  Interval:  418 ?QTC Calculation: 389 ?R Axis:   62 ?Text Interpretation: Sinus rhythm Anteroseptal infarct, age indeterminate When compared with ECG of 11/21/2020, Premature atrial complexes are no longer present Confirmed by 11/23/2020 (Dione Booze) on 10/16/2021 6:59:03 AM ? ?I have reviewed the labs performed to date as well as medications administered while in observation.  Recent changes in the last 24 hours include he has been persistently confused.  He is showing signs of wanting to leave.  He does not appear to be safe to go home.  I will initiate IVC paperwork. ? ?Plan  ?Current plan is for placement by psychiatry in a general psychiatric unit. ? Michael Hartman is under involuntary commitment. ? ? ?  ?[name], MD ?10/17/21 1859 ? ?

## 2021-10-17 NOTE — ED Notes (Signed)
Patient walked out of unit stating he was going home; Staff was able to redirect patient back to room as IVC papers are in the working; RN moved sitters around in unit to provided this patient with sitter due to IVC; Pt seems to be confused and can not tell us what day it is or where is located-Monique,RN  ?
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
## 2021-10-17 NOTE — Progress Notes (Signed)
Patient has been denied by [facility] due to no beds being available. Patient meets [place] inpatient criteria per Lindon Romp, NP. Patient has been faxed out to the following facilities:  ? ?[facility]  [address] [place] [address] [phone]  ?[place], [place] [phone]  ?[place]  [place], [place] [address] [phone]  ?[facility]  [address] [place] [address] [phone]  ?[facility]  [address] [place], [place] [address] [phone]  ?[facility]  [place], [place] [address] [phone]  ?[facility]  [address] [place] [address] [phone]  ?[facility]  [address] [place] [address] [phone]  ?[facility]  [place] [place] [address] [phone]  ?[facility]  [address] [place] [address] [phone]  ?[facility]  [address], [place] [address] [phone]  ?[facility]  [place], [place] [address] [phone]  ?[facility]  [address], [place] [address] [phone]  ?[place] [address], [place] [address] [phone]  ?[facility]  [address], [place] [address] [phone]  ? ?Mariea Clonts, MSW, LCSW-A  ?2:02 PM 10/17/2021   ?

## 2021-10-17 NOTE — ED Notes (Signed)
Patient is alert to self- unaware of time, stating year is "2003" and unaware fo situation stating "am I at a police department? How did I get here? Where's my mom?" ?

## 2021-10-17 NOTE — ED Notes (Signed)
Michael Hartman (Daughter) of [name] in 21 called and ask for an update. Her number is [phone]. ?

## 2021-10-17 NOTE — ED Notes (Signed)
Patient's wife visiting. Patient continuously thinks he is in jail and asks his bond. He is easily redirected, but forgets within minutes. Calm, cooperative and pleasant. Ambulatory with steady gait. Ate 100% of lunch. No complaints.  ?

## 2021-10-17 NOTE — ED Notes (Signed)
Dr. Effie Shy at bedside to evaluate patient.  ?

## 2021-10-17 NOTE — Progress Notes (Signed)
Patient now saying he worked at [employer] office for 32 years. Also saying again that he will be having surgery in the morning and thinks we are getting him prepared, though he is agreeable at this time.  ?

## 2021-10-17 NOTE — ED Notes (Signed)
IVC paperwork done and handed to nurse 

## 2021-10-18 ENCOUNTER — Ambulatory Visit: Payer: Medicare Other | Admitting: Cardiology

## 2021-10-18 DIAGNOSIS — F29 Unspecified psychosis not due to a substance or known physiological condition: Secondary | ICD-10-CM

## 2021-10-18 LAB — CBG MONITORING, ED
Glucose-Capillary: 193 mg/dL — ABNORMAL HIGH (ref 70–99)
Glucose-Capillary: 167 mg/dL — ABNORMAL HIGH (ref 70–99)
Glucose-Capillary: 174 mg/dL — ABNORMAL HIGH (ref 70–99)

## 2021-10-18 MED ORDER — QUETIAPINE FUMARATE 50 MG PO TABS
50.0000 mg | ORAL_TABLET | Freq: Every day | ORAL | Status: DC
  Administered 2021-10-18 – 2021-10-22 (×5): 50 mg via ORAL
  Filled 2021-10-18 (×5): qty 1

## 2021-10-18 MED ORDER — STERILE WATER FOR INJECTION IJ SOLN
INTRAMUSCULAR | Status: AC
  Administered 2021-10-18: 1.2 mL
  Filled 2021-10-18: qty 10

## 2021-10-18 MED ORDER — INSULIN ASPART 100 UNIT/ML IJ SOLN
0.0000 [IU] | Freq: Three times a day (TID) | INTRAMUSCULAR | Status: DC
  Administered 2021-10-18 – 2021-10-19 (×3): 2 [IU] via SUBCUTANEOUS
  Administered 2021-10-19: 1 [IU] via SUBCUTANEOUS
  Administered 2021-10-21 (×3): 2 [IU] via SUBCUTANEOUS
  Administered 2021-10-22: 3 [IU] via SUBCUTANEOUS
  Administered 2021-10-23: 1 [IU] via SUBCUTANEOUS

## 2021-10-18 MED ORDER — LORAZEPAM 1 MG PO TABS
1.0000 mg | ORAL_TABLET | Freq: Four times a day (QID) | ORAL | Status: DC | PRN
  Administered 2021-10-20 – 2021-10-22 (×3): 1 mg via ORAL
  Filled 2021-10-18 (×3): qty 1

## 2021-10-18 NOTE — Progress Notes (Signed)
Patient has not slept at all on this shift, not even for one minute. He was waving and asking for help at his doorway, saying that the door was locked (purple zone patient doors are unable to lock), once I told him to pull instead of push the door, he immediately came out of his room (Michael Hartman), insisting that he had to speak to "Sargentt Mcleod", saying that the Sitter for room 48 was his police sargent. Very difficult to redirect patient back to his room.even when she told him she was not that person he did not believe her or me. He did eventually and very reluctantly go back to his room. He says that he will be turning in his work resignation because of how he is being treated and that "he has done that before several times".  ?

## 2021-10-18 NOTE — ED Notes (Signed)
Pt has been at desk for an hour asking about keys and about his wife. Pt updated multiple times on plan. Pt getting more agitated. MD made aware ?

## 2021-10-18 NOTE — ED Notes (Signed)
Pt made phone call to wife  ?

## 2021-10-18 NOTE — ED Notes (Signed)
Patient was given a cup of coke. 

## 2021-10-18 NOTE — ED Notes (Signed)
Pt wife and son are visiting with pt. Pt is still confused about the time and situation and thinks that he is in jail and at work. Pt continually redirected by staff and family. ?
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
## 2021-10-18 NOTE — ED Notes (Signed)
Patient has been up agitated since shift change; Pt does not know where he is and has been looking for his keys to get home to his "sick wife" pt was reassured that wife was not sick and she had been her to visit today; Pt has no knowledge of time or current situation; RN called wife to allow patient to be reassured she is ok.-Monique,RN  ?

## 2021-10-18 NOTE — Progress Notes (Signed)
Inpatient Behavioral Health Placement ? ?Pt meets inpatient criteria per Liborio Nixon, NP.  There are no available beds at [facility]. Referral was sent to the following facilities;  ? ?Destination ?Service Provider Address Phone Fax  ?[facility]  [address] [place] [address] [phone]  ?[facility]  [address] [place] [address] [phone]  ?[facility]  [address] [place], [place] [address] [phone]  ?[facility]  [address] [place] [address] [phone]  ?[facility]  [address] [place], [place] [address] [phone]  ?[facility]  [address] [place], [place] [address] [phone]  ?[facility]  [address] [place] [address] [phone]  ?[facility]  [address] [place] [address] [phone]  ?[facility]  [address] [place]., [place] [address] [phone]  ?[facility]  [address], [place] [address] [phone]  ?[facility]  [address], [place] [address] [phone]  ?[facility]  [address] [place], [place] [address] [phone]  ?[facility]  [address], [place] [address] [phone]  ?[facility]  [address] [place] [address] [phone]  ?[facility]  [address], [place] [address] [phone]  ?[facility]  [address] [place] [place] [address] [phone]  ?[facility]  [address] [place] [address] [phone]  ?[facility]  [address], [place] [address] [phone]  ?[facility]  [address], [place] [address] [phone]  ? ? ?Situation ongoing,  CSW will follow up. ? ? ?Maryjean Ka, MSW, LCSWA ?10/18/2021  @ 5:36 PM ? ?

## 2021-10-18 NOTE — Progress Notes (Signed)
Went to door to check on patient at [facility], he waved to me and asked for something to eat. Patient was given a sandwich bag, coke, and crackers. He is saying that he knows the patient in another room, that they went to school together, though this patient is not quite half his age. Still having trouble staying oriented to self, time, and place.  ?

## 2021-10-18 NOTE — Progress Notes (Addendum)
Inpatient Diabetes Program Recommendations ? ?AACE/ADA: New Consensus Statement on Inpatient Glycemic Control (2015) ? ?Target Ranges:  Prepandial:   less than 140 mg/dL ?     Peak postprandial:   less than 180 mg/dL (1-2 hours) ?     Critically ill patients:  140 - 180 mg/dL  ? ? ? ?To ED with AMS ? ?History: Diabetes Type 2 ? ?Home Meds: Metformin 1000 mg BID ?          Tradjenta 5 mg daily ? ?Current Orders: Metformin 1000 mg BID ? ? ? ? ?Per Chart Review (see Neurology office note from visit 09/12/2021, pt with History of Type 2 Diabetes and takes 2 oral diabetes meds at home ? ?No Lab glucose data since 10/15/2021 ? ?No CBG data ? ? ? ?MD- While patient waiting for placement, can continue home oral Diabetes meds (if pt allowed to eat PO diet) ? ?Please place order for CBG checks TID before meals ? ?Change PO diet to Carbohydrate Modified ? ? ? ?--Will follow patient during hospitalization-- ? ?Ambrose Finland RN, MSN, CDE ?Diabetes Coordinator ?Inpatient Glycemic Control Team ?Team Pager: [phone] (8a-5p) ? ? ? ?
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
## 2021-10-19 LAB — CBG MONITORING, ED
Glucose-Capillary: 132 mg/dL — ABNORMAL HIGH (ref 70–99)
Glucose-Capillary: 148 mg/dL — ABNORMAL HIGH (ref 70–99)
Glucose-Capillary: 149 mg/dL — ABNORMAL HIGH (ref 70–99)
Glucose-Capillary: 153 mg/dL — ABNORMAL HIGH (ref 70–99)

## 2021-10-19 NOTE — ED Notes (Signed)
Pt has had phone call with wife for the evening and able to go into room quietly for rest.  ?

## 2021-10-19 NOTE — ED Notes (Signed)
Breakfast order placed ?

## 2021-10-19 NOTE — ED Provider Notes (Signed)
Emergency Medicine Observation Re-evaluation Note ? ?Michael Hartman is a 66 y.o. male, seen on rounds today.  Pt initially presented to the ED for complaints of Altered Mental Status ?Currently, the patient is eating lunch. ? ?Physical Exam  ?BP (!) 142/96 (BP Location: Left Arm)   Pulse 71   Temp 98.3 ?F (36.8 ?C) (Oral)   Resp 15   Ht 6\' 2"  (1.88 m)   Wt 90.7 kg   SpO2 96%   BMI 25.68 kg/m?  ?Physical Exam ?General: Awake, alert, nondistressed ?Cardiac: Extremities well-perfused ?Lungs: Breathing is unlabored ?Psych: No agitation ? ?ED Course / MDM  ?EKG:EKG Interpretation ? ?Date/Time:  Tuesday Oct 15 2021 19:24:39 EDT ?Ventricular Rate:  52 ?PR Interval:  147 ?QRS Duration: 81 ?QT Interval:  418 ?QTC Calculation: 389 ?R Axis:   62 ?Text Interpretation: Sinus rhythm Anteroseptal infarct, age indeterminate When compared with ECG of 11/21/2020, Premature atrial complexes are no longer present Confirmed by Delora Fuel ([id number]) on 10/16/2021 6:59:03 AM ? ?I have reviewed the labs performed to date as well as medications administered while in observation.  Recent changes in the last 24 hours include continued confusion and intermittent confusion. ? ?Plan  ?Current plan is for geri-psych admission. ?DANA BESTOR is under involuntary commitment. ?  ? ?  ?Godfrey Pick, MD ?10/19/21 1227 ? ?

## 2021-10-20 LAB — CBG MONITORING, ED
Glucose-Capillary: 134 mg/dL — ABNORMAL HIGH (ref 70–99)
Glucose-Capillary: 141 mg/dL — ABNORMAL HIGH (ref 70–99)
Glucose-Capillary: 133 mg/dL — ABNORMAL HIGH (ref 70–99)
Glucose-Capillary: 148 mg/dL — ABNORMAL HIGH (ref 70–99)

## 2021-10-20 NOTE — ED Notes (Signed)
Pt received breakfast tray 

## 2021-10-20 NOTE — ED Provider Notes (Signed)
Emergency Medicine Observation Re-evaluation Note ? ?Michael Hartman is a 66 y.o. male, seen on rounds today.  Pt initially presented to the ED for complaints of Altered Mental Status ?Currently, the patient is sitting on his bed talking to family. ? ?Physical Exam  ?BP (!) 153/86 (BP Location: Left Arm)   Pulse 69   Temp 97.9 ?F (36.6 ?C) (Oral)   Resp 18   Ht 6\' 2"  (1.88 m)   Wt 90.7 kg   SpO2 99%   BMI 25.68 kg/m?  ?Physical Exam ?General: Awake, alert, calm ?Cardiac: Extremities well perfused ?Lungs: Breathing is unlabored ?Psych: No apparent confusion or agitation ? ?ED Course / MDM  ?EKG:EKG Interpretation ? ?Date/Time:  Tuesday Oct 15 2021 19:24:39 EDT ?Ventricular Rate:  52 ?PR Interval:  147 ?QRS Duration: 81 ?QT Interval:  418 ?QTC Calculation: 389 ?R Axis:   62 ?Text Interpretation: Sinus rhythm Anteroseptal infarct, age indeterminate When compared with ECG of 11/21/2020, Premature atrial complexes are no longer present Confirmed by Delora Fuel ([id number]) on 10/16/2021 6:59:03 AM ? ?I have reviewed the labs performed to date as well as medications administered while in observation.  Recent changes in the last 24 hours include endorsing anxiety overnight.  Today, patient was septic to Michael Hartman.  Social work requested documented medical clearance.  Medical clearance follows: ? ?Patient presented to the ED 5 days ago for confusion, odd behaviors, and concern of hallucinations.  He underwent a medical work-up which was negative for acute findings.  He has been medically stable during his 5 day stay here in the emergency department.  Patient is medically cleared at this time. ? ?Plan  ?Current plan is for geri-psych admission. ?Michael Hartman is under involuntary commitment. ?  ? ?  ?Godfrey Pick, MD ?10/20/21 1307 ? ?

## 2021-10-20 NOTE — Progress Notes (Signed)
CSW followed-up with Carla with [facility] admissions in reference to a referral sent for placement. It was reported that she will review patient with the provider.  ? ?Glennie Isle, MSW, LCSW-A, LCAS ?Phone: [phone] ?Disposition/TOC ? ?

## 2021-10-20 NOTE — Progress Notes (Addendum)
Per [place], admissions, pt has been accepted to [facility], [facility] hall/Geri unit. Accepting provider is Dr. Noelle Penner. Patient can arrive 10/22/2021 after 7:00am. Number for report is [phone]. ? ?Crissie Reese, MSW, LCSW-A ?Phone: [phone] ?Disposition/TOC ? ? ?

## 2021-10-20 NOTE — ED Notes (Signed)
Pt is awake and presenting as restless, stating "my anxiety is keeping me from getting rest". Pt noted to come in and out of room and needs constant redirection by staff. PRN medication administered to help relieve anxiety and encourage rest. Will continue to monitor for therapeutic effects.  ?

## 2021-10-20 NOTE — Progress Notes (Addendum)
Per Darrol Angel, NP, patient meets criteria for inpatient treatment. There are no available beds at [facility] today. CSW faxed referrals to the following facilities for review: ?          [facility] ? ?[address] [place] [address] [phone] --  ?[place] N/A [address], [place] [phone] --  ?[facility]  Pending - Request Sent N/A [address] [place] [address] [phone] --  ?[facility]  Pending - Request Sent N/A [address] [place] [address] [phone] --  ?[facility]  Pending - Request Sent N/A [address] [place], [place] [address] [phone] --  ?[place] N/A [address] [place] [address] [id number] --  ?[place] N/A [place], [place] [address] [phone] --  ?[place] N/A [address] [place] [address] [phone] --  ?[place] N/A [address] [place] [address] [phone] --  ?[place] N/A [address], [place] [address] [phone] --  ?[place] N/A [address] [place] [address] [phone] --  ?[place], [place] [address] [phone] --  ?[facility]  Pending - Request Sent N/A [address] [place] [address] [phone] --  ?[place] [address] [place] [address] [phone] --  ?[place] N/A [address] [place] [address] [phone] --  ?[place] N/A [place] [place] [address] [id number] --  ?[facility]  Pending - Request Sent N/A [place], [place] [address] [phone] --  ?[facility]  Pending - Request Sent N/A [address], [place] [address] [phone] --  ?[place] [address] [place] [address] [id number] [phone] --  ? ?TT[facility] will continue to seek bed placement. ? ?Glennie Isle, MSW, LCSW-A, LCAS ?Phone: [phone] ?Disposition/TOC ? ?

## 2021-10-21 LAB — CBG MONITORING, ED
Glucose-Capillary: 164 mg/dL — ABNORMAL HIGH (ref 70–99)
Glucose-Capillary: 164 mg/dL — ABNORMAL HIGH (ref 70–99)
Glucose-Capillary: 154 mg/dL — ABNORMAL HIGH (ref 70–99)

## 2021-10-22 LAB — CBG MONITORING, ED
Glucose-Capillary: 157 mg/dL — ABNORMAL HIGH (ref 70–99)
Glucose-Capillary: 208 mg/dL — ABNORMAL HIGH (ref 70–99)
Glucose-Capillary: 111 mg/dL — ABNORMAL HIGH (ref 70–99)

## 2021-10-22 MED ORDER — APIXABAN 5 MG PO TABS
5.0000 mg | ORAL_TABLET | Freq: Two times a day (BID) | ORAL | Status: DC
  Administered 2021-10-22 – 2021-10-23 (×3): 5 mg via ORAL
  Filled 2021-10-22 (×2): qty 1

## 2021-10-22 NOTE — ED Notes (Signed)
Michael Hartman, nurse at [facility] called and asked if it was possible to cancel the patient from coming tonight since transport had not arrived to pick him up yet. She stated they could take him early in the morning. TOC and charge nurse informed of the request. Dispatch called to see if they would have someone available early morning or if it would be better to keep our spot in line for transport. Dispatch will check on details and call me back with further insight.  ?
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
## 2021-10-22 NOTE — ED Notes (Signed)
Patient continues to talk about events that occurred 2-3 years ago; when asked what year it is patients states "1999"; Pt continues to ask for his keys and wallet so he can go take care for his wife Joyce Gross; patient does not remember his wife visiting him today-Monique,RN  ?

## 2021-10-22 NOTE — ED Notes (Signed)
Pts wife [name] updated regarding pts status. Pt talked with wife on phone.  ?

## 2021-10-22 NOTE — Progress Notes (Signed)
Per Nursing staff Ruffin Frederick, RN pt will be transported in the morning to [facility]. ? ?Maryjean Ka, MSW, LCSWA ?10/22/2021 11:02 PM ? ? ?

## 2021-10-22 NOTE — ED Notes (Signed)
Patient anxious. States, "I just saw my wife across the road when I drove in here. I know I did. You won't let me go get here". Attempted to re-orient patient without success. Wife called to speak with patient to attempt to calm him.  ?

## 2021-10-22 NOTE — ED Notes (Signed)
Pt calm and cooperative. Respirations even and unlabored. NAD noted at this time 

## 2021-10-23 ENCOUNTER — Ambulatory Visit: Payer: Medicare Other | Admitting: Family Medicine

## 2021-10-23 LAB — CBG MONITORING, ED: Glucose-Capillary: 130 mg/dL — ABNORMAL HIGH (ref 70–99)

## 2021-10-23 NOTE — ED Notes (Signed)
Pt asleep and resting at this time, unable to obtain VS. Will attempt when pt is awake.  ?

## 2021-10-23 NOTE — ED Notes (Signed)
Breakfast order placed ?

## 2021-10-23 NOTE — ED Notes (Signed)
Attempting call to [facility] no answer. ?

## 2021-10-23 NOTE — ED Notes (Signed)
TC call to wife to report Pt will be going to [facility] facility. Wife reports she has telephone # for facility. ?

## 2022-01-10 ENCOUNTER — Encounter: Payer: Self-pay | Admitting: Family Medicine

## 2022-01-11 ENCOUNTER — Other Ambulatory Visit: Payer: Self-pay | Admitting: Cardiology

## 2022-01-14 ENCOUNTER — Encounter: Payer: Self-pay | Admitting: Cardiology

## 2022-01-15 ENCOUNTER — Encounter: Payer: Self-pay | Admitting: Family Medicine

## 2022-01-15 ENCOUNTER — Ambulatory Visit (INDEPENDENT_AMBULATORY_CARE_PROVIDER_SITE_OTHER): Payer: Medicare Other | Admitting: Family Medicine

## 2022-01-15 VITALS — BP 162/89 | HR 66 | Ht 74.0 in | Wt 251.0 lb

## 2022-01-15 DIAGNOSIS — R413 Other amnesia: Secondary | ICD-10-CM | POA: Diagnosis not present

## 2022-01-15 DIAGNOSIS — G4733 Obstructive sleep apnea (adult) (pediatric): Secondary | ICD-10-CM

## 2022-01-15 DIAGNOSIS — Z9989 Dependence on other enabling machines and devices: Secondary | ICD-10-CM

## 2022-01-15 DIAGNOSIS — R4189 Other symptoms and signs involving cognitive functions and awareness: Secondary | ICD-10-CM | POA: Diagnosis not present

## 2022-01-15 NOTE — Progress Notes (Signed)
Chief Complaint  Patient presents with   Follow-up    Rm 1, w wife Michael Hartman. Here for memory f/u. Pt reports no change in memory. Wife reports moments where he forgets things or repeats himself. MMSE:29     HISTORY OF PRESENT ILLNESS:  01/15/22 ALL:  Michael Hartman returns for follow up for memory loss. He was last seen by Michael Hartman 09/2021. MRI was unremarkable. EEG showed generalized slowing but no seizure activity. He was involuntarily committed to Michael Hartman 5/17-5/25/2023 due to worsening delusions and hallucinations. He was advised to continue outpatient follow up with Michael Hartman but has refused. Per his wife, he has not returned to pain management. Per PDMP, Oxycodone was last filled for 90 tabs 09/26/2021.    He presents, today, with his wife. They both tell me that he seems to be doing much better. Since stopping opiates, hallucinations have stopped. He can be irritable and tends to repeat himself at times but erratic behaviors are better. No obvious delusional thoughts but his wife does mention comments that he makes that can be hurtful. He has been able to drive without difficulty. He can grab groceries if needed. He is performing all ADLs independently. MMSE 29/30, today. He tells me that back pain has worsened but he does not wish to return to pain management. He is not interested in surgery. He denies concerns of anxiety or depression. He does not think he needs to pursue psychology/psychiatry referral. He is sleeping well.   He continues CPAP. Notes from Michael Hartman reviewed in Michael Hartman. Download in 07/2021 shows optimal compliance and apnea well managed.   09/12/2021 SA: He reports that he does not want to be here today and that he is only here because his wife made him come.  He has no new concerns, he denies any discrepancies with his narcotic pain medication and he does not wish to pursue an MRI but he voided if his wife insists. He would be willing to do any imaging of his wife  wants it to be done. He repeatedly states "I don't care".  Indicates that he does not wish for his wife to go up with him when he has a pain clinic follow-up.  She reports that she is not fully sure how he takes his medication as he does it on his own, sometimes he takes medication out of the medication bottle and may put it in a different bottle.  She had previously indicated that he may request refills on his pain medication early before they are due .  He had A fib ablation in May 2022. He is followed by cardiology on a regular basis.  For the past week or so he has had delusions including sister, she reports that he does not even have a sister.  She reports that primary care did not offer any additional recommendation for mood disorder.  Patient declines a referral to psychiatry today.  She wonders if he can have his brain MRI now.  He appears to be indifferent to it but would be willing to pursue it with light sedation due to his claustrophobia.  She indicates that even if we were to start medication, she is not sure that he would come back for follow-up here.  I explained to her that memory medication can exacerbate hallucinations, if he has evidence of hallucinations, it is certainly a higher risk.  In addition, he takes several MEDICATIONS including Robaxin, Remeron, long-acting morphine sulfate, oxycodone, and Zanaflex.  04/24/2021 ALL: Michael Hartman is a 66 y.o. male here today for follow up for MCI. He was last seen by Michael Hartman 10/2020. Neurocognitive eval was suggestive of MCI but variable effort and results noted. Dementia medications were not advised and he was encouraged to follow up with PCP for concerns of anxiety and depression. CT head showed some calcifications of vertebral and internal carotid arteries with minimal atrophy. He presents with his wife who aids in history. He continues to note short term memory loss. 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 He  reports being independent with ADLs. Mrs Michael Hartman does not dispute. He reports managing his medicaitons without difficulty. Mrs Michael Hartman expresses some concern with this statement. She reports that he will go to the pharmacy to fill medications earlier than he should. He tells me that he has ran out of his oxycodone 10-379m tablets. PDMP shows he picked up 90 tablets 10/31. After discussion he is not sure if this is the medication he is out of. He is also taking morphine 349mtwice daily. Fill date was also 10/31. He continues to drive without difficulty. No obvious concerns per his wife. He is more irritable. He feels that his wife is always questioning him and feels that "she always has to be right". He shugs when asked if he feels anxious or depressed. He seems to sleep well. Appetite is good. No gait changes or falls.    HISTORY (copied from Michael AtGuadelupe Sabinrevious note) Mr. GrGarrettes a [age]ear old right-handed gentleman with an underlying complex medical history of coronary artery disease with status post stenting, paroxysmal A. fib with status post cardioversion in 2018, chronic rhinitis, reflux disease, diabetes, migraine headaches, sleep apnea, on CPAP therapy, hyperlipidemia, hypertension, allergies, arthritis, low back pain, on chronic narcotic pain medication, and overweight state, who Presents for follow-up consultation of his memory loss, after interim neuropsychological evaluation.  The patient is accompanied by his wife today.  I first met him at the request of his primary care physician on 07/16/2020, at which time his wife reported concern about his short-term memory for the past 2 to 3 years.  He was repeating himself, forgetting conversations and asking the same question over again.  He had blood work through his primary care physician.  His MMSE during her first office visit was 26 out of 30.  He was advised to proceed with a brain MRI and he was advised to proceed with neuropsychological evaluation.   He was found to have an irregular heartbeat.  I suggested sooner than scheduled follow-up with his cardiologist.   REVIEW OF SYSTEMS: Out of a complete 14 Hartman review of symptoms, the patient complains only of the following symptoms, short term memory loss, irritability,  and all other reviewed systems are negative.   ALLERGIES: Allergies  Allergen Reactions   Ambien [Zolpidem Tartrate]     Amnesia, odd behavior   Amoxicillin Rash   Penicillins Rash    Child hood     HOME MEDICATIONS: Outpatient Medications Prior to Visit  Medication Sig Dispense Refill   acetaminophen (TYLENOL) 500 MG tablet Take 1,000 mg by mouth every 6 (six) hours as needed for mild pain or headache.     atorvastatin (LIPITOR) 80 MG tablet TAKE 1 TABLET BY MOUTH  DAILY 90 tablet 0   baclofen (LIORESAL) 10 MG tablet Take 10 mg by mouth 3 (three) times daily.     diltiazem (CARDIZEM CD) 180 MG 24 hr capsule  Take 180 mg by mouth daily.     docusate sodium (COLACE) 100 MG capsule Take 100 mg by mouth in the morning.     ELIQUIS 5 MG TABS tablet TAKE 1 TABLET BY MOUTH  TWICE DAILY 180 tablet 3   FARXIGA 10 MG TABS tablet Take 10 mg by mouth in the morning.     fluticasone (FLONASE) 50 MCG/ACT nasal spray Place 1 spray into both nostrils at bedtime.     metFORMIN (GLUCOPHAGE) 1000 MG tablet Take 1,000 mg by mouth 2 (two) times daily.     methocarbamol (ROBAXIN) 500 MG tablet Take 500 mg by mouth 2 (two) times daily as needed for muscle spasms.     metoprolol succinate (TOPROL-XL) 100 MG 24 hr tablet TAKE 1 TABLET BY MOUTH  DAILY WITH OR IMMEDIATELY  FOLLOWING A MEAL 90 tablet 1   mirtazapine (REMERON) 45 MG tablet Take 45 mg by mouth at bedtime.     Multiple Vitamin (MULTIVITAMIN WITH MINERALS) TABS tablet Take 1 tablet by mouth in the morning.     nitroGLYCERIN (NITROSTAT) 0.4 MG SL tablet Place 1 tablet (0.4 mg total) under the tongue every 5 (five) minutes as needed for chest pain. 25 tablet 11   ONE TOUCH ULTRA  TEST test strip 1 each by Other route as needed. Use 1 strip to check glucose daily 100 each 4   tiZANidine (ZANAFLEX) 2 MG tablet Take 2 mg by mouth 3 (three) times daily as needed.     TRADJENTA 5 MG TABS tablet Take 5 mg by mouth in the morning.     ALPRAZolam (XANAX) 0.5 MG tablet Take one to 2 tablets 1 hour prior to MRI, may take another as needed. (Patient not taking: Reported on 10/16/2021) 3 tablet 0   morphine (KADIAN) 30 MG 24 hr capsule Take 30 mg by mouth every 12 (twelve) hours.     Olmesartan-amLODIPine-HCTZ 40-5-12.5 MG TABS Take 1 tablet by mouth daily. (Patient not taking: Reported on 10/16/2021)     oxyCODONE-acetaminophen (PERCOCET) 10-325 MG per tablet Take 1 tablet by mouth every 6 (six) hours as needed for pain. (Patient not taking: Reported on 10/16/2021)     No facility-administered medications prior to visit.     PAST MEDICAL HISTORY: Past Medical History:  Diagnosis Date   Arthritis    Atrial fibrillation    Atrial flutter    a. diagnosed in 11/2016 --> started on Eliquis for anticoagulation b. s/p DCCV in 01/2017   CAD (coronary artery disease) 08/02/2007   Celiac disease    Chronic rhinitis 08/02/2007   Coronary artery disease    a. s/p remote stenting of RCA in 1998   Diabetes mellitus type 2 03/23/2012   Essential hypertension 08/02/2007   Gastro-esophageal reflux disease with esophagitis 09/19/2020   History of migraine headaches    MCI (mild cognitive impairment) 09/19/2020   OSA (obstructive sleep apnea)    uses CPAP regularly   Pure hypercholesterolemia 08/02/2007   Secondary hypercoagulable state 07/26/2020     PAST SURGICAL HISTORY: Past Surgical History:  Procedure Laterality Date   ANKLE ARTHROPLASTY     ATRIAL FIBRILLATION ABLATION N/A 10/24/2020   Procedure: ATRIAL FIBRILLATION ABLATION;  Surgeon: Constance Haw, MD;  Location: [facility];  Service: Cardiovascular;  Laterality: N/A;   CARDIAC CATHETERIZATION  06/06/1997   EF  65%   CARDIOVASCULAR STRESS TEST  12/07/2007   EF 58%   CARDIOVERSION N/A 01/08/2017   Procedure: CARDIOVERSION;  Surgeon:  Josue Hector, MD;  Location: [facility];  Service: Cardiovascular;  Laterality: N/A;   CARDIOVERSION N/A 08/01/2020   Procedure: CARDIOVERSION;  Surgeon: Elouise Munroe, MD;  Location: [facility] ENDOSCOPY;  Service: Cardiovascular;  Laterality: N/A;   CORONARY STENT PLACEMENT     ELBOW ARTHROPLASTY     EYE SURGERY     KNEE ARTHROPLASTY     NASAL SINUS SURGERY     SHOULDER ACROMIOPLASTY     SPINE SURGERY       FAMILY HISTORY: Family History  Problem Relation Age of Onset   Hypertension Mother    Diabetes Mother    Alzheimer's disease Mother    Stroke Father    Hypertension Father    Hypertension Brother      SOCIAL HISTORY: Social History   Socioeconomic History   Marital status: Married    Spouse name: Michael Hartman   Number of children: 0   Years of education: 16   Highest education level: Bachelor's degree (e.g., BA, AB, BS)  Occupational History   Occupation: Retired    Fish farm manager: [employer]    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   Social Determinants of Health   Financial Resource Strain: Not on file  Food Insecurity: Not on file  Transportation Needs: Not on file  Physical Activity: Not on file  Stress: Not on file  Social Connections: Not on file  Intimate Partner Violence: Not on file     PHYSICAL EXAM  Vitals:   01/15/22 1351  BP: (!) 162/89  Pulse: 66  Weight: 251 lb  (113.9 kg)  Height: 6' 2"  (1.88 m)    Body mass index is 32.23 kg/m.  Generalized: Well developed, in no acute distress  Cardiology: normal rate and rhythm, no murmur auscultated  Respiratory: clear to auscultation bilaterally    Neurological examination  Mentation: Alert oriented to time, place, history taking. Follows all commands speech and language fluent Cranial nerve II-XII: Pupils were equal round reactive to light. Extraocular movements were full, visual field were full on confrontational test. Facial sensation and strength were normal. Head turning and shoulder shrug  were normal and symmetric. Motor: The motor testing reveals 5 over 5 strength of all 4 extremities. Good symmetric motor tone is noted throughout.  Gait and station: Gait is normal.    DIAGNOSTIC DATA (LABS, IMAGING, TESTING) - I reviewed patient records, labs, notes, testing and imaging myself where available.  Lab Results  Component Value Date   WBC 4.3 10/15/2021   HGB 12.3 (L) 10/15/2021   HCT 37.6 (L) 10/15/2021   MCV 92.2 10/15/2021   PLT 236 10/15/2021      Component Value Date/Time   NA 143 10/15/2021 1949   NA 142 10/04/2020 1037   K 3.7 10/15/2021 1949   CL 110 10/15/2021 1949   CO2 27 10/15/2021 1949   GLUCOSE 130 (H) 10/15/2021 1949   BUN 17 10/15/2021  1949   BUN 16 10/04/2020 1037   CREATININE 0.95 10/15/2021 1949   CALCIUM 9.3 10/15/2021 1949   PROT 6.4 (L) 10/15/2021 1949   PROT 7.1 11/18/2016 1033   ALBUMIN 3.8 10/15/2021 1949   ALBUMIN 4.6 11/18/2016 1033   AST 26 10/15/2021 1949   ALT 21 10/15/2021 1949   ALKPHOS 52 10/15/2021 1949   BILITOT 1.3 (H) 10/15/2021 1949   BILITOT 0.3 11/18/2016 1033   GFRNONAA >60 10/15/2021 1949   GFRAA 98 01/05/2017 1543   Lab Results  Component Value Date   CHOL 136 11/18/2016   HDL 39 (L) 11/18/2016   LDLCALC 54 11/18/2016   TRIG 217 (H) 11/18/2016   CHOLHDL 3.5 11/18/2016   No results found for: "HGBA1C" No results found for:  "VITAMINB12" Lab Results  Component Value Date   TSH 1.479 10/15/2021       01/15/2022    1:57 PM 09/12/2021    8:15 AM 04/24/2021    1:12 PM  MMSE - Mini Mental State Exam  Orientation to time 4 0 0  Orientation to Place 5 5 4   Registration 3 3 3   Attention/ Calculation 5 4 3   Recall 3 0 0  Language- name 2 objects 2 2 2   Language- repeat 1 1 1   Language- follow 3 step command 3 3 3   Language- read & follow direction 1 1 0  Write a sentence 1 1 1   Copy design 1 1 1   Total score 29 21 18          No data to display           ASSESSMENT AND PLAN  66 y.o. year old male  has a past medical history of Arthritis, Atrial fibrillation, Atrial flutter, CAD (coronary artery disease) (08/02/2007), Celiac disease, Chronic rhinitis (08/02/2007), Coronary artery disease, Diabetes mellitus type 2 (03/23/2012), Essential hypertension (08/02/2007), Gastro-esophageal reflux disease with esophagitis (09/19/2020), History of migraine headaches, MCI (mild cognitive impairment) (09/19/2020), OSA (obstructive sleep apnea), Pure hypercholesterolemia (08/02/2007), and Secondary hypercoagulable state (07/26/2020). here with    Cognitive deficits  Memory loss  OSA on CPAP  Michael Hartman continues to note short term memory deficits, however, hallucinations and delusional behaviors have resolved since stopping oxycodone and morphine. Previous neurocognitive testing concerning for MCI with possible dementia but results variable do to low effort. I am concerned that anxiety and depression could be contributing. I have discussed option of psychology referral but he is not interested at this time. He does not appear to be having significant difficulty with driving. We will continue to monitor closely. He was encouraged to continue CPAP therapy as directed. Healthy lifestyle habits encouraged. Memory compensation strategies advised. He will follow up with [place] as needed.     I spent 30 minutes of face-to-face and  non-face-to-face time with patient.  This included previsit chart review, lab review, study review, order entry, electronic health record documentation, patient education.    No orders of the defined types were placed in this encounter.     No orders of the defined types were placed in this encounter.    Debbora Presto, MSN, FNP-C 01/15/2022, 4:05 PM  [facility] [address] [place],  [address] [phone]

## 2022-01-15 NOTE — Patient Instructions (Signed)
Below is our plan:  We will continue to monitor. Consider talking to a counselor.   Please make sure you are staying well hydrated. I recommend 50-60 ounces daily. Well balanced diet and regular exercise encouraged. Consistent sleep schedule with 6-8 hours recommended.   Please continue follow up with care team as directed.   Follow up with me as needed   You may receive a survey regarding today's visit. I encourage you to leave honest feed back as I do use this information to improve patient care. Thank you for seeing me today!   Management of Memory Problems   There are some general things you can do to help manage your memory problems.  Your memory may not in fact recover, but by using techniques and strategies you will be able to manage your memory difficulties better.   1)  Establish a routine. Try to establish and then stick to a regular routine.  By doing this, you will get used to what to expect and you will reduce the need to rely on your memory.  Also, try to do things at the same time of day, such as taking your medication or checking your calendar first thing in the morning. Think about think that you can do as a part of a regular routine and make a list.  Then enter them into a daily planner to remind you.  This will help you establish a routine.   2)  Organize your environment. Organize your environment so that it is uncluttered.  Decrease visual stimulation.  Place everyday items such as keys or cell phone in the same place every day (ie.  Basket next to front door) Use post it notes with a brief message to yourself (ie. Turn off light, lock the door) Use labels to indicate where things go (ie. Which cupboards are for food, dishes, etc.) Keep a notepad and pen by the telephone to take messages   3)  Memory Aids A diary or journal/notebook/daily planner Making a list (shopping list, chore list, to do list that needs to be done) Using an alarm as a reminder (kitchen timer or  cell phone alarm) Using cell phone to store information (Notes, Calendar, Reminders) Calendar/White board placed in a prominent position Post-it notes   In order for memory aids to be useful, you need to have good habits.  It's no good remembering to make a note in your journal if you don't remember to look in it.  Try setting aside a certain time of day to look in journal.   4)  Improving mood and managing fatigue. There may be other factors that contribute to memory difficulties.  Factors, such as anxiety, depression and tiredness can affect memory. Regular gentle exercise can help improve your mood and give you more energy. Simple relaxation techniques may help relieve symptoms of anxiety Try to get back to completing activities or hobbies you enjoyed doing in the past. Learn to pace yourself through activities to decrease fatigue. Find out about some local support groups where you can share experiences with others. Try and achieve 7-8 hours of sleep at night.

## 2022-02-13 ENCOUNTER — Other Ambulatory Visit: Payer: Self-pay | Admitting: Cardiology

## 2022-02-13 DIAGNOSIS — I480 Paroxysmal atrial fibrillation: Secondary | ICD-10-CM

## 2022-02-18 ENCOUNTER — Ambulatory Visit: Payer: Medicare Other | Attending: Cardiology | Admitting: Cardiology

## 2022-02-18 ENCOUNTER — Encounter: Payer: Self-pay | Admitting: Cardiology

## 2022-02-18 VITALS — BP 132/80 | HR 78 | Ht 74.0 in | Wt 258.8 lb

## 2022-02-18 DIAGNOSIS — I480 Paroxysmal atrial fibrillation: Secondary | ICD-10-CM

## 2022-02-18 DIAGNOSIS — D6869 Other thrombophilia: Secondary | ICD-10-CM | POA: Diagnosis not present

## 2022-02-18 NOTE — Progress Notes (Signed)
Electrophysiology Office Note   Date:  02/18/2022   ID:  Neyland, Pettengill 10/20/1955, MRN [id number]  PCP:  Haywood Pao, MD  Cardiologist:  [place] Primary Electrophysiologist:  Constance Haw, MD    Chief Complaint: AFl   History of Present Illness: CASMIR AUGUSTE is a 66 y.o. male who is being seen today for the evaluation of atrial flutter at the request of [name], [name], MD. Presenting today for electrophysiology evaluation.  Has a history significant for obstructive sleep apnea, hypertension, diabetes, hyperlipidemia, coronary artery disease, celiac disease, atrial fibrillation, atrial flutter.  He is status post ablation for atrial fibrillation/flutter on 10/24/2020.  Today, denies symptoms of palpitations, chest pain, shortness of breath, orthopnea, PND, lower extremity edema, claudication, dizziness, presyncope, syncope, bleeding, or neurologic sequela. The patient is tolerating medications without difficulties.  Since being seen he has done well.  He has noted no further atrial arrhythmias.  He is overall quite happy with his control.   Past Medical History:  Diagnosis Date   Arthritis    Atrial fibrillation    Atrial flutter    a. diagnosed in 11/2016 --> started on Eliquis for anticoagulation b. s/p DCCV in 01/2017   CAD (coronary artery disease) 08/02/2007   Celiac disease    Chronic rhinitis 08/02/2007   Coronary artery disease    a. s/p remote stenting of RCA in 1998   Diabetes mellitus type 2 03/23/2012   Essential hypertension 08/02/2007   Gastro-esophageal reflux disease with esophagitis 09/19/2020   History of migraine headaches    MCI (mild cognitive impairment) 09/19/2020   OSA (obstructive sleep apnea)    uses CPAP regularly   Pure hypercholesterolemia 08/02/2007   Secondary hypercoagulable state 07/26/2020   Past Surgical History:  Procedure Laterality Date   ANKLE ARTHROPLASTY     ATRIAL FIBRILLATION ABLATION N/A 10/24/2020    Procedure: ATRIAL FIBRILLATION ABLATION;  Surgeon: Constance Haw, MD;  Location: [place] [facility];  Service: Cardiovascular;  Laterality: N/A;   CARDIAC CATHETERIZATION  06/06/1997   EF 65%   CARDIOVASCULAR STRESS TEST  12/07/2007   EF 58%   CARDIOVERSION N/A 01/08/2017   Procedure: CARDIOVERSION;  Surgeon: Josue Hector, MD;  Location: [place];  Service: Cardiovascular;  Laterality: N/A;   CARDIOVERSION N/A 08/01/2020   Procedure: CARDIOVERSION;  Surgeon: Elouise Munroe, MD;  Location: [facility];  Service: Cardiovascular;  Laterality: N/A;   CORONARY STENT PLACEMENT     ELBOW ARTHROPLASTY     EYE SURGERY     KNEE ARTHROPLASTY     NASAL SINUS SURGERY     SHOULDER ACROMIOPLASTY     SPINE SURGERY       Current Outpatient Medications  Medication Sig Dispense Refill   acetaminophen (TYLENOL) 500 MG tablet Take 1,000 mg by mouth every 6 (six) hours as needed for mild pain or headache.     atorvastatin (LIPITOR) 80 MG tablet TAKE 1 TABLET BY MOUTH  DAILY 90 tablet 0   baclofen (LIORESAL) 10 MG tablet Take 10 mg by mouth 3 (three) times daily.     diltiazem (CARDIZEM CD) 180 MG 24 hr capsule TAKE 1 CAPSULE BY MOUTH EVERY DAY 90 capsule 3   docusate sodium (COLACE) 100 MG capsule Take 100 mg by mouth in the morning.     ELIQUIS 5 MG TABS tablet TAKE 1 TABLET BY MOUTH  TWICE DAILY 180 tablet 3   FARXIGA 10 MG TABS tablet Take 10 mg by  mouth in the morning.     fluticasone (FLONASE) 50 MCG/ACT nasal spray Place 1 spray into both nostrils at bedtime.     metFORMIN (GLUCOPHAGE) 1000 MG tablet Take 1,000 mg by mouth 2 (two) times daily.     methocarbamol (ROBAXIN) 500 MG tablet Take 500 mg by mouth 2 (two) times daily as needed for muscle spasms.     metoprolol succinate (TOPROL-XL) 100 MG 24 hr tablet TAKE 1 TABLET BY MOUTH  DAILY WITH OR IMMEDIATELY  FOLLOWING A MEAL 90 tablet 1   mirtazapine (REMERON) 45 MG tablet Take 45 mg by mouth at bedtime.     Multiple Vitamin  (MULTIVITAMIN WITH MINERALS) TABS tablet Take 1 tablet by mouth in the morning.     nitroGLYCERIN (NITROSTAT) 0.4 MG SL tablet Place 1 tablet (0.4 mg total) under the tongue every 5 (five) minutes as needed for chest pain. 25 tablet 11   ONE TOUCH ULTRA TEST test strip 1 each by Other route as needed. Use 1 strip to check glucose daily 100 each 4   tiZANidine (ZANAFLEX) 2 MG tablet Take 2 mg by mouth 3 (three) times daily as needed.     TRADJENTA 5 MG TABS tablet Take 5 mg by mouth in the morning.     No current facility-administered medications for this visit.    Allergies:   Ambien [zolpidem tartrate], Amoxicillin, and Penicillins   Social History:  The patient  reports that he quit smoking about 36 years ago. His smoking use included cigarettes. He has a 80.00 pack-year smoking history. He has quit using smokeless tobacco. He reports that he does not currently use drugs. He reports that he does not drink alcohol.   Family History:  The patient's family history includes Alzheimer's disease in his mother; Diabetes in his mother; Hypertension in his brother, father, and mother; Stroke in his father.   ROS:  Please see the history of present illness.   Otherwise, review of systems is positive for none.   All other systems are reviewed and negative.   PHYSICAL EXAM: VS:  BP 132/80   Pulse 78   Ht 6' 2"  (1.88 m)   Wt 258 lb 12.8 oz (117.4 kg)   SpO2 94%   BMI 33.23 kg/m  , BMI Body mass index is 33.23 kg/m. GEN: Well nourished, well developed, in no acute distress  HEENT: normal  Neck: no JVD, carotid bruits, or masses Cardiac: RRR; no murmurs, rubs, or gallops,no edema  Respiratory:  clear to auscultation bilaterally, normal work of breathing GI: soft, nontender, nondistended, + BS MS: no deformity or atrophy  Skin: warm and dry Neuro:  Strength and sensation are intact Psych: euthymic mood, full affect  EKG:  EKG is ordered today. Personal review of the ekg ordered shows sinus  rhythm, rate 78  Recent Labs: 10/15/2021: ALT 21; BUN 17; Creatinine, Ser 0.95; Hemoglobin 12.3; Platelets 236; Potassium 3.7; Sodium 143; TSH 1.479    Lipid Panel     Component Value Date/Time   CHOL 136 11/18/2016 1030   TRIG 217 (H) 11/18/2016 1030   HDL 39 (L) 11/18/2016 1030   CHOLHDL 3.5 11/18/2016 1030   LDLCALC 54 11/18/2016 1030     Wt Readings from Last 3 Encounters:  02/18/22 258 lb 12.8 oz (117.4 kg)  01/15/22 251 lb (113.9 kg)  10/17/21 200 lb (90.7 kg)      Other studies Reviewed: Additional studies/ records that were reviewed today include: TTE 08/10/20  Review of  the above records today demonstrates:   1. Left ventricular ejection fraction, by estimation, is 60 to 65%. The  left ventricle has normal function. The left ventricle has no regional  wall motion abnormalities. Left ventricular diastolic parameters are  indeterminate.   2. Right ventricular systolic function is normal. The right ventricular  size is mildly enlarged. Tricuspid regurgitation signal is inadequate for  assessing PA pressure.   3. Left atrial size was moderately dilated.   4. Right atrial size was moderately dilated.   5. The mitral valve is normal in structure. Trivial mitral valve  regurgitation. No evidence of mitral stenosis.   6. The aortic valve is tricuspid. Aortic valve regurgitation is not  visualized. No aortic stenosis is present.   7. The inferior vena cava is normal in size with greater than 50%  respiratory variability, suggesting right atrial pressure of 3 mmHg.    ASSESSMENT AND PLAN:  1.  Paroxysmal atrial fibrillation/typical atrial flutter: CHA2DS2-VASc of 4.  Currently on Eliquis 5 mg twice daily.  Status post ablation 10/24/2020.  He is remained in sinus rhythm.  He has had no further arrhythmias.  We Thea Holshouser continue with current management.  2.  Obstructive sleep apnea: CPAP compliance encouraged  3.  Hypertension: Elevated today.  Normal at home.  No changes.   Blood pressure has improved with recheck.  4.  Coronary artery disease: No current chest pain.  5.  Secondary hypercoagulable state: Currently on Eliquis for atrial fibrillation as above   Current medicines are reviewed at length with the patient today.   The patient does not have concerns regarding his medicines.  The following changes were made today: none  Labs/ tests ordered today include:  Orders Placed This Encounter  Procedures   EKG 12-Lead    Disposition:   FU with Grae Cannata 12 months  Signed, Marvine Encalade Meredith Leeds, MD  02/18/2022 4:19 PM     [address] [place] [address] [phone] (office) [phone] (fax)

## 2022-02-18 NOTE — Patient Instructions (Signed)
Medication Instructions:  Your physician recommends that you continue on your current medications as directed. Please refer to the Current Medication list given to you today.  *If you need a refill on your cardiac medications before your next appointment, please call your pharmacy*   Lab Work: None ordered   Testing/Procedures: None ordered   Follow-Up: At [facility], you and your health needs are our priority.  As part of our continuing mission to provide you with exceptional heart care, we have created designated Provider Care Teams.  These Care Teams include your primary Cardiologist (physician) and Advanced Practice Providers (APPs -  Physician Assistants and Nurse Practitioners) who all work together to provide you with the care you need, when you need it.  Your next appointment:   1 year(s)  The format for your next appointment:   In Person  Provider:   Allegra Lai, MD    Thank you for choosing [facility]!!   Trinidad Curet, RN [phone]  Other Instructions   Important Information About Sugar

## 2022-04-01 NOTE — Progress Notes (Signed)
Cardiology Office Note    Date:  04/15/2022   ID:  Brendan, Gadson 06/19/1955, MRN [id number]  PCP:  Haywood Pao, MD  Cardiologist: Dr. [place]   Chief Complaint  Patient presents with   Coronary Artery Disease   Atrial Flutter    History of Present Illness:    Michael Hartman is a 66 y.o. male with past medical history of CAD (s/p stenting of RCA in 1998), PAF (on Eliquis), HTN, HLD, Type 2 DM, and OSA (on CPAP) who presents to the office today for follow-up of atrial fibrillation and CAD.   He was diagnosed with Afib in June of 2018. Despite good rate control he remained symptomatic with fatigue.  He underwent DCCV x2 with 150J and 200J with conversion to NSR in August 2018.   He was found to be back in atrial flutter 07/16/2020.  He had increased fatigue.  He is now status post cardioversion 08/01/2020. He was seen by Dr Curt Bears on March 23 and was back in Afib.  There were no specific triggers that he could identify. Patient underwent DCCV on 08/01/20 which converted him from atrial flutter to afib. He was seen by Dr Curt Bears 08/28/20 and underwent afib and flutter ablation on 10/24/20.  On follow up today he denies any chest pain, palpitations, dyspnea or dizziness. He has no chest pain. He did lose 30 lbs last year but has gained 15 lbs back. Now retired. Doesn't monitor BP at home.   Past Medical History:  Diagnosis Date   Arthritis    Atrial fibrillation    Atrial flutter    a. diagnosed in 11/2016 --> started on Eliquis for anticoagulation b. s/p DCCV in 01/2017   CAD (coronary artery disease) 08/02/2007   Celiac disease    Chronic rhinitis 08/02/2007   Coronary artery disease    a. s/p remote stenting of RCA in 1998   Diabetes mellitus type 2 03/23/2012   Essential hypertension 08/02/2007   Gastro-esophageal reflux disease with esophagitis 09/19/2020   History of migraine headaches    MCI (mild cognitive impairment) 09/19/2020   OSA (obstructive sleep apnea)     uses CPAP regularly   Pure hypercholesterolemia 08/02/2007   Secondary hypercoagulable state 07/26/2020    Past Surgical History:  Procedure Laterality Date   ANKLE ARTHROPLASTY     ATRIAL FIBRILLATION ABLATION N/A 10/24/2020   Procedure: ATRIAL FIBRILLATION ABLATION;  Surgeon: Constance Haw, MD;  Location: [place] [facility];  Service: Cardiovascular;  Laterality: N/A;   CARDIAC CATHETERIZATION  06/06/1997   EF 65%   CARDIOVASCULAR STRESS TEST  12/07/2007   EF 58%   CARDIOVERSION N/A 01/08/2017   Procedure: CARDIOVERSION;  Surgeon: Josue Hector, MD;  Location: [place];  Service: Cardiovascular;  Laterality: N/A;   CARDIOVERSION N/A 08/01/2020   Procedure: CARDIOVERSION;  Surgeon: Elouise Munroe, MD;  Location: [facility];  Service: Cardiovascular;  Laterality: N/A;   CORONARY STENT PLACEMENT     ELBOW ARTHROPLASTY     EYE SURGERY     KNEE ARTHROPLASTY     NASAL SINUS SURGERY     SHOULDER ACROMIOPLASTY     SPINE SURGERY      Current Medications: Outpatient Medications Prior to Visit  Medication Sig Dispense Refill   acetaminophen (TYLENOL) 500 MG tablet Take 1,000 mg by mouth every 6 (six) hours as needed for mild pain or headache.     baclofen (LIORESAL) 10 MG tablet Take 10 mg by mouth  3 (three) times daily.     diltiazem (CARDIZEM CD) 180 MG 24 hr capsule TAKE 1 CAPSULE BY MOUTH EVERY DAY 90 capsule 3   docusate sodium (COLACE) 100 MG capsule Take 100 mg by mouth in the morning.     ELIQUIS 5 MG TABS tablet TAKE 1 TABLET BY MOUTH  TWICE DAILY 180 tablet 3   FARXIGA 10 MG TABS tablet Take 10 mg by mouth in the morning.     fluticasone (FLONASE) 50 MCG/ACT nasal spray Place 1 spray into both nostrils at bedtime.     metFORMIN (GLUCOPHAGE) 1000 MG tablet Take 1,000 mg by mouth 2 (two) times daily.     methocarbamol (ROBAXIN) 500 MG tablet Take 500 mg by mouth 2 (two) times daily as needed for muscle spasms.     metoprolol succinate (TOPROL-XL) 100 MG 24 hr tablet  TAKE 1 TABLET BY MOUTH  DAILY WITH OR IMMEDIATELY  FOLLOWING A MEAL 90 tablet 1   mirtazapine (REMERON) 45 MG tablet Take 45 mg by mouth at bedtime.     Multiple Vitamin (MULTIVITAMIN WITH MINERALS) TABS tablet Take 1 tablet by mouth in the morning.     nitroGLYCERIN (NITROSTAT) 0.4 MG SL tablet Place 1 tablet (0.4 mg total) under the tongue every 5 (five) minutes as needed for chest pain. 25 tablet 11   ONE TOUCH ULTRA TEST test strip 1 each by Other route as needed. Use 1 strip to check glucose daily 100 each 4   tiZANidine (ZANAFLEX) 2 MG tablet Take 2 mg by mouth 3 (three) times daily as needed.     TRADJENTA 5 MG TABS tablet Take 5 mg by mouth in the morning.     atorvastatin (LIPITOR) 80 MG tablet TAKE 1 TABLET BY MOUTH  DAILY 90 tablet 0   No facility-administered medications prior to visit.     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   Social Determinants of Health   Financial Resource Strain: Not on file  Food Insecurity: Not on file  Transportation Needs: Not on file  Physical Activity: Not on file  Stress: Not on file  Social Connections: Not on file     Family History:   The patient's family history includes Diabetes in his mother; Hypertension in his brother, father, and mother; Stroke in his father.   Review of Systems:   Please see the history of present illness.    All other systems reviewed and are otherwise negative except as noted above.   Physical Exam:  VS:  BP (!) 158/80   Pulse 74   Ht 6' 2"  (1.88 m)   Wt 230 lb (104.3 kg)   SpO2 98%   BMI 29.53 kg/m    GENERAL:  Well appearing WM in NAD HEENT:  PERRL, EOMI, sclera are clear. Oropharynx is clear. NECK:  No jugular venous distention, carotid upstroke brisk and symmetric, no bruits, no thyromegaly or adenopathy LUNGS:  Clear to auscultation bilaterally CHEST:  Unremarkable HEART:  IRRR,  PMI not displaced or sustained,S1 and S2 within normal limits, no S3, no S4: no clicks, no rubs, no murmurs ABD:  Soft, nontender. BS +, no masses or bruits. No hepatomegaly, no splenomegaly EXT:  2 + pulses throughout, no edema, no cyanosis no clubbing SKIN:  Warm and dry.  No rashes NEURO:  Alert and oriented x 3. Cranial nerves II through XII intact. PSYCH:  Cognitively intact    Wt Readings from Last 3 Encounters:  04/15/22 230 lb (104.3 kg)  02/18/22 258 lb 12.8 oz (117.4 kg)  01/15/22 251 lb (113.9 kg)     Studies/Labs Reviewed:   Recent Labs: 10/15/2021: ALT 21; BUN 17; Creatinine, Ser 0.95; Hemoglobin 12.3; Platelets 236; Potassium 3.7; Sodium 143; TSH 1.479   Lipid Panel    Component Value Date/Time   CHOL 136 11/18/2016 1030   TRIG 217 (H) 11/18/2016 1030   HDL 39 (L) 11/18/2016 1030   CHOLHDL 3.5 11/18/2016 1030   LDLCALC 54 11/18/2016 1030   Dated 03/31/17: A1c 7.8%. Hgb and chemistries normal. Dated 09/29/17: glucose 117, otherwise CBC, CMET and PSA normal. Cholesterol 140, triglycerides 172, HDL 37, LDL 69. Dated 04/14/18: A1c 6.4%. cholesterol 140, Triglycerides 172, HDL 37, LDL 69. Glucose 131, creatinine 1.1. other chemistries, TSH, CBC normal Dated 10/06/18: cholesterol  142, triglycerides 105, HDL 39, LDL 82. A1c 6.6%. CMET and CBC normal.  Dated 10/21/19: cholesterol 116, triglycerides 158, HDL 42, LDL 42.  Dated 04/26/20: A1c 6.6%. CMET normal Dated 12/13/21: cholesterol 134, triglycerides 71, HDL 61, LDL 59. A1c 6.9%, CBC and CMET normal   Additional studies/ records that were reviewed today include:   Echocardiogram: 11/24/2016 Study Conclusions   - Left ventricle: The cavity size was mildly dilated. There was   mild focal basal hypertrophy of the septum. Systolic function was   normal. The estimated ejection fraction was 55%. Wall motion was   normal; there were no regional wall motion abnormalities. - Aortic valve: Trileaflet; mildly thickened, mildly calcified   leaflets. - Mitral valve: Calcified annulus. - Left atrium: The atrium was moderately dilated. - Right ventricle: The cavity size was mildly dilated. Wall   thickness was normal. - Pulmonic valve: There was trivial regurgitation.  Echo 08/10/20: 1. Left ventricular ejection fraction, by estimation, is 60 to 65%. The  left ventricle has normal function. The left ventricle has no regional  wall motion abnormalities. Left ventricular diastolic parameters are  indeterminate.   2. Right ventricular systolic function is normal. The right ventricular  size is mildly enlarged. Tricuspid regurgitation signal is inadequate for  assessing PA pressure.   3. Left atrial size was moderately dilated.   4. Right atrial size was moderately dilated.   5. The mitral valve is normal in structure. Trivial mitral valve  regurgitation. No evidence of mitral stenosis.   6. The aortic valve is tricuspid. Aortic valve regurgitation is not  visualized. No aortic stenosis is present.   7. The inferior vena cava is normal in size with greater than 50%  respiratory variability, suggesting right atrial pressure of 3 mmHg.   Assessment:    1. Paroxysmal atrial fibrillation (HCC)   2. OSA (obstructive sleep  apnea)   3. HYPERCHOLESTEROLEMIA   4. Coronary artery disease involving native coronary artery of native heart without angina pectoris   5. Essential hypertension       Plan:    1. Paroxysmal Atrial Fibrillation/ Use of Long-term Anticoagulation - initially diagnosed with atrial fibrillation back in 11/2016 and is  s/p DCCV on 01/08/2017. - Recurrent Afib now s/p repeat DCCV in Feb. - early recurrence of Afib. - now s/p ablation in May  - will continue Lopressor and Cardizem CD at current doses.  -  Continue Eliquis 10m BID for anticoagulation.  - maintaining NSR  2. CAD - s/p remote stenting of RCA in 1998. - he is asymptomatic.  - continue BB and statin therapy.   3. HTN - BP is  elevated today. Reviewing recent visits with Dr CCurt Bearsand Dr TOsborne CascoBP was 130/78. No change in therapy now. Recommended he get a BP monitor to check BP at home  4. HLD - at goal on lipitor.  - continue Atorvastatin 8106mdaily.   5. OSA - continue CPAP.  - encouraged weight loss.   Follow up in one year.  Signed, Braysen Cloward JoMartiniqueMDEstherwood1/12/2021 9:40 AM    [facility] [address] [id number]: [phone]

## 2022-04-15 ENCOUNTER — Encounter: Payer: Self-pay | Admitting: Cardiology

## 2022-04-15 ENCOUNTER — Ambulatory Visit: Payer: Medicare Other | Attending: Cardiology | Admitting: Cardiology

## 2022-04-15 VITALS — BP 158/80 | HR 74 | Ht 74.0 in | Wt 230.0 lb

## 2022-04-15 DIAGNOSIS — I2510 Atherosclerotic heart disease of native coronary artery without angina pectoris: Secondary | ICD-10-CM

## 2022-04-15 DIAGNOSIS — E7800 Pure hypercholesterolemia, unspecified: Secondary | ICD-10-CM | POA: Diagnosis not present

## 2022-04-15 DIAGNOSIS — I480 Paroxysmal atrial fibrillation: Secondary | ICD-10-CM

## 2022-04-15 DIAGNOSIS — I10 Essential (primary) hypertension: Secondary | ICD-10-CM

## 2022-04-15 DIAGNOSIS — G4733 Obstructive sleep apnea (adult) (pediatric): Secondary | ICD-10-CM | POA: Diagnosis not present

## 2022-04-15 MED ORDER — ATORVASTATIN CALCIUM 80 MG PO TABS: 80.0000 mg | ORAL_TABLET | Freq: Every day | ORAL | 3 refills | Status: AC

## 2022-06-18 ENCOUNTER — Other Ambulatory Visit: Payer: Self-pay | Admitting: Cardiology

## 2022-07-09 ENCOUNTER — Other Ambulatory Visit: Payer: Self-pay | Admitting: Cardiology

## 2022-07-09 DIAGNOSIS — I480 Paroxysmal atrial fibrillation: Secondary | ICD-10-CM

## 2022-07-09 NOTE — Telephone Encounter (Signed)
Prescription refill request for Eliquis received. Indication: AF Last office visit: 04/15/22  P [place] MD Scr: 0.95 on 10/15/21 Age: 67 Weight: 104.3kg  Based on above findings Eliquis 5mg  twice daily is the appropriate dose.  Refill approved.

## 2022-12-01 ENCOUNTER — Other Ambulatory Visit: Payer: Self-pay | Admitting: Cardiology

## 2022-12-01 DIAGNOSIS — I480 Paroxysmal atrial fibrillation: Secondary | ICD-10-CM

## 2022-12-01 NOTE — Telephone Encounter (Signed)
Prescription refill request for Eliquis received. Indication: PAF Last office visit: 04/15/22  P [place] MD Scr: 0.94 on 09/23/22  KPN Age: 67 Weight: 104.3kg  Based on above findings Eliquis 5mg  twice daily is the appropriate dose.  Refill approved.

## 2022-12-04 ENCOUNTER — Institutional Professional Consult (permissible substitution): Payer: Medicare Other | Admitting: Neurology

## 2022-12-04 ENCOUNTER — Telehealth: Payer: Self-pay | Admitting: Neurology

## 2022-12-04 NOTE — Telephone Encounter (Signed)
LVM and sent mychart message informing pt of r/s needed for today's appt- MD out.

## 2022-12-19 ENCOUNTER — Ambulatory Visit
Admission: RE | Admit: 2022-12-19 | Discharge: 2022-12-19 | Disposition: A | Discharge status: Home / Self Care | Payer: Medicare Other | Source: Ambulatory Visit | Attending: Family Medicine | Admitting: Family Medicine

## 2022-12-19 ENCOUNTER — Other Ambulatory Visit: Payer: Self-pay | Admitting: Family Medicine

## 2022-12-19 DIAGNOSIS — M25532 Pain in left wrist: Secondary | ICD-10-CM

## 2023-02-16 ENCOUNTER — Other Ambulatory Visit (HOSPITAL_BASED_OUTPATIENT_CLINIC_OR_DEPARTMENT_OTHER): Payer: Self-pay

## 2023-03-05 ENCOUNTER — Encounter: Payer: Self-pay | Admitting: *Deleted

## 2023-03-09 ENCOUNTER — Ambulatory Visit (INDEPENDENT_AMBULATORY_CARE_PROVIDER_SITE_OTHER): Payer: Medicare Other | Admitting: Neurology

## 2023-03-09 ENCOUNTER — Encounter: Payer: Self-pay | Admitting: Neurology

## 2023-03-09 VITALS — BP 120/74 | HR 61 | Ht 74.0 in | Wt 253.0 lb

## 2023-03-09 DIAGNOSIS — R4189 Other symptoms and signs involving cognitive functions and awareness: Secondary | ICD-10-CM

## 2023-03-09 DIAGNOSIS — F0283 Dementia in other diseases classified elsewhere, unspecified severity, with mood disturbance: Secondary | ICD-10-CM

## 2023-03-09 MED ORDER — DONEPEZIL HCL 10 MG PO TABS: 10.0000 mg | ORAL_TABLET | Freq: Every day | ORAL | 3 refills | Status: DC

## 2023-03-09 NOTE — Progress Notes (Signed)
Subjective:    Patient ID: Michael Hartman is a 67 y.o. male.  HPI    Interim history:  Michael Hartman is a 67 year old right-handed gentleman with an underlying complex medical history of coronary artery disease with status post stenting, paroxysmal A. fib with status post cardioversion in 2018, chronic rhinitis, reflux disease, diabetes, migraine headaches, sleep apnea (on PAP therapy, followed by [facility]), hyperlipidemia, hypertension, allergies, arthritis, low back pain, on chronic narcotic pain medication, and overweight state, who presents for follow-up consultation and reevaluation of his memory loss. The patient is accompanied by his wife today.  He is referred by his primary care physician, Michael Hartman, I reviewed an office visit note from 09/23/2022.  He saw Michael Dapper, NP on 01/15/2022, at which time his MMSE was 29.  His delusions and hallucinations improved after he stopped taking narcotic pain medications.  Anxiety and depression were felt to be contributors to his cognitive dysfunction.  He declined a referral to psychology.  He was advised to follow-up as needed.  Today, 03/09/2023: He reports no significant difficulty with his memory.  He does not feel that there has been much difference in the past year, his wife reports that he has been more forgetful.  He may forget a conversation, still does his own ADLs and also drives but usually only to familiar places.  He no longer sees Dr. Wylene Hartman.  He reports that he "fired him" and did not want to see anybody else in his office either. Wife reports mood irritability.  He reports full compliance with his CPAP machine.   He reports drinking liquids, likes to drink diet soda, 2 bottles or 2 cans/day.  He drinks some Soda. He does not drink any alcohol, he quit smoking many years ago.  Mom had memory loss, she lived to be early 67s.    He saw his primary care physician on 09/23/2022 and I reviewed the note.  He had blood work at the  time including vitamin B12, folate, CBC with differential, CMP, lipid panel, A1c, and TSH.  I reviewed blood test results in her paper chart, lipid panel showed elevated cholesterol of 295, triglycerides elevated at 280, LDL elevated at 195.  CMP showed elevated glucose at 205, BUN 14, creatinine 0.94.  TSH normal at 1.36. Of note, he had a brain MRI with and without contrast on 10/14/2021 and I reviewed the results:  IMPRESSION: 1. No acute intracranial abnormality. 2. Minimal chronic small vessel ischemic disease and mild cerebral atrophy.  They were notified by phone call.    He had an EEG through our office on 09/24/2021 and I reviewed the results: Impression: This is an abnormal EEG recording in the waking and drowsy state due to mild diffuse slowing. Diffuse slowing is consistent with a generalized brain dysfunction, nonspecific.     They were notified by phone call.  Previously:  I saw him on 09/12/21, at which time he was still on narcotic pain medications.  His wife was concerned that there were some discrepancies in the way he took his pain medications.  He was status post A-fib ablation in May 2022.  His MMSE was 21 at the time. He was on multiple psychotropic and neurotropic medications including Robaxin, Remeron, long-acting morphine sulfate, oxycodone, and Zanaflex.   He saw Michael Dapper, NP on 04/24/21, at which time his MMSE was 18 out of 30, he reported more mood related issues including anxiety and depression and irritability.  He was advised  to follow-up with primary care regarding management of these.  There was some concern about adherence to his pain medication regimen correctly.  His wife emailed in the interim a few times reporting ongoing concern with patient not taking his narcotic pain medication correctly.      I first met him at the request of his primary care physician on 07/16/2020, at which time his wife reported concern about his short-term memory for the past 2 to 3  years.  He was repeating himself, forgetting conversations and asking the same question over again.  He had blood work through his primary care physician.  His MMSE during her first office visit was 26 out of 30.  He was advised to proceed with a brain MRI and he was advised to proceed with neuropsychological evaluation.  He was found to have an irregular heartbeat.  I suggested sooner than scheduled follow-up with his cardiologist.   He saw Michael Hartman on 09/19/2020 and had a subsequent follow-up appointment for discussion on 09/25/2020.  I reviewed the impression and recommendations:    <<Clinical Impression(s): Scores across stand-alone and embedded performance validity measures were largely below expectation. Additionally, test engagement was poor at times, requiring encouragement from the psychometrist. Michael Hartman commonly stated "I'm blank" or "I'm shutting down and I know it" during testing, often before his participation in a task was discontinued. This certainly could represent increased anxiety and/or frustration surrounding being faced with poor performance across testing which could impact his ability to fully engage. He also exhibited some odd performances, including makes errors when naming colors or reading basic words such as "red" or "blue," which is quite uncommon even in individuals with significant cognitive dysfunction. Overall, as there are some validity concerns surrounding test results, the current evaluation should be interpreted with caution as lower performances have the potential to underestimate true cognitive abilities.   If taken at face value, Michael Hartman pattern of performance is suggestive of prominent impairment surrounding all aspects of learning and memory. Impairments were also exhibited across executive functioning and verbal fluency, while more relative weaknesses were exhibited across working memory and receptive language. Processing speed was variable while  performance was appropriate across basic attention, confrontation naming, and visuospatial abilities. Michael Hartman denied difficulties completing instrumental activities of daily living (ADLs) independently; this was independently confirmed by his wife.    While validity concerns make understanding the true extent of cognitive impairment unable to be determined, I do believe that Mr. Oscar exhibits appreciable cognitive impairment in several domains. This, coupled with reportedly intact ADLs, would suggest he meets diagnostic criteria for a Mild Neurocognitive Disorder ("mild cognitive impairment") at the present time. There is a high likelihood of a significant vascular component to his current presentation given numerous vascular-related medical conditions, especially atrial fibrillation. Additionally, medication side effects, especially muscle relaxants and those related to pain management, could certainly worsen vascular-related deficits. Given test validity concerns, I cannot rule out the presence of an underlying neurodegenerative disease at the present time. In fact, if test performances were taken at face value, Mr. Cregg amnestic memory profile with evidence for memory storage impairment would certainly raise concerns for the presence of Alzheimer's disease. However, future repeat testing will be necessary to obtain more firm diagnostic conclusions. Continued medical monitoring will be important moving forward.   Recommendations:  A repeat neuropsychological evaluation in 18-24 months (or sooner if functional decline is noted) is recommended to assess the trajectory of future cognitive decline should it  occur. This will also aid in future efforts towards improved diagnostic clarity.   Neuroimaging would be beneficial. His wife noted that he attempted to complete a brain MRI but experienced symptoms of claustrophobia and ultimately could not go through with the scan. If he is up for it, he could  attempt to undergo a brain MRI while sedated. If not, even the completion of a head CT would be beneficial.    Appropriate management of cardiovascular conditions, especially atrial fibrillation, will be important in slowing future cognitive decline and lessening his future stroke risk. Ensuring that he continues to use his CPAP machine nightly will also be very important.    I would also recommend that he organize his medications with a pillbox or some other system. Should medications not be taken accurately, this can increase negative medical and cognitive outcomes. I would encourage him to allow his wife to periodically check to ensure that medications are being taken accurately.    Mr. Corkum is encouraged to attend to lifestyle factors for brain health (e.g., regular physical exercise, good nutrition habits, regular participation in cognitively-stimulating activities, and general stress management techniques), which are likely to have benefits for both emotional adjustment and cognition. Continued participation in activities which provide mental stimulation and social interaction is also recommended.    When learning new information, he would benefit from information being broken up into small, manageable pieces. He may also find it helpful to articulate the material in his own words and in a context to promote encoding at the onset of a new task. This material may need to be repeated multiple times to promote encoding.   Memory can be improved using internal strategies such as rehearsal, repetition, chunking, mnemonics, association, and imagery. External strategies such as written notes in a consistently used memory journal, visual and nonverbal auditory cues such as a calendar on the refrigerator or appointments with alarm, such as on a cell phone, can also help maximize recall.     To address problems with processing speed, he may wish to consider:   -Ensuring that he is alerted when essential  material or instructions are being presented   -Allowing additional processing time or a chance to rehearse novel information   -Allowing for more time in comprehending, processing, and responding in conversation   To address problems with fluctuating attention and executive dysfunction, he may wish to consider:   -Avoiding external distractions when needing to concentrate   -Limiting exposure to fast paced environments with multiple sensory demands   -Writing down complicated information and using checklists   -Attempting and completing one task at a time (i.e., no multi-tasking)   -Verbalizing aloud each step of a task to maintain focus   -Reducing the amount of information considered at one time  >>.     His wife called on 07/24/2020 reporting that he was unable to go through with the MRI.      07/16/20: (He) reports very little about his symptoms, his referral was primarily requested by his wife.  She provides most of his history.  She reports concern about his short-term memory for the past 2 to 3 years.  He has a tendency to be forgetful, including forgetting conversations or events.  He is often repeating himself, including repeating questions that were recently discussed.  He has done most of the housework lately because she had open heart surgery.  He drives without difficulty and typically has had no difficulty driving.  He has been compliant  with his CPAP and is followed by Dr. Earl Gala, has an appointment coming up soon.  He reports some forgetfulness.  His wife endorses that patient's mom had a diagnosis of Alzheimer's dementia, she lived to be into her 60s.  Patient is followed by Dr. Manon Hilding for pain management.  He has been on Percocet and morphine for at least 5 years.  He takes Percocet up to 3 times a day and takes morphine twice daily.  He also takes a muscle relaxer, 1 or 2 pills up to 3 times a day, Robaxin.  He quit smoking over 40 years ago.  He has no biological children but has  stepchildren.  He has been married to his wife for 34 years.  He drinks quite a bit of water, estimates that he drinks about 2 to 3 L/day, no alcohol on a regular basis and drinks caffeine in the form of diet Pepsi, about 2/day on average.  He is retired.  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  He does report having sustained head injuries in the past. I reviewed your office note from 04/25/2020.  Had a CMP at the time which showed glucose of 135, creatinine 1.2, BUN 15, A1c 6.6.  He also had additional labs including TSH, RPR and vitamin B12, we will request those test results from your office as well.   He denies any sudden onset of one-sided weakness or numbness or tingling or droopy face or slurring of speech.  Upon further asking if he has been in A. fib.  He reports that at his follow-up appointment with Dr. Manon Hilding he was told that he may be in A. fib.  He has not had any new symptoms such as shortness of breath, palpitations, chest pain.    His Past Medical History Is Significant For: Past Medical History:  Diagnosis Date   Allergies    Arthritis    Atrial fibrillation    Atrial flutter    a. diagnosed in 11/2016 --> started on Eliquis for anticoagulation b. s/p DCCV in 01/2017   CAD (coronary artery disease) 08/02/2007   Celiac disease    Chronic insomnia    Chronic rhinitis 08/02/2007   Coronary artery disease    a. s/p remote stenting of RCA in 1998   Diabetes mellitus type 2 03/23/2012   Erectile dysfunction    Essential hypertension 08/02/2007   Gastro-esophageal reflux disease with esophagitis 09/19/2020   History of migraine headaches    MCI (mild cognitive impairment) 09/19/2020   OSA (obstructive sleep apnea)    uses CPAP regularly   Pure hypercholesterolemia 08/02/2007   Secondary hypercoagulable state 07/26/2020   Sinusitis    Stage 2 chronic kidney disease     His Past Surgical History Is Significant For: Past Surgical  History:  Procedure Laterality Date   ANKLE ARTHROPLASTY     ATRIAL FIBRILLATION ABLATION N/A 10/24/2020   Procedure: ATRIAL FIBRILLATION ABLATION;  Surgeon: Regan Lemming, MD;  Location: [facility] INVASIVE CV LAB;  Service: Cardiovascular;  Laterality: N/A;   CARDIAC CATHETERIZATION  06/06/1997   EF 65%   CARDIOVASCULAR STRESS TEST  12/07/2007   EF 58%   CARDIOVERSION N/A 01/08/2017   Procedure: CARDIOVERSION;  Surgeon: Wendall Stade, MD;  Location: [facility] ENDOSCOPY;  Service: Cardiovascular;  Laterality: N/A;   CARDIOVERSION N/A 08/01/2020   Procedure: CARDIOVERSION;  Surgeon: [name], MD;  Location: [facility] ENDOSCOPY;  Service: Cardiovascular;  Laterality:  N/A;   CORONARY STENT PLACEMENT     ELBOW ARTHROPLASTY     EYE SURGERY     KNEE ARTHROPLASTY     low back surgery     NASAL SINUS SURGERY     REFRACTIVE SURGERY     SHOULDER ACROMIOPLASTY     SPINE SURGERY      His Family History Is Significant For: Family History  Problem Relation Age of Onset   Hypertension Mother    Diabetes Mother    Alzheimer's disease Mother    Gallstones Mother    Ulcers Mother    Arthritis Mother    Stroke Father    Hypertension Father    Heart attack Father    Hypertension Brother     His Social History Is Significant For: Social History   Socioeconomic History   Marital status: Married    Spouse name: Joyce Gross   Number of children: 0   Years of education: 16   Highest education level: Bachelor's degree (e.g., BA, AB, BS)  Occupational History   Occupation: Retired    Associate Professor: [employer]    Comment: DETENTION OFFICER  Tobacco Use   Smoking status: Former    Current packs/day: 0.00    Average packs/day: 4.0 packs/day for 20.0 years (80.0 ttl pk-yrs)    Types: Cigarettes    Start date: 06/09/1965    Quit date: 06/09/1985    Years since quitting: 37.7   Smokeless tobacco: Former  Substance and Sexual Activity   Alcohol use: No   Drug use: Not Currently   Sexual  activity: Yes    Partners: Female    Birth control/protection: None  Other Topics Concern   Not on file  Social History Narrative   Lives with his wife and their cat.   Social Determinants of Health   Financial Resource Strain: Not on file  Food Insecurity: Not on file  Transportation Needs: Not on file  Physical Activity: Not on file  Stress: Not on file  Social Connections: Not on file    His Allergies Are:  Allergies  Allergen Reactions   Ambien [Zolpidem Tartrate]     Amnesia, odd behavior   Amoxicillin Rash   Penicillins Rash    Child hood  :   His Current Medications Are:  Outpatient Encounter Medications as of 03/09/2023  Medication Sig   acetaminophen (TYLENOL) 500 MG tablet Take 1,000 mg by mouth every 6 (six) hours as needed for mild pain or headache.   apixaban (ELIQUIS) 5 MG TABS tablet TAKE 1 TABLET BY MOUTH TWICE  DAILY   atorvastatin (LIPITOR) 80 MG tablet Take 1 tablet (80 mg total) by mouth daily.   diltiazem (CARDIZEM CD) 180 MG 24 hr capsule TAKE 1 CAPSULE BY MOUTH EVERY DAY   docusate sodium (COLACE) 100 MG capsule Take 100 mg by mouth in the morning.   FARXIGA 10 MG TABS tablet Take 10 mg by mouth in the morning.   fluticasone (FLONASE) 50 MCG/ACT nasal spray Place 1 spray into both nostrils at bedtime.   glipiZIDE (GLUCOTROL XL) 10 MG 24 hr tablet Take 10 mg by mouth daily.   metFORMIN (GLUCOPHAGE) 1000 MG tablet Take 1,000 mg by mouth 2 (two) times daily.   methocarbamol (ROBAXIN) 500 MG tablet Take 500 mg by mouth 2 (two) times daily as needed for muscle spasms.   metoprolol succinate (TOPROL-XL) 100 MG 24 hr tablet Take 1 tablet (100 mg total) by mouth daily.   mirtazapine (REMERON) 45  MG tablet Take 45 mg by mouth at bedtime.   Multiple Vitamin (MULTIVITAMIN WITH MINERALS) TABS tablet Take 1 tablet by mouth in the morning.   nitroGLYCERIN (NITROSTAT) 0.4 MG SL tablet Place 1 tablet (0.4 mg total) under the tongue every 5 (five) minutes as needed  for chest pain.   Olmesartan-amLODIPine-HCTZ (TRIBENZOR) 40-5-12.5 MG TABS Take 1 tablet by mouth in the morning.   ONE TOUCH ULTRA TEST test strip 1 each by Other route as needed. Use 1 strip to check glucose daily   tiZANidine (ZANAFLEX) 2 MG tablet Take 2 mg by mouth 3 (three) times daily as needed.   TRADJENTA 5 MG TABS tablet Take 5 mg by mouth in the morning.   baclofen (LIORESAL) 10 MG tablet Take 10 mg by mouth 3 (three) times daily. (Patient not taking: Reported on 03/09/2023)   No facility-administered encounter medications on file as of 03/09/2023.  :  Review of Systems:  Out of a complete 14 point review of systems, all are reviewed and negative with the exception of these symptoms as listed below:  Review of Systems  Neurological:        Patient is here with his wife for memory follow-up. Patient thinks his memory is alright but his wife states his short term memory is terrible. She states he can go to the grocery store, do activities of daily living, driving, but he cannot remember things well that you tell him. MMSE 18/30 Animals 6     Objective:  Neurological Exam  Physical Exam Physical Examination:   Vitals:   03/09/23 1318  BP: 120/74  Pulse: 61    General Examination: The patient is a very pleasant 67 y.o. male in no acute distress. He appears well-developed and well-nourished and well groomed.   HEENT: Normocephalic, atraumatic, pupils are equal, round and reactive to light, no well-preserved, hearing could be mildly impaired, face is symmetric, speech is scant without obvious dysarthria or hypophonia.  He answers in few word sentences only.     Chest: Clear to auscultation without wheezing, rhonchi or crackles noted.   Heart: S1+S2+0, regular, no murmur.     Abdomen: Soft, non-tender and non-distended.   Extremities: There is no obvious edema in the distal lower extremities bilaterally.       Skin: Warm and dry without trophic changes noted.    Musculoskeletal: exam reveals no obvious joint deformities, tenderness or joint swelling or erythema.    Neurologically:  Mental status: The patient is awake, alert but provides limited information. History is heavily supplemented by his wife.     03/09/2023    1:20 PM 01/15/2022    1:57 PM 09/12/2021    8:15 AM 04/24/2021    1:12 PM 07/16/2020   10:26 AM  MMSE - Mini Mental State Exam  Orientation to time 0 4 0 0 2  Orientation to Place 5 5 5 4 4   Registration 3 3 3 3 3   Attention/ Calculation 2 5 4 3 5   Recall 0 3 0 0 3  Language- name 2 objects 2 2 2 2 2   Language- repeat 1 1 1 1 1   Language- follow 3 step command 3 3 3 3 3   Language- read & follow direction 1 1 1  0 1  Write a sentence 0 1 1 1 1   Copy design 1 1 1 1  0  Total score 18 29 21 18 25     On 07/16/2020: CDT: 4/4, AFT: 11/min. On 09/12/2021: CDT: 4/4, AFT:  3/min. On 03/09/2023: CDT: 4/4, AFT: 6/min.   There is no evidence of aphasia, agnosia, apraxia or anomia. Speech is scant without dysarthria or hypophonia.   Not very forthcoming with information, affect appears to be blunted.  No obvious hallucinations.  He answers in few word sentences only.   Cranial nerves II - XII are as described above under HEENT exam.  Motor exam: Normal bulk, strength and tone is noted. There is no obvious tremor.  Fine motor skills are grossly intact in the upper and lower extremities.  Cerebellar testing: No dysmetria or intention tremor on finger to nose testing. Heel to shin is unremarkable bilaterally. There is no truncal or gait ataxia.  Sensory exam: intact to light touch in the upper and lower extremities.  Gait, station and balance: He stands without difficulty, posture shows mild increase in lumbar kyphosis.  He walks without a walking aid.  No shuffling noted, preserved arm swing.   Assessment and Plan:    In summary, JAEDIN REGINA is a 67 year old right-handed gentleman with an underlying complex medical history of coronary artery  disease with status post stenting, paroxysmal A. fib with status post cardioversion in 2018, chronic rhinitis, reflux disease, diabetes, migraine headaches, sleep apnea (on PAP therapy, followed by [facility]), hyperlipidemia, hypertension, allergies, arthritis, low back pain, on chronic narcotic pain medication, and overweight state, who presents for reevaluation of his memory loss of several years duration.  He has multiple vascular risk factors.  He had a brain MRI in early 2023.  He has been on sleep apnea treatment with a CPAP machine, followed by [facility].  He is no longer on narcotic pain medication, by wife's estimate since February 2023.  His memory scores have declined.  He had neuropsychological evaluation in 2022 which supported mild cognitive impairment at the time.  He is advised to stay well-hydrated with water, try to get 6 to 8 cups of water per day, 8 ounce size each.  He had recent blood work through his PCP.  He is advised to be consistent with his CPAP.  They are advised to monitor his driving, he is reminded to stay within a comfortable local bradycardia is and avoid highway driving or nighttime driving at this time.  We also did an EEG in 2023.  His CT head without contrast in June 2022 did not show any acute findings or alarming structural changes. I advised him to start generic Aricept 10 mg strength.  We talked about expectations, limitations and possible common side effects.  He is advised to take half a pill once daily for the first month and then increase it to 1 pill once daily thereafter.  For mood irritability, he is advised to follow-up with his primary care to discuss options for treatment including the possibility of low-dose antidepressant treatment.  He is advised to follow-up in this clinic to see one of our nurse practitioners routinely in about 6 months, sooner if needed.  I answered all the questions today and the patient and his wife were in agreement.  I  spent 45 minutes in total face-to-face time and in reviewing records during pre-charting, more than 50% of which was spent in counseling and coordination of care, reviewing test results, reviewing medications and treatment regimen and/or in discussing or reviewing the diagnosis of dementia with mood disturbance, the prognosis and treatment options. Pertinent laboratory and imaging test results that were available during this visit with the patient were reviewed by me and  considered in my medical decision making (see chart for details).

## 2023-03-09 NOTE — Patient Instructions (Signed)
It was nice to see you today.   Please try to increase your water intake to 6-8 cups/day, 8 oz size each.   Please have your spouse/family monitor your driving; I would suggest that you use only local roads, familiar routes, no nighttime and no highway driving.   For mood irritability, please follow up with Dr. Tracie Harrier to discuss management and potential medication, such as low dose antidepressant.   As discussed, we will start you on a new medication for memory: Aricept (generic name: donepezil) 10 mg: take 1/2 pill each evening for the 1st month, then 1 pill daily thereafter.  Common side effects may include dry eyes, dry mouth, confusion, low pulse, low blood pressure, also GI related side effects (nausea, vomiting, diarrhea, constipation), headaches; rare side effects may include hallucinations and seizures.

## 2023-03-16 ENCOUNTER — Ambulatory Visit: Payer: Medicare Other | Admitting: Cardiology

## 2023-03-26 ENCOUNTER — Ambulatory Visit: Payer: Medicare Other | Admitting: Cardiology

## 2023-04-13 NOTE — Progress Notes (Unsigned)
Cardiology Office Note:  .   Date:  04/16/2023  ID:  Michael Hartman [birth year], MRN [id number] PCP: Gaspar Garbe, MD  [facility] Providers Cardiologist:  Peter Swaziland, MD}   }   History of Present Illness: Marland Kitchen   Michael Hartman is a 67 y.o. male with past medical history of CAD (s/p stenting of RCA in 1998), PAF (on Eliquis), HTN, HLD, Type 2 DM, and OSA (on CPAP).   He comes today with his wife.  He has been diagnosed with cognitive impairment (mild), and has become more forgetful especially concerning taking his medications.  His wife has taken over this which is a bone of contention for them.  He does not believe that he is becoming forgetful.  He denies any discomfort in his chest, dyspnea on exertion, palpitations, or worsening fatigue.  He denies any bleeding on Eliquis.  ROS: As above otherwise negative.  Studies Reviewed: .   Echo 08/10/20:  1. Left ventricular ejection fraction, by estimation, is 60 to 65%. The  left ventricle has normal function. The left ventricle has no regional  wall motion abnormalities. Left ventricular diastolic parameters are  indeterminate.   2. Right ventricular systolic function is normal. The right ventricular  size is mildly enlarged. Tricuspid regurgitation signal is inadequate for  assessing PA pressure.   3. Left atrial size was moderately dilated.   4. Right atrial size was moderately dilated.   5. The mitral valve is normal in structure. Trivial mitral valve  regurgitation. No evidence of mitral stenosis.   6. The aortic valve is tricuspid. Aortic valve regurgitation is not  visualized. No aortic stenosis is present.   7. The inferior vena cava is normal in size with greater than 50%  respiratory variability, suggesting right atrial pressure of 3 mmHg.    Assessment:     1. Paroxysmal atrial fibrillation (HCC)   2. OSA (obstructive sleep apnea)   3. HYPERCHOLESTEROLEMIA   4. Coronary artery disease involving native  coronary artery of native heart without angina pectoris   5. Essential hypertension      EKG Interpretation Date/Time:  Thursday April 16 2023 15:17:32 EST Ventricular Rate:  64 PR Interval:  156 QRS Duration:  86 QT Interval:  384 QTC Calculation: 396 R Axis:   45  Text Interpretation: Normal sinus rhythm with sinus arrhythmia Normal ECG When compared with ECG of 15-Oct-2021 19:24, PREVIOUS ECG IS PRESENT Confirmed by Joni Reining [phone]) on 04/16/2023 3:41:38 PM    Physical Exam:   VS:  BP 110/68 (BP Location: Left Arm, Patient Position: Sitting, Cuff Size: Large)   Pulse 67   Resp 16   Ht 6\' 2"  (1.88 m)   Wt 246 lb 6.4 oz (111.8 kg)   SpO2 97%   BMI 31.64 kg/m    Wt Readings from Last 3 Encounters:  04/16/23 246 lb 6.4 oz (111.8 kg)  03/09/23 253 lb (114.8 kg)  04/15/22 230 lb (104.3 kg)    GEN: Well nourished, well developed in no acute distress NECK: No JVD; No carotid bruits CARDIAC: IRRR, no murmurs, rubs, gallops RESPIRATORY:  Clear to auscultation without rales, wheezing or rhonchi  ABDOMEN: Soft, non-tender, non-distended EXTREMITIES:  No edema; No deformity   ASSESSMENT AND PLAN: .    CAD: History of stenting to the right coronary artery in 1998.  He denies any new symptoms of angina.  He does walk a good bit and denies exertional symptoms.  He will continue  on secondary management with blood pressure control, lipid management, weight management, and purposeful exercise.  2.  PAF: He is in normal sinus rhythm with PACs today.  He will continue on Eliquis 5 mg twice daily.  Calculation has been completed and he is on the correct dose.  The patient will also continue diltiazem and metoprolol as directed.  He is followed by Dr. Elberta Fortis in the A-fib clinic.  3.  Hypercholesterolemia: Goal of LDL less than 70.  He remains on high-dose atorvastatin 80 mg daily.  Labs have recently been drawn by primary care.  4.  Mild cognitive impairment: The patient has  sometimes become forgetful with his medications according to his wife.  She is worried that he will take more than he is supposed to.  She has him on a regimented schedule with a pill dispenser.  He is resistant to her taking over his medication.  I have explained to him about the safety of taking medications as directed and agree that it would be best for her to take care of this for him if he has become forgetful.  He is unconvinced.         Signed, Bettey Mare. Liborio Nixon, ANP, AACC

## 2023-04-16 ENCOUNTER — Encounter: Payer: Self-pay | Admitting: Adult Health

## 2023-04-16 ENCOUNTER — Ambulatory Visit: Payer: Medicare Other | Attending: Adult Health | Admitting: Adult Health

## 2023-04-16 VITALS — BP 110/68 | HR 67 | Resp 16 | Ht 74.0 in | Wt 246.4 lb

## 2023-04-16 DIAGNOSIS — I480 Paroxysmal atrial fibrillation: Secondary | ICD-10-CM

## 2023-04-16 DIAGNOSIS — I2510 Atherosclerotic heart disease of native coronary artery without angina pectoris: Secondary | ICD-10-CM | POA: Diagnosis not present

## 2023-04-16 DIAGNOSIS — G3184 Mild cognitive impairment, so stated: Secondary | ICD-10-CM | POA: Diagnosis not present

## 2023-04-16 DIAGNOSIS — E7800 Pure hypercholesterolemia, unspecified: Secondary | ICD-10-CM | POA: Diagnosis not present

## 2023-04-16 MED ORDER — DILTIAZEM HCL ER COATED BEADS 180 MG PO CP24: 180.0000 mg | ORAL_CAPSULE | Freq: Every day | ORAL | 3 refills | Status: DC

## 2023-04-16 MED ORDER — APIXABAN 5 MG PO TABS: 5.0000 mg | ORAL_TABLET | Freq: Two times a day (BID) | ORAL | 1 refills | Status: DC

## 2023-04-16 NOTE — Patient Instructions (Signed)
Medication Instructions:  No Changes *If you need a refill on your cardiac medications before your next appointment, please call your pharmacy*   Lab Work: No Labs If you have labs (blood work) drawn today and your tests are completely normal, you will receive your results only by: MyChart Message (if you have MyChart) OR A paper copy in the mail If you have any lab test that is abnormal or we need to change your treatment, we will call you to review the results.   Testing/Procedures: No Testing   Follow-Up: At [facility], you and your health needs are our priority.  As part of our continuing mission to provide you with exceptional heart care, we have created designated Provider Care Teams.  These Care Teams include your primary Cardiologist (physician) and Advanced Practice Providers (APPs -  Physician Assistants and Nurse Practitioners) who all work together to provide you with the care you need, when you need it.  We recommend signing up for the patient portal called "MyChart".  Sign up information is provided on this After Visit Summary.  MyChart is used to connect with patients for Virtual Visits (Telemedicine).  Patients are able to view lab/test results, encounter notes, upcoming appointments, etc.  Non-urgent messages can be sent to your provider as well.   To learn more about what you can do with MyChart, go to [email or web address].    Your next appointment:   1 year(s)  Provider:   Peter Jordan, MD   

## 2023-04-21 ENCOUNTER — Ambulatory Visit: Payer: Medicare Other | Admitting: Pulmonary Disease

## 2023-05-11 ENCOUNTER — Other Ambulatory Visit: Payer: Self-pay | Admitting: Cardiology

## 2023-06-22 NOTE — Progress Notes (Signed)
  Electrophysiology Office Note:   Date:  06/23/2023  ID:  Delano, Frate [birth year], MRN [id number]  Primary Cardiologist: Peter Jordan, MD Primary Heart Failure: None Electrophysiologist: Will Gladis Norton, MD      History of Present Illness:   Michael Hartman is a 68 y.o. male, retired from [employer], with h/o atrial flutter, AF s/p ablation in 2022, HTN, HLD, CAD, OSA, Celiac disease, DM II seen today for routine electrophysiology followup.   Since last being seen in our clinic the patient reports he has been doing well. No known AF issues. He has not has symptoms and does not monitor for AF.  He is compliant with medications.    He denies chest pain, palpitations, dyspnea, PND, orthopnea, nausea, vomiting, dizziness, syncope, edema, weight gain, or early satiety.   Review of systems complete and found to be negative unless listed in HPI.   EP Information / Studies Reviewed:    EKG is not ordered today. EKG from 04/16/23 reviewed which showed NSR with sinus arrhythmia, 64 bpm      Studies:  ECHO 08/2020 > LVEF 60-65%, indeterminate LV diastolic parameters, LA mod dilated, RA mod dilated   Arrhythmia / AAD AF s/p Ablation 10/24/20  AFL     Risk Assessment/Calculations:    CHA2DS2-VASc Score = 4   This indicates a 4.8% annual risk of stroke. The patient's score is based upon: CHF History: 0 HTN History: 1 Diabetes History: 1 Stroke History: 0 Vascular Disease History: 1 Age Score: 1 Gender Score: 0             Physical Exam:   VS:  BP 134/76   Pulse 63   Ht 6' 2 (1.88 m)   Wt 258 lb 12.8 oz (117.4 kg)   SpO2 92%   BMI 33.23 kg/m    Wt Readings from Last 3 Encounters:  06/23/23 258 lb 12.8 oz (117.4 kg)  04/16/23 246 lb 6.4 oz (111.8 kg)  03/09/23 253 lb (114.8 kg)     GEN: Well nourished, well developed in no acute distress NECK: No JVD; No carotid bruits CARDIAC: Regular rate and rhythm, no murmurs, rubs, gallops RESPIRATORY:  Clear to  auscultation without rales, wheezing or rhonchi  ABDOMEN: Soft, non-tender, non-distended EXTREMITIES:  No edema; No deformity   ASSESSMENT AND PLAN:    Paroxysmal Atrial Fibrillation  AFL  CHA2DS2-VASc 4. S/p ablation 10/2020.  -no symptom burden per pt -OAC for stoke prophylaxis  -continue cardizem  CD 180mg  every day -continue Toprol  100mg  every day   Secondary Hypercoagulable State  -continue Eliquis  5mg  BID, dose reviewed and appropriate by age/wt -update anticoagulation labs > CBC, BMP   -no issues with bleeding   Hypertension  CAD -well controlled on current regimen    OSA  -CPAP compliance encouraged     Follow up with Dr. Norton in 12 months  Signed, Daphne Barrack, MSN, APRN, NP-C, AGACNP-BC [facility] - Electrophysiology  06/23/2023, 2:37 PM

## 2023-06-23 ENCOUNTER — Encounter: Payer: Self-pay | Admitting: Pulmonary Disease

## 2023-06-23 ENCOUNTER — Ambulatory Visit: Payer: Medicare Other | Attending: Pulmonary Disease | Admitting: Pulmonary Disease

## 2023-06-23 VITALS — BP 134/76 | HR 63 | Ht 74.0 in | Wt 258.8 lb

## 2023-06-23 DIAGNOSIS — I10 Essential (primary) hypertension: Secondary | ICD-10-CM

## 2023-06-23 DIAGNOSIS — I483 Typical atrial flutter: Secondary | ICD-10-CM

## 2023-06-23 DIAGNOSIS — D6869 Other thrombophilia: Secondary | ICD-10-CM | POA: Diagnosis not present

## 2023-06-23 DIAGNOSIS — I480 Paroxysmal atrial fibrillation: Secondary | ICD-10-CM

## 2023-06-23 NOTE — Patient Instructions (Addendum)
 Medication Instructions:  Your physician recommends that you continue on your current medications as directed. Please refer to the Current Medication list given to you today.  *If you need a refill on your cardiac medications before your next appointment, please call your pharmacy*  Lab Work: CBC and Bmet--Today  If you have labs (blood work) drawn today and your tests are completely normal, you will receive your results only by: MyChart Message (if you have MyChart) OR A paper copy in the mail If you have any lab test that is abnormal or we need to change your treatment, we will call you to review the results.  Testing/Procedures: None ordered.  Follow-Up: At [facility], you and your health needs are our priority.  As part of our continuing mission to provide you with exceptional heart care, we have created designated Provider Care Teams.  These Care Teams include your primary Cardiologist (physician) and Advanced Practice Providers (APPs -  Physician Assistants and Nurse Practitioners) who all work together to provide you with the care you need, when you need it.   Your next appointment:   1 year(s)  The format for your next appointment:   In Person  Provider:   Dr Camnitz{or one of the following Advanced Practice Providers on your designated Care Team:   Charlies Arthur, [place] Ozell Jodie Passey, [place] Leotis Barrack, NP   Important Information About Sugar

## 2023-06-24 LAB — CBC
Hematocrit: 45 % (ref 37.5–51.0)
Hemoglobin: 14.9 g/dL (ref 13.0–17.7)
MCH: 30 pg (ref 26.6–33.0)
MCHC: 33.1 g/dL (ref 31.5–35.7)
MCV: 91 fL (ref 79–97)
Platelets: 270 10*3/uL (ref 150–450)
RBC: 4.96 x10E6/uL (ref 4.14–5.80)
RDW: 13 % (ref 11.6–15.4)
WBC: 4.9 10*3/uL (ref 3.4–10.8)

## 2023-06-24 LAB — BASIC METABOLIC PANEL
BUN/Creatinine Ratio: 16 (ref 10–24)
BUN: 20 mg/dL (ref 8–27)
CO2: 22 mmol/L (ref 20–29)
Calcium: 10 mg/dL (ref 8.6–10.2)
Chloride: 99 mmol/L (ref 96–106)
Creatinine, Ser: 1.23 mg/dL (ref 0.76–1.27)
Glucose: 248 mg/dL — ABNORMAL HIGH (ref 70–99)
Potassium: 4.6 mmol/L (ref 3.5–5.2)
Sodium: 140 mmol/L (ref 134–144)
eGFR: 64 mL/min/{1.73_m2} (ref >59)

## 2023-07-27 ENCOUNTER — Other Ambulatory Visit: Payer: Self-pay | Admitting: Cardiology

## 2023-09-17 ENCOUNTER — Encounter: Payer: Self-pay | Admitting: Family Medicine

## 2023-09-17 NOTE — Patient Instructions (Addendum)
 Below is our plan:  We will continue donepezil 10mg  daily. Please use your CPAP daily and follow up with your care team closely.   We can consider adding mematine if you change your mind. I have included some information in your visit summary today.   Please make sure you are staying well hydrated. I recommend 50-60 ounces daily. Well balanced diet and regular exercise encouraged. Consistent sleep schedule with 6-8 hours recommended.   Please continue follow up with care team as directed.   Follow up with me in 6 months if you decide to continue medications. If your PCP will write your donepezil, you do not have to follow up with me.   You may receive a survey regarding today's visit. I encourage you to leave honest feed back as I do use this information to improve patient care. Thank you for seeing me today!   Management of Memory Problems   There are some general things you can do to help manage your memory problems.  Your memory may not in fact recover, but by using techniques and strategies you will be able to manage your memory difficulties better.   1)  Establish a routine. Try to establish and then stick to a regular routine.  By doing this, you will get used to what to expect and you will reduce the need to rely on your memory.  Also, try to do things at the same time of day, such as taking your medication or checking your calendar first thing in the morning. Think about think that you can do as a part of a regular routine and make a list.  Then enter them into a daily planner to remind you.  This will help you establish a routine.   2)  Organize your environment. Organize your environment so that it is uncluttered.  Decrease visual stimulation.  Place everyday items such as keys or cell phone in the same place every day (ie.  Basket next to front door) Use post it notes with a brief message to yourself (ie. Turn off light, lock the door) Use labels to indicate where things go (ie.  Which cupboards are for food, dishes, etc.) Keep a notepad and pen by the telephone to take messages   3)  Memory Aids A diary or journal/notebook/daily planner Making a list (shopping list, chore list, to do list that needs to be done) Using an alarm as a reminder (kitchen timer or cell phone alarm) Using cell phone to store information (Notes, Calendar, Reminders) Calendar/White board placed in a prominent position Post-it notes   In order for memory aids to be useful, you need to have good habits.  It's no good remembering to make a note in your journal if you don't remember to look in it.  Try setting aside a certain time of day to look in journal.   4)  Improving mood and managing fatigue. There may be other factors that contribute to memory difficulties.  Factors, such as anxiety, depression and tiredness can affect memory. Regular gentle exercise can help improve your mood and give you more energy. Exercise: there are short videos created by the [employer] on Health specially for older adults: https://bit.ly/2I30q97.  Mediterranean diet: which emphasizes fruits, vegetables, whole grains, legumes, fish, and other seafood; unsaturated fats such as olive oils; and low amounts of red meat, eggs, and sweets. A variation of this, called MIND [facility] Intervention for Neurodegenerative Delay) incorporates the DASH (Dietary Approaches to Stop Hypertension) diet,  which has been shown to lower high blood pressure, a risk factor for Alzheimer's disease. More information at: ExitMarketing.de.  Aerobic exercise that improve heart health is also good for the mind.  [employer] on Aging have short videos for exercises that you can do at home: BlindWorkshop.com.pt Simple relaxation techniques may help relieve symptoms of anxiety Try to get back to completing activities or hobbies you enjoyed doing in  the past. Learn to pace yourself through activities to decrease fatigue. Find out about some local support groups where you can share experiences with others. Try and achieve 7-8 hours of sleep at night.   Tasks to improve attention/working memory 1. Good sleep hygiene (7-8 hrs of sleep) 2. Learning a new skill (Painting, Carpentry, Pottery, new language, Knitting). 3.Cognitive exercises (keep a daily journal, Puzzles) 4. Physical exercise and training  (30 min/day X 4 days week) 5. Being on Antidepressant if needed 6.Yoga, Meditation, Tai Chi 7. Decrease alcohol intake 8.Have a clear schedule and structure in daily routine   MIND Diet: The Mediterranean-DASH Diet Intervention for Neurodegenerative Delay, or MIND diet, targets the health of the aging brain. Research participants with the highest MIND diet scores had a significantly slower rate of cognitive decline compared with those with the lowest scores. The effects of the MIND diet on cognition showed greater effects than either the Mediterranean or the DASH diet alone.   The healthy items the MIND diet guidelines suggest include:   3+ servings a day of whole grains 1+ servings a day of vegetables (other than green leafy) 6+ servings a week of green leafy vegetables 5+ servings a week of nuts 4+ meals a week of beans 2+ servings a week of berries 2+ meals a week of poultry 1+ meals a week of fish Mainly olive oil if added fat is used   The unhealthy items, which are higher in saturated and trans fat, include: Less than 5 servings a week of pastries and sweets Less than 4 servings a week of red meat (including beef, pork, lamb, and products made from these meats) Less than one serving a week of cheese and fried foods Less than 1 tablespoon a day of butter/stick margarine

## 2023-09-17 NOTE — Progress Notes (Signed)
 Chief Complaint  Patient presents with   Follow-up    Pt in 2 with wife Pt here for memory f/u     HISTORY OF PRESENT ILLNESS:  09/21/23 ALL:  Michael Hartman returns for follow up. He last saw Dr Omar Bibber and had noted worsening memory. He was started on donepezil 10mg  daily.   Since, he reports doing well. He feels that his memory is fine and does not feel there is a problem. Mrs Shirk reports that he has days where he seems at his baseline and others where is seems really agitated and confused. She tells me of an event, recently, where Mr Mullens was open to her driving to [place], alone. She made the drive several days I a row but one morning, he got really agitated and told her she could not drive as she was not on their insurance and could go back to where she came from. Mr Persichetti does not remember this and tells me that it did not happen the way she described. He continues to drive. Mrs Umscheid denies any concerns with his driving. No accidents or episodes of getting lost. He is aggravated with having to see us , today, and tells me that he is not coming back.   He continues CPAP therapy. He reports using it every night. Mrs Ringer tells me that he usually gets up at night and does not restart therapy.   03/09/2023 SA:  He reports no significant difficulty with his memory.  He does not feel that there has been much difference in the past year, his wife reports that he has been more forgetful.  He may forget a conversation, still does his own ADLs and also drives but usually only to familiar places.  He no longer sees Dr. [name].  He reports that he "fired him" and did not want to see anybody else in his office either. Wife reports mood irritability.  He reports full compliance with his CPAP machine.   He reports drinking liquids, likes to drink diet soda, 2 bottles or 2 cans/day.  He drinks some Soda. He does not drink any alcohol, he quit smoking many years ago.  Mom had memory loss, she lived to  be early 60s.     He saw his primary care physician on 09/23/2022 and I reviewed the note.  He had blood work at the time including vitamin B12, folate, CBC with differential, CMP, lipid panel, A1c, and TSH.  I reviewed blood test results in her paper chart, lipid panel showed elevated cholesterol of 295, triglycerides elevated at 280, LDL elevated at 195.  CMP showed elevated glucose at 205, BUN 14, creatinine 0.94.  TSH normal at 1.36. Of note, he had a brain MRI with and without contrast on 10/14/2021 and I reviewed the results:  IMPRESSION: 1. No acute intracranial abnormality. 2. Minimal chronic small vessel ischemic disease and mild cerebral atrophy.   They were notified by phone call.     He had an EEG through our office on 09/24/2021 and I reviewed the results: Impression: This is an abnormal EEG recording in the waking and drowsy state due to mild diffuse slowing. Diffuse slowing is consistent with a generalized brain dysfunction, nonspecific.      They were notified by phone call.  01/15/2022 ALL:  Aydn returns for follow up for memory loss. He was last seen by Dr Omar Bibber 09/2021. MRI was unremarkable. EEG showed generalized slowing but no seizure activity. He was involuntarily committed to [facility] 5/17-5/25/2023 due to worsening delusions and hallucinations. He was advised to continue outpatient follow up with [facility] but has refused. Per his wife, he has not returned to pain management. Per PDMP, Oxycodone was last filled for 90 tabs 09/26/2021.    He presents, today, with his wife. They both tell me that he seems to be doing much better. Since stopping opiates, hallucinations have stopped. He can be irritable and tends to repeat himself at times but erratic behaviors are better. No obvious delusional thoughts but his wife does mention comments that he makes that can be hurtful. He has been able to drive without difficulty. He can grab groceries if needed. He is  performing all ADLs independently. MMSE 29/30, today. He tells me that back pain has worsened but he does not wish to return to pain management. He is not interested in surgery. He denies concerns of anxiety or depression. He does not think he needs to pursue psychology/psychiatry referral. He is sleeping well.   He continues CPAP. Notes from [facility] reviewed in Epic. Download in 07/2021 shows optimal compliance and apnea well managed.   09/12/2021 SA: He reports that he does not want to be here today and that he is only here because his wife made him come.  He has no new concerns, he denies any discrepancies with his narcotic pain medication and he does not wish to pursue an MRI but he voided if his wife insists. He would be willing to do any imaging of his wife wants it to be done. He repeatedly states "I don't care".  Indicates that he does not wish for his wife to go up with him when he has a pain clinic follow-up.  She reports that she is not fully sure how he takes his medication as he does it on his own, sometimes he takes medication out of the medication bottle and may put it in a different bottle.  She had previously indicated that he may request refills on his pain medication early before they are due .  He had A fib ablation in May 2022. He is followed by cardiology on a regular basis.  For the past week or so he has had delusions including sister, she reports that he does not even have a sister.  She reports that primary care did not offer any additional recommendation for mood disorder.  Patient declines a referral to psychiatry today.  She wonders if he can have his brain MRI now.  He appears to be indifferent to it but would be willing to pursue it with light sedation due to his claustrophobia.  She indicates that even if we were to start medication, she is not sure that he would come back for follow-up here.  I explained to her that memory medication can exacerbate hallucinations, if he has  evidence of hallucinations, it is certainly a higher risk.  In addition, he takes several MEDICATIONS including Robaxin, Remeron, long-acting morphine sulfate, oxycodone, and Zanaflex.   04/24/2021 ALL: SRIANSH FARRA is a 68 y.o. male here today for follow up for MCI. He was last seen by Dr Mariel Shope 10/2020. Neurocognitive eval was suggestive of MCI but variable effort and results noted. Dementia medications were not advised and he was encouraged to follow up with PCP for concerns of anxiety and depression. CT head showed some calcifications of vertebral and internal carotid arteries with minimal atrophy. He presents with his wife who aids in history. He continues to note  short term memory loss. 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 He reports being independent with ADLs. Mrs Gatchalian does not dispute. He reports managing his medicaitons without difficulty. Mrs Slager expresses some concern with this statement. She reports that he will go to the pharmacy to fill medications earlier than he should. He tells me that he has ran out of his oxycodone 10-325mg  tablets. PDMP shows he picked up 90 tablets 10/31. After discussion he is not sure if this is the medication he is out of. He is also taking morphine 30mg  twice daily. Fill date was also 10/31. He continues to drive without difficulty. No obvious concerns per his wife. He is more irritable. He feels that his wife is always questioning him and feels that "she always has to be right". He shugs when asked if he feels anxious or depressed. He seems to sleep well. Appetite is good. No gait changes or falls.    HISTORY (copied from Dr Teofilo Pod previous note) Mr. Odor is a 68 year old right-handed gentleman with an underlying complex medical history of coronary artery disease with status post stenting, paroxysmal A. fib with status post cardioversion in 2018, chronic rhinitis, reflux disease, diabetes, migraine headaches, sleep apnea, on  CPAP therapy, hyperlipidemia, hypertension, allergies, arthritis, low back pain, on chronic narcotic pain medication, and overweight state, who Presents for follow-up consultation of his memory loss, after interim neuropsychological evaluation.  The patient is accompanied by his wife today.  I first met him at the request of his primary care physician on 07/16/2020, at which time his wife reported concern about his short-term memory for the past 2 to 3 years.  He was repeating himself, forgetting conversations and asking the same question over again.  He had blood work through his primary care physician.  His MMSE during her first office visit was 26 out of 30.  He was advised to proceed with a brain MRI and he was advised to proceed with neuropsychological evaluation.  He was found to have an irregular heartbeat.  I suggested sooner than scheduled follow-up with his cardiologist.   REVIEW OF SYSTEMS: Out of a complete 14 system review of symptoms, the patient complains only of the following symptoms, short term memory loss, irritability,  and all other reviewed systems are negative.   ALLERGIES: Allergies  Allergen Reactions   Ambien [Zolpidem Tartrate]     Amnesia, odd behavior   Amoxicillin Rash   Penicillins Rash    Child hood     HOME MEDICATIONS: Outpatient Medications Prior to Visit  Medication Sig Dispense Refill   acetaminophen (TYLENOL) 500 MG tablet Take 1,000 mg by mouth every 6 (six) hours as needed for mild pain or headache.     apixaban (ELIQUIS) 5 MG TABS tablet Take 1 tablet (5 mg total) by mouth 2 (two) times daily. 180 tablet 1   atorvastatin (LIPITOR) 80 MG tablet Take 1 tablet (80 mg total) by mouth daily. 90 tablet 3   diltiazem (CARDIZEM CD) 180 MG 24 hr capsule Take 1 capsule (180 mg total) by mouth daily. 90 capsule 3   docusate sodium (COLACE) 100 MG capsule Take 100 mg by mouth in the morning.     escitalopram (LEXAPRO) 10 MG tablet Take 10 mg by mouth daily.      FARXIGA 10 MG TABS tablet Take 10 mg by mouth in the morning.     fluticasone (FLONASE) 50 MCG/ACT nasal spray Place 1 spray into both nostrils at  bedtime.     glipiZIDE (GLUCOTROL XL) 10 MG 24 hr tablet Take 10 mg by mouth daily.     JARDIANCE 25 MG TABS tablet Take 25 mg by mouth daily.     metFORMIN (GLUCOPHAGE) 1000 MG tablet Take 1,000 mg by mouth 2 (two) times daily.     methocarbamol (ROBAXIN) 500 MG tablet Take 500 mg by mouth 2 (two) times daily as needed for muscle spasms.     metoprolol succinate (TOPROL-XL) 100 MG 24 hr tablet TAKE 1 TABLET BY MOUTH DAILY 90 tablet 3   mirtazapine (REMERON) 45 MG tablet Take 45 mg by mouth at bedtime.     Multiple Vitamin (MULTIVITAMIN WITH MINERALS) TABS tablet Take 1 tablet by mouth in the morning.     nitroGLYCERIN (NITROSTAT) 0.4 MG SL tablet Place 1 tablet (0.4 mg total) under the tongue every 5 (five) minutes as needed for chest pain. 25 tablet 11   Olmesartan-amLODIPine-HCTZ (TRIBENZOR) 40-5-12.5 MG TABS Take 1 tablet by mouth in the morning.     ONE TOUCH ULTRA TEST test strip 1 each by Other route as needed. Use 1 strip to check glucose daily 100 each 4   pioglitazone (ACTOS) 30 MG tablet Take 30 mg by mouth daily.     TRADJENTA 5 MG TABS tablet Take 5 mg by mouth in the morning.     donepezil (ARICEPT) 10 MG tablet Take 1 tablet (10 mg total) by mouth at bedtime. Follow instructions provided for the first month of treatment. 90 tablet 3   No facility-administered medications prior to visit.     PAST MEDICAL HISTORY: Past Medical History:  Diagnosis Date   Allergies    Arthritis    Atrial fibrillation    Atrial flutter    a. diagnosed in 11/2016 --> started on Eliquis for anticoagulation b. s/p DCCV in 01/2017   CAD (coronary artery disease) 08/02/2007   Celiac disease    Chronic insomnia    Chronic rhinitis 08/02/2007   Coronary artery disease    a. s/p remote stenting of RCA in 1998   Diabetes mellitus type 2 03/23/2012    Erectile dysfunction    Essential hypertension 08/02/2007   Gastro-esophageal reflux disease with esophagitis 09/19/2020   History of migraine headaches    MCI (mild cognitive impairment) 09/19/2020   OSA (obstructive sleep apnea)    uses CPAP regularly   Pure hypercholesterolemia 08/02/2007   Secondary hypercoagulable state 07/26/2020   Sinusitis    Stage 2 chronic kidney disease      PAST SURGICAL HISTORY: Past Surgical History:  Procedure Laterality Date   ANKLE ARTHROPLASTY     ATRIAL FIBRILLATION ABLATION N/A 10/24/2020   Procedure: ATRIAL FIBRILLATION ABLATION;  Surgeon: Lei Pump, MD;  Location: [facility] INVASIVE CV LAB;  Service: Cardiovascular;  Laterality: N/A;   CARDIAC CATHETERIZATION  06/06/1997   EF 65%   CARDIOVASCULAR STRESS TEST  12/07/2007   EF 58%   CARDIOVERSION N/A 01/08/2017   Procedure: CARDIOVERSION;  Surgeon: Loyde Rule, MD;  Location: [facility] ENDOSCOPY;  Service: Cardiovascular;  Laterality: N/A;   CARDIOVERSION N/A 08/01/2020   Procedure: CARDIOVERSION;  Surgeon: Euell Herrlich, MD;  Location: [facility] ENDOSCOPY;  Service: Cardiovascular;  Laterality: N/A;   CORONARY STENT PLACEMENT     ELBOW ARTHROPLASTY     EYE SURGERY     KNEE ARTHROPLASTY     low back surgery     NASAL SINUS SURGERY     REFRACTIVE SURGERY  SHOULDER ACROMIOPLASTY     SPINE SURGERY       FAMILY HISTORY: Family History  Problem Relation Age of Onset   Hypertension Mother    Diabetes Mother    Alzheimer's disease Mother    Gallstones Mother    Ulcers Mother    Arthritis Mother    Stroke Father    Hypertension Father    Heart attack Father    Hypertension Brother      SOCIAL HISTORY: Social History   Socioeconomic History   Marital status: Married    Spouse name: Aden Agreste   Number of children: 0   Years of education: 16   Highest education level: Bachelor's degree (e.g., BA, AB, BS)  Occupational History   Occupation: Retired    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  Generalized: Well developed, in no acute distress  Cardiology: normal rate and rhythm, no murmur auscultated  Respiratory: clear to auscultation bilaterally    Neurological examination  Mentation: Alert oriented to time, place, history taking. Follows all commands speech and language fluent Cranial nerve II-XII: Pupils were equal round reactive to light. Extraocular movements were full, visual field were full on confrontational test. Facial sensation and strength were normal. Head turning and shoulder shrug  were normal and symmetric. Motor: The motor testing reveals 5 over 5 strength of all 4 extremities. Good symmetric motor tone is noted throughout.  Gait and station: Gait is normal.    DIAGNOSTIC DATA (LABS, IMAGING, TESTING) - I reviewed patient records, labs, notes, testing and imaging myself where available.  Lab Results  Component Value Date   WBC 4.9 06/23/2023   HGB 14.9 06/23/2023    HCT 45.0 06/23/2023   MCV 91 06/23/2023   PLT 270 06/23/2023      Component Value Date/Time   NA 140 06/23/2023 1459   K 4.6 06/23/2023 1459   CL 99 06/23/2023 1459   CO2 22 06/23/2023 1459   GLUCOSE 248 (H) 06/23/2023 1459   GLUCOSE 130 (H) 10/15/2021 1949   BUN 20 06/23/2023 1459   CREATININE 1.23 06/23/2023 1459   CALCIUM 10.0 06/23/2023 1459   PROT 6.4 (L) 10/15/2021 1949   PROT 7.1 11/18/2016 1033   ALBUMIN 3.8 10/15/2021 1949   ALBUMIN 4.6 11/18/2016 1033  AST 26 10/15/2021 1949   ALT 21 10/15/2021 1949   ALKPHOS 52 10/15/2021 1949   BILITOT 1.3 (H) 10/15/2021 1949   BILITOT 0.3 11/18/2016 1033   GFRNONAA >60 10/15/2021 1949   GFRAA 98 01/05/2017 1543   Lab Results  Component Value Date   CHOL 136 11/18/2016   HDL 39 (L) 11/18/2016   LDLCALC 54 11/18/2016   TRIG 217 (H) 11/18/2016   CHOLHDL 3.5 11/18/2016   No results found for: "HGBA1C" No results found for: "VITAMINB12" Lab Results  Component Value Date   TSH 1.479 10/15/2021       09/21/2023    2:30 PM 03/09/2023    1:20 PM 01/15/2022    1:57 PM  MMSE - Mini Mental State Exam  Orientation to time 4 0 4  Orientation to Place 4 5 5   Registration 3 3 3   Attention/ Calculation 4 2 5   Recall 0 0 3  Language- name 2 objects 2 2 2   Language- repeat 1 1 1   Language- follow 3 step command 3 3 3   Language- read & follow direction 1 1 1   Write a sentence 1 0 1  Copy design 0 1 1  Total score 23 18 29          No data to display           ASSESSMENT AND PLAN  68 y.o. year old male  has a past medical history of Allergies, Arthritis, Atrial fibrillation, Atrial flutter, CAD (coronary artery disease) (08/02/2007), Celiac disease, Chronic insomnia, Chronic rhinitis (08/02/2007), Coronary artery disease, Diabetes mellitus type 2 (03/23/2012), Erectile dysfunction, Essential hypertension (08/02/2007), Gastro-esophageal reflux disease with esophagitis (09/19/2020), History of migraine headaches, MCI  (mild cognitive impairment) (09/19/2020), OSA (obstructive sleep apnea), Pure hypercholesterolemia (08/02/2007), Secondary hypercoagulable state (07/26/2020), Sinusitis, and Stage 2 chronic kidney disease. here with    Dementia associated with other underlying disease, with mood disturbance, unspecified dementia severity (HCC) - Plan: donepezil (ARICEPT) 10 MG tablet  OSA on CPAP  Ianmichael tells me, today, that he is doing well and does not have any concerns of memory loss. It seems that hallucinations and delusional behaviors have improved, initially, after stopping oxycodone and morphine. Per Mrs Mccuiston report, he has had more questionable events of delusion behavior. He is easily agitated, today. Previous neurocognitive testing concerning for MCI with possible dementia but results variable do to low effort. I have offered lab evaluation with [facility]N profile but he declines. He is not interested in taking any new medicaitons at this time but does agree to continue donepezil 10mg  at bedtime. I am concerned that anxiety and depression could be contributing. He is taking mirtazapine at bedtime. I have discussed option of psychology referral but he is not interested at this time. He does not appear to be having significant difficulty with driving. We will continue to monitor closely. He was encouraged to continue CPAP therapy as directed. Healthy lifestyle habits encouraged. Memory compensation strategies advised. He may continue follow up with PCP for refills of donepezil if he chooses not to return to [facility]. He will follow up with us  as needed.     I spent 30 minutes of face-to-face and non-face-to-face time with patient.  This included previsit chart review, lab review, study review, order entry, electronic health record documentation, patient education.    No orders of the defined types were placed in this encounter.     Meds ordered this encounter  Medications   donepezil (ARICEPT) 10 MG tablet  Sig: Take 1 tablet (10 mg total) by mouth at bedtime. Follow instructions provided for the first month of treatment.    Dispense:  90 tablet    Refill:  3    Supervising Provider:   Glory Larsen [[id number]]     Terrilyn Fick, MSN, FNP-C 09/21/2023, 3:42 PM  [facility] [address], [address] [place], [place] [address] [phone]

## 2023-09-21 ENCOUNTER — Ambulatory Visit (INDEPENDENT_AMBULATORY_CARE_PROVIDER_SITE_OTHER): Payer: Medicare Other | Admitting: Family Medicine

## 2023-09-21 ENCOUNTER — Encounter: Payer: Self-pay | Admitting: Family Medicine

## 2023-09-21 VITALS — BP 162/80 | HR 64 | Ht 74.0 in | Wt 266.0 lb

## 2023-09-21 DIAGNOSIS — F0283 Dementia in other diseases classified elsewhere, unspecified severity, with mood disturbance: Secondary | ICD-10-CM | POA: Diagnosis not present

## 2023-09-21 DIAGNOSIS — G4733 Obstructive sleep apnea (adult) (pediatric): Secondary | ICD-10-CM | POA: Diagnosis not present

## 2023-09-21 MED ORDER — DONEPEZIL HCL 10 MG PO TABS: 10.0000 mg | ORAL_TABLET | Freq: Every day | ORAL | 3 refills | Status: DC

## 2023-11-24 ENCOUNTER — Other Ambulatory Visit: Payer: Self-pay | Admitting: Adult Health

## 2023-11-24 DIAGNOSIS — I480 Paroxysmal atrial fibrillation: Secondary | ICD-10-CM

## 2023-11-24 NOTE — Telephone Encounter (Signed)
 Prescription refill request for Eliquis  received. Indication:afib Last office visit:1/25 Scr:1.23  1/25 Age: 68 Weight:120.7  kg  Prescription refilled

## 2024-01-05 ENCOUNTER — Encounter: Payer: Self-pay | Admitting: Family Medicine

## 2024-02-07 ENCOUNTER — Inpatient Hospital Stay (HOSPITAL_COMMUNITY)
Admission: EM | Admit: 2024-02-07 | Discharge: 2024-02-09 | DRG: 617 | Disposition: A | Discharge status: Home / Self Care | Attending: Internal Medicine | Admitting: Internal Medicine

## 2024-02-07 ENCOUNTER — Other Ambulatory Visit: Payer: Self-pay

## 2024-02-07 ENCOUNTER — Emergency Department (HOSPITAL_COMMUNITY)

## 2024-02-07 ENCOUNTER — Encounter (HOSPITAL_COMMUNITY): Payer: Self-pay

## 2024-02-07 DIAGNOSIS — Z7901 Long term (current) use of anticoagulants: Secondary | ICD-10-CM

## 2024-02-07 DIAGNOSIS — Z23 Encounter for immunization: Secondary | ICD-10-CM

## 2024-02-07 DIAGNOSIS — E1169 Type 2 diabetes mellitus with other specified complication: Secondary | ICD-10-CM | POA: Diagnosis not present

## 2024-02-07 DIAGNOSIS — G3184 Mild cognitive impairment, so stated: Secondary | ICD-10-CM | POA: Diagnosis present

## 2024-02-07 DIAGNOSIS — L03116 Cellulitis of left lower limb: Secondary | ICD-10-CM | POA: Diagnosis present

## 2024-02-07 DIAGNOSIS — Z87891 Personal history of nicotine dependence: Secondary | ICD-10-CM

## 2024-02-07 DIAGNOSIS — I4892 Unspecified atrial flutter: Secondary | ICD-10-CM | POA: Diagnosis present

## 2024-02-07 DIAGNOSIS — R739 Hyperglycemia, unspecified: Secondary | ICD-10-CM

## 2024-02-07 DIAGNOSIS — I10 Essential (primary) hypertension: Secondary | ICD-10-CM | POA: Diagnosis present

## 2024-02-07 DIAGNOSIS — E66811 Obesity, class 1: Secondary | ICD-10-CM | POA: Diagnosis present

## 2024-02-07 DIAGNOSIS — M869 Osteomyelitis, unspecified: Secondary | ICD-10-CM | POA: Diagnosis present

## 2024-02-07 DIAGNOSIS — E669 Obesity, unspecified: Secondary | ICD-10-CM

## 2024-02-07 DIAGNOSIS — Z79899 Other long term (current) drug therapy: Secondary | ICD-10-CM

## 2024-02-07 DIAGNOSIS — F0390 Unspecified dementia without behavioral disturbance: Secondary | ICD-10-CM | POA: Diagnosis present

## 2024-02-07 DIAGNOSIS — Z955 Presence of coronary angioplasty implant and graft: Secondary | ICD-10-CM

## 2024-02-07 DIAGNOSIS — K2100 Gastro-esophageal reflux disease with esophagitis, without bleeding: Secondary | ICD-10-CM | POA: Diagnosis present

## 2024-02-07 DIAGNOSIS — E11621 Type 2 diabetes mellitus with foot ulcer: Secondary | ICD-10-CM | POA: Diagnosis present

## 2024-02-07 DIAGNOSIS — E1122 Type 2 diabetes mellitus with diabetic chronic kidney disease: Secondary | ICD-10-CM | POA: Diagnosis present

## 2024-02-07 DIAGNOSIS — Z7984 Long term (current) use of oral hypoglycemic drugs: Secondary | ICD-10-CM

## 2024-02-07 DIAGNOSIS — Z8261 Family history of arthritis: Secondary | ICD-10-CM

## 2024-02-07 DIAGNOSIS — L97522 Non-pressure chronic ulcer of other part of left foot with fat layer exposed: Secondary | ICD-10-CM

## 2024-02-07 DIAGNOSIS — I129 Hypertensive chronic kidney disease with stage 1 through stage 4 chronic kidney disease, or unspecified chronic kidney disease: Secondary | ICD-10-CM | POA: Diagnosis present

## 2024-02-07 DIAGNOSIS — E11628 Type 2 diabetes mellitus with other skin complications: Secondary | ICD-10-CM | POA: Diagnosis not present

## 2024-02-07 DIAGNOSIS — L089 Local infection of the skin and subcutaneous tissue, unspecified: Principal | ICD-10-CM | POA: Diagnosis present

## 2024-02-07 DIAGNOSIS — I4819 Other persistent atrial fibrillation: Secondary | ICD-10-CM | POA: Diagnosis present

## 2024-02-07 DIAGNOSIS — E7800 Pure hypercholesterolemia, unspecified: Secondary | ICD-10-CM | POA: Diagnosis present

## 2024-02-07 DIAGNOSIS — E1165 Type 2 diabetes mellitus with hyperglycemia: Secondary | ICD-10-CM | POA: Diagnosis present

## 2024-02-07 DIAGNOSIS — Z823 Family history of stroke: Secondary | ICD-10-CM

## 2024-02-07 DIAGNOSIS — Z880 Allergy status to penicillin: Secondary | ICD-10-CM

## 2024-02-07 DIAGNOSIS — I2510 Atherosclerotic heart disease of native coronary artery without angina pectoris: Secondary | ICD-10-CM | POA: Diagnosis not present

## 2024-02-07 DIAGNOSIS — Z8249 Family history of ischemic heart disease and other diseases of the circulatory system: Secondary | ICD-10-CM

## 2024-02-07 DIAGNOSIS — Z833 Family history of diabetes mellitus: Secondary | ICD-10-CM

## 2024-02-07 DIAGNOSIS — F29 Unspecified psychosis not due to a substance or known physiological condition: Secondary | ICD-10-CM | POA: Diagnosis present

## 2024-02-07 DIAGNOSIS — K900 Celiac disease: Secondary | ICD-10-CM | POA: Diagnosis present

## 2024-02-07 DIAGNOSIS — Z6830 Body mass index (BMI) 30.0-30.9, adult: Secondary | ICD-10-CM

## 2024-02-07 DIAGNOSIS — N182 Chronic kidney disease, stage 2 (mild): Secondary | ICD-10-CM | POA: Diagnosis present

## 2024-02-07 DIAGNOSIS — G4733 Obstructive sleep apnea (adult) (pediatric): Secondary | ICD-10-CM | POA: Diagnosis present

## 2024-02-07 DIAGNOSIS — L97519 Non-pressure chronic ulcer of other part of right foot with unspecified severity: Secondary | ICD-10-CM | POA: Diagnosis present

## 2024-02-07 DIAGNOSIS — Z820 Family history of epilepsy and other diseases of the nervous system: Secondary | ICD-10-CM

## 2024-02-07 DIAGNOSIS — L97529 Non-pressure chronic ulcer of other part of left foot with unspecified severity: Secondary | ICD-10-CM | POA: Diagnosis present

## 2024-02-07 LAB — CBC
HCT: 43.1 % (ref 39.0–52.0)
Hemoglobin: 14.3 g/dL (ref 13.0–17.0)
MCH: 28.9 pg (ref 26.0–34.0)
MCHC: 33.2 g/dL (ref 30.0–36.0)
MCV: 87.1 fL (ref 80.0–100.0)
Platelets: 308 K/uL (ref 150–400)
RBC: 4.95 MIL/uL (ref 4.22–5.81)
RDW: 12.8 % (ref 11.5–15.5)
WBC: 4 K/uL (ref 4.0–10.5)
nRBC: 0 % (ref 0.0–0.2)

## 2024-02-07 LAB — COMPREHENSIVE METABOLIC PANEL WITH GFR
ALT: 16 U/L (ref 0–44)
AST: 22 U/L (ref 15–41)
Albumin: 3.5 g/dL (ref 3.5–5.0)
Alkaline Phosphatase: 89 U/L (ref 38–126)
Anion gap: 17 — ABNORMAL HIGH (ref 5–15)
BUN: 12 mg/dL (ref 8–23)
CO2: 22 mmol/L (ref 22–32)
Calcium: 9.5 mg/dL (ref 8.9–10.3)
Chloride: 95 mmol/L — ABNORMAL LOW (ref 98–111)
Creatinine, Ser: 1.23 mg/dL (ref 0.61–1.24)
GFR, Estimated: >60 mL/min (ref >60)
Glucose, Bld: 233 mg/dL — ABNORMAL HIGH (ref 70–99)
Potassium: 3.9 mmol/L (ref 3.5–5.1)
Sodium: 134 mmol/L — ABNORMAL LOW (ref 135–145)
Total Bilirubin: 1.5 mg/dL — ABNORMAL HIGH (ref 0.0–1.2)
Total Protein: 7 g/dL (ref 6.5–8.1)

## 2024-02-07 LAB — GLUCOSE, CAPILLARY
Glucose-Capillary: 225 mg/dL — ABNORMAL HIGH (ref 70–99)
Glucose-Capillary: 206 mg/dL — ABNORMAL HIGH (ref 70–99)

## 2024-02-07 LAB — I-STAT CG4 LACTIC ACID, ED: Lactic Acid, Venous: 1.6 mmol/L (ref 0.5–1.9)

## 2024-02-07 MED ORDER — VANCOMYCIN HCL 2000 MG/400ML IV SOLN
2000.0000 mg | Freq: Once | INTRAVENOUS | Status: DC
  Filled 2024-02-07 (×2): qty 400

## 2024-02-07 MED ORDER — DILTIAZEM HCL ER COATED BEADS 180 MG PO CP24
180.0000 mg | ORAL_CAPSULE | Freq: Every day | ORAL | Status: DC
  Administered 2024-02-08 – 2024-02-09 (×2): 180 mg via ORAL
  Filled 2024-02-07 (×2): qty 1

## 2024-02-07 MED ORDER — SODIUM CHLORIDE 0.9 % IV SOLN
2.0000 g | Freq: Once | INTRAVENOUS | Status: AC
  Administered 2024-02-07: 2 g via INTRAVENOUS
  Filled 2024-02-07: qty 20

## 2024-02-07 MED ORDER — DONEPEZIL HCL 10 MG PO TABS
10.0000 mg | ORAL_TABLET | Freq: Every day | ORAL | Status: DC
  Administered 2024-02-07 – 2024-02-08 (×2): 10 mg via ORAL
  Filled 2024-02-07 (×2): qty 1

## 2024-02-07 MED ORDER — ATORVASTATIN CALCIUM 80 MG PO TABS
80.0000 mg | ORAL_TABLET | Freq: Every day | ORAL | Status: DC
  Administered 2024-02-08 – 2024-02-09 (×2): 80 mg via ORAL
  Filled 2024-02-07 (×2): qty 1

## 2024-02-07 MED ORDER — VANCOMYCIN HCL IN DEXTROSE 1-5 GM/200ML-% IV SOLN: 1000.0000 mg | Freq: Once | INTRAVENOUS | Status: DC

## 2024-02-07 MED ORDER — HYDROCODONE-ACETAMINOPHEN 5-325 MG PO TABS
1.0000 | ORAL_TABLET | ORAL | Status: DC | PRN
  Administered 2024-02-07: 2 via ORAL
  Administered 2024-02-08: 1 via ORAL
  Administered 2024-02-09: 2 via ORAL
  Filled 2024-02-07: qty 2
  Filled 2024-02-07: qty 1
  Filled 2024-02-07 (×2): qty 2

## 2024-02-07 MED ORDER — METRONIDAZOLE 500 MG/100ML IV SOLN
500.0000 mg | Freq: Two times a day (BID) | INTRAVENOUS | Status: DC
  Administered 2024-02-07 – 2024-02-09 (×4): 500 mg via INTRAVENOUS
  Filled 2024-02-07 (×4): qty 100

## 2024-02-07 MED ORDER — ACETAMINOPHEN 650 MG RE SUPP: 650.0000 mg | Freq: Four times a day (QID) | RECTAL | Status: DC | PRN

## 2024-02-07 MED ORDER — VANCOMYCIN HCL 2000 MG/400ML IV SOLN
2000.0000 mg | Freq: Once | INTRAVENOUS | Status: AC
  Administered 2024-02-07: 2000 mg via INTRAVENOUS
  Filled 2024-02-07: qty 400

## 2024-02-07 MED ORDER — SODIUM CHLORIDE 0.9 % IV SOLN
2.0000 g | INTRAVENOUS | Status: DC
  Administered 2024-02-08: 2 g via INTRAVENOUS
  Filled 2024-02-07: qty 20

## 2024-02-07 MED ORDER — ACETAMINOPHEN 325 MG PO TABS: 650.0000 mg | ORAL_TABLET | Freq: Four times a day (QID) | ORAL | Status: DC | PRN

## 2024-02-07 MED ORDER — METRONIDAZOLE 500 MG/100ML IV SOLN
500.0000 mg | Freq: Once | INTRAVENOUS | Status: AC
  Administered 2024-02-07: 500 mg via INTRAVENOUS
  Filled 2024-02-07: qty 100

## 2024-02-07 MED ORDER — SODIUM CHLORIDE 0.9% FLUSH
3.0000 mL | Freq: Two times a day (BID) | INTRAVENOUS | Status: DC
  Administered 2024-02-07 – 2024-02-09 (×5): 3 mL via INTRAVENOUS

## 2024-02-07 MED ORDER — METOPROLOL SUCCINATE ER 50 MG PO TB24
100.0000 mg | ORAL_TABLET | Freq: Every day | ORAL | Status: DC
Start: 2024-02-08 — End: 2024-02-09
  Administered 2024-02-08 – 2024-02-09 (×2): 100 mg via ORAL
  Filled 2024-02-07 (×2): qty 1
  Filled 2024-02-07: qty 2

## 2024-02-07 MED ORDER — INSULIN ASPART 100 UNIT/ML IJ SOLN
0.0000 [IU] | Freq: Three times a day (TID) | INTRAMUSCULAR | Status: DC
  Administered 2024-02-07: 5 [IU] via SUBCUTANEOUS
  Administered 2024-02-08: 3 [IU] via SUBCUTANEOUS
  Administered 2024-02-08: 8 [IU] via SUBCUTANEOUS
  Administered 2024-02-08 – 2024-02-09 (×3): 5 [IU] via SUBCUTANEOUS

## 2024-02-07 MED ORDER — MIRTAZAPINE 15 MG PO TABS
45.0000 mg | ORAL_TABLET | Freq: Every day | ORAL | Status: DC
  Administered 2024-02-07 – 2024-02-08 (×2): 45 mg via ORAL
  Filled 2024-02-07 (×2): qty 3

## 2024-02-07 MED ORDER — VANCOMYCIN HCL 750 MG/150ML IV SOLN
750.0000 mg | Freq: Two times a day (BID) | INTRAVENOUS | Status: DC
  Administered 2024-02-08 – 2024-02-09 (×3): 750 mg via INTRAVENOUS
  Filled 2024-02-07 (×5): qty 150

## 2024-02-07 MED ORDER — TETANUS-DIPHTH-ACELL PERTUSSIS 5-2.5-18.5 LF-MCG/0.5 IM SUSY
0.5000 mL | PREFILLED_SYRINGE | Freq: Once | INTRAMUSCULAR | Status: AC
  Administered 2024-02-07: 0.5 mL via INTRAMUSCULAR
  Filled 2024-02-07: qty 0.5

## 2024-02-07 MED ORDER — POLYETHYLENE GLYCOL 3350 17 G PO PACK: 17.0000 g | PACK | Freq: Every day | ORAL | Status: DC | PRN

## 2024-02-07 NOTE — Consult Note (Signed)
 [facility] [place]  ASSESSMENT / PLAN: 68 y.o. male with right diabetic foot ulcer. He has palpable pedal pulses PT>DP. Suspect he will heal if toe amputation is necessary. Will check ABI. Will follow up ABI results and follow peripherally until these are complete.  CHIEF COMPLAINT: right foot ulcer  HISTORY OF PRESENT ILLNESS: Michael Hartman is a 68 y.o. male who presents to the emergency department for evaluation of a left second toe diabetic foot ulcer.  The patient has mild cognitive impairment.  He is with his wife, who supports his history.  He reports minor trauma to the left foot about 2 weeks ago.  The ulcer developed about a week ago and is worsened.  Patient's history is significant for coronary artery disease, diabetes, sleep apnea, hyperlipidemia, A-fib status post ablation,   Past Medical History:  Diagnosis Date   Allergies    Arthritis    Atrial fibrillation    Atrial flutter    a. diagnosed in 11/2016 --> started on Eliquis  for anticoagulation b. s/p DCCV in 01/2017   CAD (coronary artery disease) 08/02/2007   Celiac disease    Chronic insomnia    Chronic rhinitis 08/02/2007   Coronary artery disease    a. s/p remote stenting of RCA in 1998   Diabetes mellitus type 2 03/23/2012   Erectile dysfunction    Essential hypertension 08/02/2007   Gastro-esophageal reflux disease with esophagitis 09/19/2020   History of migraine headaches    MCI (mild cognitive impairment) 09/19/2020   OSA (obstructive sleep apnea)    uses CPAP regularly   Pure hypercholesterolemia 08/02/2007   Secondary hypercoagulable state 07/26/2020   Sinusitis    Stage 2 chronic kidney disease     Past Surgical History:  Procedure Laterality Date   ANKLE ARTHROPLASTY     ATRIAL FIBRILLATION ABLATION N/A 10/24/2020   Procedure: ATRIAL FIBRILLATION ABLATION;  Surgeon: Inocencio Soyla Lunger, MD;  Location: [facility];  Service: Cardiovascular;  Laterality: N/A;    CARDIAC CATHETERIZATION  06/06/1997   EF 65%   CARDIOVASCULAR STRESS TEST  12/07/2007   EF 58%   CARDIOVERSION N/A 01/08/2017   Procedure: CARDIOVERSION;  Surgeon: Delford Maude BROCKS, MD;  Location: [facility] ENDOSCOPY;  Service: Cardiovascular;  Laterality: N/A;   CARDIOVERSION N/A 08/01/2020   Procedure: CARDIOVERSION;  Surgeon: Loni Soyla LABOR, MD;  Location: [facility] ENDOSCOPY;  Service: Cardiovascular;  Laterality: N/A;   CORONARY STENT PLACEMENT     ELBOW ARTHROPLASTY     EYE SURGERY     KNEE ARTHROPLASTY     low back surgery     NASAL SINUS SURGERY     REFRACTIVE SURGERY     SHOULDER ACROMIOPLASTY     SPINE SURGERY      Family History  Problem Relation Age of Onset   Hypertension Mother    Diabetes Mother    Alzheimer's disease Mother    Gallstones Mother    Ulcers Mother    Arthritis Mother    Stroke Father    Hypertension Father    Heart attack Father    Hypertension Brother     Social History   Socioeconomic History   Marital status: Married    Spouse name: Shawnee   Number of children: 0   Years of education: 16   Highest education level: Bachelor's degree (e.g., BA, AB, BS)  Occupational History   Occupation: Retired    Associate Professor: [employer]    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 [employer] Strain: Not on [employer] Insecurity: Not on file  Transportation Needs: Not on file  Physical Activity: Not on  file  Stress: Not on file  Social Connections: Not on file  Intimate Partner Violence: Not on file    Allergies  Allergen Reactions   Ambien [Zolpidem Tartrate]     Amnesia, odd behavior   Amoxicillin Rash   Penicillins Rash    Child hood    Current Facility-Administered Medications  Medication Dose Route Frequency Provider Last Rate Last Admin   metroNIDAZOLE  (FLAGYL ) IVPB 500 mg  500 mg Intravenous Once Steinl, Kevin, MD 100 mL/hr at 02/07/24 1459 500 mg at 02/07/24 1459   Tdap (BOOSTRIX ) injection 0.5 mL  0.5 mL Intramuscular Once Steinl, Kevin, MD       vancomycin  LUCRECIA) IVPB 2000 mg/400 mL  2,000 mg Intravenous Once Steinl, Kevin, MD       Current Outpatient Medications  Medication Sig Dispense Refill   acetaminophen  (TYLENOL ) 500 MG tablet Take 1,000 mg by mouth every 6 (six) hours as needed for mild pain or headache.     atorvastatin  (LIPITOR) 80 MG tablet Take 1 tablet (80 mg total) by mouth daily. 90 tablet 3   diltiazem  (CARDIZEM  CD) 180 MG 24 hr capsule Take 1 capsule (180 mg total) by mouth daily. 90 capsule 3   docusate sodium (COLACE) 100 MG capsule Take 100 mg by mouth in the morning.     donepezil  (ARICEPT ) 10 MG tablet Take 1 tablet (10 mg total) by mouth at bedtime. Follow instructions provided for the first month of treatment. 90 tablet 3   ELIQUIS  5 MG TABS tablet TAKE 1 TABLET BY MOUTH TWICE  DAILY 180 tablet 3   escitalopram (LEXAPRO) 10 MG tablet Take 10 mg by mouth daily.     FARXIGA 10 MG TABS tablet Take 10 mg by mouth in the morning.     fluticasone  (FLONASE ) 50 MCG/ACT nasal spray Place 1 spray into both nostrils at bedtime.     glipiZIDE (GLUCOTROL XL) 10 MG 24 hr tablet Take 10 mg by mouth daily.     JARDIANCE 25 MG TABS tablet Take 25 mg by mouth daily.     metFORMIN  (GLUCOPHAGE ) 1000 MG tablet Take 1,000 mg by mouth 2 (two) times daily.     methocarbamol (ROBAXIN) 500 MG tablet Take 500 mg by mouth 2 (two) times daily as needed for muscle  spasms.     metoprolol  succinate (TOPROL -XL) 100 MG 24 hr tablet TAKE 1 TABLET BY MOUTH DAILY 90 tablet 3   mirtazapine  (REMERON ) 45 MG tablet Take 45 mg by mouth at bedtime.     Multiple Vitamin (MULTIVITAMIN WITH MINERALS) TABS tablet Take 1 tablet by mouth in the morning.     nitroGLYCERIN  (NITROSTAT ) 0.4  MG SL tablet Place 1 tablet (0.4 mg total) under the tongue every 5 (five) minutes as needed for chest pain. 25 tablet 11   Olmesartan -amLODIPine -HCTZ (TRIBENZOR) 40-5-12.5 MG TABS Take 1 tablet by mouth in the morning.     ONE TOUCH ULTRA TEST test strip 1 each by Other route as needed. Use 1 strip to check glucose daily 100 each 4   pioglitazone (ACTOS) 30 MG tablet Take 30 mg by mouth daily.     TRADJENTA 5 MG TABS tablet Take 5 mg by mouth in the morning.      PHYSICAL EXAM Vitals:   02/07/24 1345 02/07/24 1415 02/07/24 1430 02/07/24 1445  BP: 139/83 134/86 (!) 140/87 (!) 148/91  Pulse: (!) 57 (!) 57 (!) 57 (!) 58  Resp:      Temp:      SpO2: 100% 98% 96% 96%  Weight:      Height:       Elderly man in no distress Regular rate and rhythm Unlabored breathing 2+ pedal pulses bilaterally DPs and PTs Plantar left second toe with ulceration   PERTINENT LABORATORY AND RADIOLOGIC DATA  Most recent CBC    Latest Ref Rng & Units 02/07/2024    1:38 PM 06/23/2023    2:59 PM 10/15/2021    7:49 PM  CBC  WBC 4.0 - 10.5 K/uL 4.0  4.9  4.3   Hemoglobin 13.0 - 17.0 g/dL 85.6  85.0  87.6   Hematocrit 39.0 - 52.0 % 43.1  45.0  37.6   Platelets 150 - 400 K/uL 308  270  236      Most recent CMP    Latest Ref Rng & Units 02/07/2024    1:38 PM 06/23/2023    2:59 PM 10/15/2021    7:49 PM  CMP  Glucose 70 - 99 mg/dL 766  751  869   BUN 8 - 23 mg/dL 12  20  17    Creatinine 0.61 - 1.24 mg/dL 8.76  8.76  9.04   Sodium 135 - 145 mmol/L 134  140  143   Potassium 3.5 - 5.1 mmol/L 3.9  4.6  3.7   Chloride 98 - 111 mmol/L 95  99  110   CO2 22 - 32 mmol/L 22  22  27    Calcium  8.9 - 10.3 mg/dL  9.5  89.9  9.3   Total Protein 6.5 - 8.1 g/dL 7.0   6.4   Total Bilirubin 0.0 - 1.2 mg/dL 1.5   1.3   Alkaline Phos 38 - 126 U/L 89   52   AST 15 - 41 U/L 22   26   ALT 0 - 44 U/L 16   21     Renal function Estimated Creatinine Clearance: 73.4 mL/min (by C-G formula based on SCr of 1.23 mg/dL).  No results found for: HGBA1C  LDL Calculated  Date Value Ref Range Status  11/18/2016 54 0 - 99 mg/dL Final    Debby SAILOR. Magda, MD FACS [facility] Phone Number: [phone] 02/07/2024 3:21 PM   Total time spent on preparing this encounter including chart review, data review, collecting history, examining the patient, and coordinating care: 60 minutes  Portions of this report may have been transcribed using voice recognition software.  Every effort has been made to ensure accuracy; however, inadvertent computerized transcription errors may still be present.

## 2024-02-07 NOTE — Progress Notes (Addendum)
 Pharmacy Antibiotic Note  Michael Hartman is a 68 y.o. male for which pharmacy has been consulted for vancomycin  dosing for DFI.  Patient with a history of HTN, HLD, DM, AF/Flutter, GERD, CAD. Patient presenting with foot/toe ulcer.  SCr 1.23 WBC 4; LA 1.6; T 98.3; HR 62; RR 20  Plan: Metronidazole  per MD Ceftriaxone  per MD Vancomycin  2000 mg once then 750 mg q12hr (eAUC 472.3) unless change in renal function Monitor WBC, fever, renal function, cultures De-escalate when able Levels at steady state  Height: 6' 2 (188 cm) Weight: 108.9 kg (240 lb) IBW/kg (Calculated) : 82.2  Temp (24hrs), Avg:98 F (36.7 C), Min:98 F (36.7 C), Max:98 F (36.7 C)  Recent Labs  Lab 02/07/24 1338 02/07/24 1352  WBC 4.0  --   CREATININE 1.23  --   LATICACIDVEN  --  1.6    Estimated Creatinine Clearance: 73.4 mL/min (by C-G formula based on SCr of 1.23 mg/dL).    Allergies  Allergen Reactions   Ambien [Zolpidem Tartrate]     Amnesia, odd behavior   Amoxicillin Rash   Penicillins Rash    Child hood   Microbiology results: Pending  Thank you for allowing pharmacy to be a part of this patient's care.  Dorn Buttner, PharmD, BCPS 02/07/2024 3:49 PM ED Clinical Pharmacist -  [phone]

## 2024-02-07 NOTE — H&P (Addendum)
 History and Physical   Michael Hartman FMW:[id number] DOB: [birth year] DOA: 02/07/2024  PCP: Rolinda Millman, MD   Patient coming from: Home  Chief Complaint: Foot/toe ulcer  HPI: [name] is a 68 y.o. male with medical history significant of hypertension, hyperlipidemia, diabetes, atrial fibrillation/flutter, GERD, CAD, mild cognitive impairment, history of psychosis, OSA, celiac disease, low back pain presenting with left toe ulcer.  Patient has noted a toe ulcer on the left for about a week.  Has had some worsening redness and pain.  Redness now streaking into the foot/ankle.  Has been on doxycycline  for the last 4 days which was started by provider at [facility].  Has not improved on these antibiotics and the erythema has in fact progressed.  Denies fevers, chills, chest pain, shortness of breath, abdominal pain, constipation, diarrhea, nausea, vomiting.  ED Course: Vital signs in the ED notable for blood pressure in the 130s-140 systolic, heart rate in the 50s-60s.  Lab workup included CMP with sodium 134, chloride 95, glucose 233, T. bili 1.5.  CBC within normal limits.  Blood culture pending.  Foot x-ray showed no acute dramality.  MR foot pending.  ABI pending.  Patient received ceftriaxone , Flagyl , vancomycin  in the ED.  Vascular surgery of left the consult and will follow peripherally pending ABIs in case patient needs amputation.  Although patient did see orthopedic surgery recently.  Review of Systems: As per HPI otherwise all other systems reviewed and are negative.  Past Medical History:  Diagnosis Date   Allergies    Arthritis    Atrial fibrillation    Atrial flutter    a. diagnosed in 11/2016 --> started on Eliquis  for anticoagulation b. s/p DCCV in 01/2017   CAD (coronary artery disease) 08/02/2007   Celiac disease    Chronic insomnia    Chronic rhinitis 08/02/2007   Coronary artery disease    a. s/p remote stenting of RCA in 1998   Diabetes mellitus type 2  03/23/2012   Erectile dysfunction    Essential hypertension 08/02/2007   Gastro-esophageal reflux disease with esophagitis 09/19/2020   History of migraine headaches    MCI (mild cognitive impairment) 09/19/2020   OSA (obstructive sleep apnea)    uses CPAP regularly   Pure hypercholesterolemia 08/02/2007   Secondary hypercoagulable state 07/26/2020   Sinusitis    Stage 2 chronic kidney disease     Past Surgical History:  Procedure Laterality Date   ANKLE ARTHROPLASTY     ATRIAL FIBRILLATION ABLATION N/A 10/24/2020   Procedure: ATRIAL FIBRILLATION ABLATION;  Surgeon: Inocencio Soyla Lunger, MD;  Location: [facility] INVASIVE CV LAB;  Service: Cardiovascular;  Laterality: N/A;   CARDIAC CATHETERIZATION  06/06/1997   EF 65%   CARDIOVASCULAR STRESS TEST  12/07/2007   EF 58%   CARDIOVERSION N/A 01/08/2017   Procedure: CARDIOVERSION;  Surgeon: Delford Maude BROCKS, MD;  Location: [facility] ENDOSCOPY;  Service: Cardiovascular;  Laterality: N/A;   CARDIOVERSION N/A 08/01/2020   Procedure: CARDIOVERSION;  Surgeon: Loni Soyla LABOR, MD;  Location: [facility] ENDOSCOPY;  Service: Cardiovascular;  Laterality: N/A;   CORONARY STENT PLACEMENT     ELBOW ARTHROPLASTY     EYE SURGERY     KNEE ARTHROPLASTY     low back surgery     NASAL SINUS SURGERY     REFRACTIVE SURGERY     SHOULDER ACROMIOPLASTY     SPINE SURGERY      Social History  reports that he quit smoking about 38 years ago. His smoking use  included cigarettes. He started smoking about 58 years ago. He has a 80 pack-year smoking history. He has quit using smokeless tobacco. He reports that he does not currently use drugs. He reports that he does not drink alcohol.  Allergies  Allergen Reactions   Ambien [Zolpidem Tartrate]     Amnesia, odd behavior   Amoxicillin Rash   Penicillins Rash    Child hood    Family History  Problem Relation Age of Onset   Hypertension Mother    Diabetes Mother    Alzheimer's disease Mother    Gallstones Mother     Ulcers Mother    Arthritis Mother    Stroke Father    Hypertension Father    Heart attack Father    Hypertension Brother   Reviewed on admission  Prior to Admission medications   Medication Sig Start Date End Date Taking? Authorizing Provider  acetaminophen  (TYLENOL ) 500 MG tablet Take 1,000 mg by mouth every 6 (six) hours as needed for mild pain or headache.    [provider]  atorvastatin  (LIPITOR) 80 MG tablet Take 1 tablet (80 mg total) by mouth daily. 04/15/22   [place], Peter M, MD  diltiazem  (CARDIZEM  CD) 180 MG 24 hr capsule Take 1 capsule (180 mg total) by mouth daily. 04/16/23   Jerilynn Lamarr HERO, NP  docusate sodium (COLACE) 100 MG capsule Take 100 mg by mouth in the morning.    [provider]  donepezil  (ARICEPT ) 10 MG tablet Take 1 tablet (10 mg total) by mouth at bedtime. Follow instructions provided for the first month of treatment. 09/21/23   Lomax, Amy, NP  ELIQUIS  5 MG TABS tablet TAKE 1 TABLET BY MOUTH TWICE  DAILY 11/24/23   Jerilynn Lamarr HERO, NP  escitalopram (LEXAPRO) 10 MG tablet Take 10 mg by mouth daily. 04/14/23   [provider]  FARXIGA 10 MG TABS tablet Take 10 mg by mouth in the morning. 06/23/20   [provider]  fluticasone  (FLONASE ) 50 MCG/ACT nasal spray Place 1 spray into both nostrils at bedtime.    [provider]  glipiZIDE (GLUCOTROL XL) 10 MG 24 hr tablet Take 10 mg by mouth daily. 10/24/22   [provider]  JARDIANCE 25 MG TABS tablet Take 25 mg by mouth daily. 02/12/23   [provider]  metFORMIN  (GLUCOPHAGE ) 1000 MG tablet Take 1,000 mg by mouth 2 (two) times daily. 04/07/21   [provider]  methocarbamol (ROBAXIN) 500 MG tablet Take 500 mg by mouth 2 (two) times daily as needed for muscle spasms.    [provider]  metoprolol  succinate (TOPROL -XL) 100 MG 24 hr tablet TAKE 1 TABLET BY MOUTH DAILY 07/28/23   Camnitz, Soyla Lunger, MD  mirtazapine  (REMERON ) 45 MG tablet  Take 45 mg by mouth at bedtime. 05/13/21   [provider]  Multiple Vitamin (MULTIVITAMIN WITH MINERALS) TABS tablet Take 1 tablet by mouth in the morning.    [provider]  nitroGLYCERIN  (NITROSTAT ) 0.4 MG SL tablet Place 1 tablet (0.4 mg total) under the tongue every 5 (five) minutes as needed for chest pain. 03/24/19   [place], Peter M, MD  Olmesartan -amLODIPine -HCTZ (TRIBENZOR) 40-5-12.5 MG TABS Take 1 tablet by mouth in the morning.    [provider]  ONE TOUCH ULTRA TEST test strip 1 each by Other route as needed. Use 1 strip to check glucose daily 01/23/17   Smith, Kristi M, MD  pioglitazone (ACTOS) 30 MG tablet Take 30 mg by  mouth daily.    [provider]  TRADJENTA 5 MG TABS tablet Take 5 mg by mouth in the morning. 03/03/12   [provider]    Physical Exam: Vitals:   02/07/24 1345 02/07/24 1415 02/07/24 1430 02/07/24 1445  BP: 139/83 134/86 (!) 140/87 (!) 148/91  Pulse: (!) 57 (!) 57 (!) 57 (!) 58  Resp:      Temp:      SpO2: 100% 98% 96% 96%  Weight:      Height:        Physical Exam Constitutional:      General: He is not in acute distress.    Appearance: Normal appearance.  HENT:     Head: Normocephalic and atraumatic.     Mouth/Throat:     Mouth: Mucous membranes are moist.     Pharynx: Oropharynx is clear.  Eyes:     Extraocular Movements: Extraocular movements intact.     Pupils: Pupils are equal, round, and reactive to light.  Cardiovascular:     Rate and Rhythm: Normal rate and regular rhythm.     Pulses: Normal pulses.     Heart sounds: Normal heart sounds.  Pulmonary:     Effort: Pulmonary effort is normal. No respiratory distress.     Breath sounds: Normal breath sounds.  Abdominal:     General: Bowel sounds are normal. There is no distension.     Palpations: Abdomen is soft.     Tenderness: There is no abdominal tenderness.  Musculoskeletal:        General: No swelling or deformity.     Comments:  Ulcerated left second toe on the plantar surface.  Erythema of the toe, streaking up the foot and into the ankle.  Skin:    General: Skin is warm and dry.  Neurological:     General: No focal deficit present.     Mental Status: Mental status is at baseline.        Labs on Admission: I have personally reviewed following labs and imaging studies  CBC: Recent Labs  Lab 02/07/24 1338  WBC 4.0  HGB 14.3  HCT 43.1  MCV 87.1  PLT 308    Basic Metabolic Panel: Recent Labs  Lab 02/07/24 1338  NA 134*  K 3.9  CL 95*  CO2 22  GLUCOSE 233*  BUN 12  CREATININE 1.23  CALCIUM  9.5    GFR: Estimated Creatinine Clearance: 73.4 mL/min (by C-G formula based on SCr of 1.23 mg/dL).  Liver Function Tests: Recent Labs  Lab 02/07/24 1338  AST 22  ALT 16  ALKPHOS 89  BILITOT 1.5*  PROT 7.0  ALBUMIN 3.5    Urine analysis:    Component Value Date/Time   COLORURINE YELLOW 10/15/2021 2100   APPEARANCEUR CLEAR 10/15/2021 2100   LABSPEC 1.020 10/15/2021 2100   PHURINE 5.0 10/15/2021 2100   GLUCOSEU NEGATIVE 10/15/2021 2100   HGBUR SMALL (A) 10/15/2021 2100   BILIRUBINUR NEGATIVE 10/15/2021 2100   KETONESUR NEGATIVE 10/15/2021 2100   PROTEINUR 100 (A) 10/15/2021 2100   UROBILINOGEN 0.2 07/17/2009 2303   NITRITE NEGATIVE 10/15/2021 2100   LEUKOCYTESUR NEGATIVE 10/15/2021 2100    Radiological Exams on Admission: DG Foot Complete Left Result Date: 02/07/2024 CLINICAL DATA:  [id number] Osteomyelitis [facility]) [id number] Infection [id number] Ulcerated, foot (HCC) 813103 EXAM: DG FOOT COMPLETE 3+V*L* COMPARISON:  X-ray left foot 08/21/2014 FINDINGS: Limited evaluation due to overlapping osseous structures and overlying soft tissues. No acute cortical erosion or destruction. There  is no evidence of fracture or dislocation. There is no evidence of arthropathy or other focal bone abnormality. Lateral forefoot subcutaneus soft tissue edema. IMPRESSION: No acute displaced fracture or  dislocation. No radiographic findings to suggest osteomyelitis. Limited evaluation due to overlapping osseous structures and overlying soft tissues. Electronically Signed   By: Morgane  Naveau M.D.   On: 02/07/2024 14:58   EKG: Not yet performed in Emergency Department.  Assessment/Plan Principal Problem:   Diabetic foot infection (HCC) Active Problems:   Pure hypercholesterolemia   OSA (obstructive sleep apnea)   Essential hypertension   CAD (coronary artery disease)   Type 2 diabetes mellitus with obesity (HCC)   Celiac disease   Gastro-esophageal reflux disease with esophagitis   MCI (mild cognitive impairment)   Persistent atrial fibrillation (HCC)   Psychosis (HCC)   Diabetic foot infection > Known ulcer for about a week.  Has seen orthopedic surgery and started on doxycycline  for 4 days without improvement.  > Continued pain and now erythema is extending to the foot/ankle.  > X-ray looks okay.  MRI pending.  ABI pending. > Vascular surgery consulted and are following peripherally pending ABI.  However orthopedic surgery had seen patient outpatient so if he needs amputation it would need to be figured out who will perform. > Patient seen vancomycin , ceftriaxone , Flagyl  for diabetic foot infection. - Monitor on telemetry overnight - Appreciate vascular surgery recommendations - Continue with vancomycin , ceftriaxone , Flagyl  - Trend fever curve and WBC - Follow-up blood cultures - Follow-up MR foot - Follow-up ABI  Mild cognitive impairment Behavior changes > Has diagnosis of mild cognitive impairment in chart.  Family have noticed behavior changes for the past couple years. > Reportedly has had significant fluctuations in mood from aggressive to or normal/calm.  Has been ongoing for couple years.  There was also report of some decrease in inhibition at times. > Has follow-up with neurology in the past and had MRI done 2 years ago that showed no acute abnormality.  Reportedly  has follow-up with neurology scheduled for November, family has some concern he will not make this appointment. > Patient gets upset if family member talks about this in front of him, further conversations would need to be had separately at least initially and then could address with patient as appropriate.   - Continue with home donepezil , Remeron ,  - Confirm if taking Lexapro  Diabetes > On multiple p.o. medications at home.  Patient wife states she is unsure received taking them as he still has uncontrolled A1c.  If the doctor suggest that she help with his medications he does not want to see that doctor. - SSI  Hypertension - Continue diltiazem , metoprolol   Hyperlipidemia - Continue atorvastatin   Atrial fibrillation/flutter - Continue home diltiazem , metoprolol  - Holding Eliquis   CAD - Continue metoprolol  - Holding Eliquis   Mild cognitive impairment History of psychosis - Continue home donepezil  - Continue home Remeron , Lexapro  Celiac disease > History of this.  Patient denies being on gluten-free diet and declines that here. - Noted  DVT prophylaxis: SCDs Code Status:   Full Family Communication:  Updated at bedside  Disposition Plan:   Patient is from:  Home  Anticipated DC to:  Home  Anticipated DC date:  1 to 3 days  Anticipated DC barriers: None  Consults called:  Vascular surgery consulted in ED, following peripherally. Admission status:  Observation, telemetry  Severity of Illness: The appropriate patient status for this patient is OBSERVATION. Observation status is judged  to be reasonable and necessary in order to provide the required intensity of service to ensure the patient's safety. The patient's presenting symptoms, physical exam findings, and initial radiographic and laboratory data in the context of their medical condition is felt to place them at decreased risk for further clinical deterioration. Furthermore, it is anticipated that the patient will be  medically stable for discharge from the hospital within 2 midnights of admission.    Marsa KATHEE Scurry MD [facility]  How to contact the [facility] Attending or Consulting provider 7A - 7P or covering provider during after hours 7P -7A, for this patient?   Check the care team in [facility] and look for a) attending/consulting [facility] provider listed and b) the [facility] team listed Log into [email or web address] and use [facility] universal password to access. If you do not have the password, please contact the hospital operator. Locate the [facility] provider you are looking for under [facility] and page to a number that you can be directly reached. If you still have difficulty reaching the provider, please page the [facility] (Director on Call) for the Hospitalists listed on amion for assistance.  02/07/2024, 3:42 PM

## 2024-02-07 NOTE — ED Provider Notes (Addendum)
 [facility] AT [facility] Provider Note   CSN: [id number] Arrival date & time: 02/07/24  1251     Patient presents with: ulcer on toe    Michael Hartman is a 68 y.o. male.   Pt with hx DM with c/o ulcer to left 2nd toe that he has been aware of for ~  1 week, and had developed redness, soreness to foot extending to lower leg.  Has been on doxycycline  for 4 days but notes symptoms not improving. No fever or chills. No nv.   The history is provided by the patient, the spouse and medical records.       Prior to Admission medications   Medication Sig Start Date End Date Taking? Authorizing Provider  acetaminophen  (TYLENOL ) 500 MG tablet Take 1,000 mg by mouth every 6 (six) hours as needed for mild pain or headache.    [provider]  atorvastatin  (LIPITOR) 80 MG tablet Take 1 tablet (80 mg total) by mouth daily. 04/15/22   [place], Peter M, MD  diltiazem  (CARDIZEM  CD) 180 MG 24 hr capsule Take 1 capsule (180 mg total) by mouth daily. 04/16/23   Jerilynn Lamarr HERO, NP  docusate sodium (COLACE) 100 MG capsule Take 100 mg by mouth in the morning.    [provider]  donepezil  (ARICEPT ) 10 MG tablet Take 1 tablet (10 mg total) by mouth at bedtime. Follow instructions provided for the first month of treatment. 09/21/23   Lomax, Amy, NP  ELIQUIS  5 MG TABS tablet TAKE 1 TABLET BY MOUTH TWICE  DAILY 11/24/23   Jerilynn Lamarr HERO, NP  escitalopram (LEXAPRO) 10 MG tablet Take 10 mg by mouth daily. 04/14/23   [provider]  FARXIGA 10 MG TABS tablet Take 10 mg by mouth in the morning. 06/23/20   [provider]  fluticasone  (FLONASE ) 50 MCG/ACT nasal spray Place 1 spray into both nostrils at bedtime.    [provider]  glipiZIDE (GLUCOTROL XL) 10 MG 24 hr tablet Take 10 mg by mouth daily. 10/24/22   [provider]  JARDIANCE 25 MG TABS tablet Take 25 mg by mouth daily. 02/12/23   [provider]  metFORMIN   (GLUCOPHAGE ) 1000 MG tablet Take 1,000 mg by mouth 2 (two) times daily. 04/07/21   [provider]  methocarbamol (ROBAXIN) 500 MG tablet Take 500 mg by mouth 2 (two) times daily as needed for muscle spasms.    [provider]  metoprolol  succinate (TOPROL -XL) 100 MG 24 hr tablet TAKE 1 TABLET BY MOUTH DAILY 07/28/23   Camnitz, Soyla Lunger, MD  mirtazapine  (REMERON ) 45 MG tablet Take 45 mg by mouth at bedtime. 05/13/21   [provider]  Multiple Vitamin (MULTIVITAMIN WITH MINERALS) TABS tablet Take 1 tablet by mouth in the morning.    [provider]  nitroGLYCERIN  (NITROSTAT ) 0.4 MG SL tablet Place 1 tablet (0.4 mg total) under the tongue every 5 (five) minutes as needed for chest pain. 03/24/19   [place], Peter M, MD  Olmesartan -amLODIPine -HCTZ (TRIBENZOR) 40-5-12.5 MG TABS Take 1 tablet by mouth in the morning.    [provider]  ONE TOUCH ULTRA TEST test strip 1 each by Other route as needed. Use 1 strip to check glucose daily 01/23/17   Smith, Kristi M, MD  pioglitazone (ACTOS) 30 MG tablet Take 30 mg by mouth daily.    [provider]  TRADJENTA 5 MG TABS tablet Take 5 mg by mouth in the morning.  03/03/12   [provider]    Allergies: Ambien [zolpidem tartrate], Amoxicillin, and Penicillins    Review of Systems  Constitutional:  Negative for fever.  Respiratory:  Negative for shortness of breath.   Cardiovascular:  Negative for chest pain.  Gastrointestinal:  Negative for abdominal pain and vomiting.    Updated Vital Signs BP (!) 140/87   Pulse (!) 57   Temp 98 F (36.7 C)   Resp 17   Ht 1.88 m (6' 2)   Wt 108.9 kg   SpO2 96%   BMI 30.81 kg/m   Physical Exam Vitals and nursing note reviewed.  Constitutional:      Appearance: Normal appearance. He is well-developed.  HENT:     Head: Atraumatic.     Nose: Nose normal.     Mouth/Throat:     Mouth: Mucous membranes are moist.  Eyes:     General: No scleral  icterus.    Conjunctiva/sclera: Conjunctivae normal.  Neck:     Trachea: No tracheal deviation.  Cardiovascular:     Rate and Rhythm: Normal rate.     Pulses: Normal pulses.  Pulmonary:     Effort: Pulmonary effort is normal. No accessory muscle usage or respiratory distress.  Musculoskeletal:     Cervical back: Neck supple. No rigidity.     Comments: Swelling, redness, tenderness to left 2nd toe, with ulceration to plantar aspect with erythema and tenderness of foot extending up to distal lower leg. Dp/pt palp. No malodor.   Skin:    General: Skin is warm and dry.     Findings: No rash.  Neurological:     Mental Status: He is alert.     Comments: Alert, speech clear.   Psychiatric:        Mood and Affect: Mood normal.     (all labs ordered are listed, but only abnormal results are displayed) Results for orders placed or performed during the hospital encounter of 02/07/24  CBC   Collection Time: 02/07/24  1:38 PM  Result Value Ref Range   WBC 4.0 4.0 - 10.5 K/uL   RBC 4.95 4.22 - 5.81 MIL/uL   Hemoglobin 14.3 13.0 - 17.0 g/dL   HCT 56.8 60.9 - 47.9 %   MCV 87.1 80.0 - 100.0 fL   MCH 28.9 26.0 - 34.0 pg   MCHC 33.2 30.0 - 36.0 g/dL   RDW 87.1 88.4 - 84.4 %   Platelets 308 150 - 400 K/uL   nRBC 0.0 0.0 - 0.2 %  Comprehensive metabolic panel with GFR   Collection Time: 02/07/24  1:38 PM  Result Value Ref Range   Sodium 134 (L) 135 - 145 mmol/L   Potassium 3.9 3.5 - 5.1 mmol/L   Chloride 95 (L) 98 - 111 mmol/L   CO2 22 22 - 32 mmol/L   Glucose, Bld 233 (H) 70 - 99 mg/dL   BUN 12 8 - 23 mg/dL   Creatinine, Ser 8.76 0.61 - 1.24 mg/dL   Calcium  9.5 8.9 - 10.3 mg/dL   Total Protein 7.0 6.5 - 8.1 g/dL   Albumin 3.5 3.5 - 5.0 g/dL   AST 22 15 - 41 U/L   ALT 16 0 - 44 U/L   Alkaline Phosphatase 89 38 - 126 U/L   Total Bilirubin 1.5 (H) 0.0 - 1.2 mg/dL   GFR, Estimated >39 >39 mL/min   Anion gap 17 (H) 5 - 15  I-Stat Lactic Acid, ED   Collection Time: 02/07/24  1:52 PM   Result Value Ref Range   Lactic Acid, Venous 1.6 0.5 - 1.9 mmol/L      EKG: None  Radiology: No results found.   Procedures   Medications Ordered in the ED  cefTRIAXone  (ROCEPHIN ) 2 g in sodium chloride  0.9 % 100 mL IVPB (0 g Intravenous Stopped 02/07/24 1451)    And  metroNIDAZOLE  (FLAGYL ) IVPB 500 mg (has no administration in time range)  vancomycin  (VANCOREADY) IVPB 2000 mg/400 mL (has no administration in time range)  Tdap (BOOSTRIX ) injection 0.5 mL (has no administration in time range)                                    Medical Decision Making Problems Addressed: Cellulitis of left foot: acute illness or injury with systemic symptoms that poses a threat to life or bodily functions Diabetic foot infection (HCC): acute illness or injury with systemic symptoms that poses a threat to life or bodily functions Hyperglycemia: acute illness or injury Skin ulcer of toe of left foot with fat layer exposed (HCC): acute illness or injury  Amount and/or Complexity of Data Reviewed Independent Historian: spouse    Details: hx External Data Reviewed: notes. Labs: ordered. Decision-making details documented in ED Course. Radiology: ordered and independent interpretation performed. Decision-making details documented in ED Course. Discussion of management or test interpretation with external provider(s): Vascular, medicine  Risk Prescription drug management. Decision regarding hospitalization.  Iv ns. Continuous pulse ox and cardiac monitoring. Labs ordered/sent. Imaging ordered.   Differential diagnosis includes  diabetic foot infection, diabetic ulcer, cellulitis, osteomyelitis, etc. Dispo decision including potential need for admission considered - will get labs and imaging and reassess.   Reviewed nursing notes and prior charts for additional history. External reports reviewed. Additional history from: spouse.   Tetanus unknown. Tetanus IM.  Cultures sent. Labs sent. Iv  abx.   Cardiac monitor: sinus rhythm, rate 68.  Labs reviewed/interpreted by me - wbc and hgb normal. Lactate normal.   Xrays reviewed/interpreted by me - no fx or fb.  MRI reviewed/interpreted by me - pnd.  Vascular nav consulted as part of LE infection/ulcer orderset. Discussed pt, they will see.  Medicine consulted for admission.        Final diagnoses:  Diabetic foot infection (HCC)  Skin ulcer of toe of left foot with fat layer exposed (HCC)  Cellulitis of left foot    ED Discharge Orders     None          Bernard Drivers, MD 02/07/24 1502

## 2024-02-07 NOTE — ED Triage Notes (Addendum)
 Patient states he has a sore on his foot that started about a week ago. There is drainage, warmth and redness.  Patient states that it is like a hole/tunnel.  Patient is diabetic.

## 2024-02-08 ENCOUNTER — Encounter: Payer: Self-pay | Admitting: Family Medicine

## 2024-02-08 DIAGNOSIS — F29 Unspecified psychosis not due to a substance or known physiological condition: Secondary | ICD-10-CM

## 2024-02-08 DIAGNOSIS — I4819 Other persistent atrial fibrillation: Secondary | ICD-10-CM | POA: Diagnosis not present

## 2024-02-08 DIAGNOSIS — E1169 Type 2 diabetes mellitus with other specified complication: Secondary | ICD-10-CM | POA: Diagnosis not present

## 2024-02-08 DIAGNOSIS — E11628 Type 2 diabetes mellitus with other skin complications: Secondary | ICD-10-CM | POA: Diagnosis not present

## 2024-02-08 DIAGNOSIS — G3184 Mild cognitive impairment, so stated: Secondary | ICD-10-CM | POA: Diagnosis not present

## 2024-02-08 LAB — GLUCOSE, CAPILLARY
Glucose-Capillary: 197 mg/dL — ABNORMAL HIGH (ref 70–99)
Glucose-Capillary: 187 mg/dL — ABNORMAL HIGH (ref 70–99)
Glucose-Capillary: 283 mg/dL — ABNORMAL HIGH (ref 70–99)
Glucose-Capillary: 250 mg/dL — ABNORMAL HIGH (ref 70–99)
Glucose-Capillary: 298 mg/dL — ABNORMAL HIGH (ref 70–99)

## 2024-02-08 LAB — CBC
HCT: 40.8 % (ref 39.0–52.0)
Hemoglobin: 13.3 g/dL (ref 13.0–17.0)
MCH: 28.7 pg (ref 26.0–34.0)
MCHC: 32.6 g/dL (ref 30.0–36.0)
MCV: 87.9 fL (ref 80.0–100.0)
Platelets: 256 K/uL (ref 150–400)
RBC: 4.64 MIL/uL (ref 4.22–5.81)
RDW: 12.8 % (ref 11.5–15.5)
WBC: 4.1 K/uL (ref 4.0–10.5)
nRBC: 0 % (ref 0.0–0.2)

## 2024-02-08 LAB — COMPREHENSIVE METABOLIC PANEL WITH GFR
ALT: 14 U/L (ref 0–44)
AST: 16 U/L (ref 15–41)
Albumin: 3.2 g/dL — ABNORMAL LOW (ref 3.5–5.0)
Alkaline Phosphatase: 74 U/L (ref 38–126)
Anion gap: 14 (ref 5–15)
BUN: 15 mg/dL (ref 8–23)
CO2: 25 mmol/L (ref 22–32)
Calcium: 9.2 mg/dL (ref 8.9–10.3)
Chloride: 101 mmol/L (ref 98–111)
Creatinine, Ser: 1.25 mg/dL — ABNORMAL HIGH (ref 0.61–1.24)
GFR, Estimated: >60 mL/min (ref >60)
Glucose, Bld: 188 mg/dL — ABNORMAL HIGH (ref 70–99)
Potassium: 4.4 mmol/L (ref 3.5–5.1)
Sodium: 140 mmol/L (ref 135–145)
Total Bilirubin: 1.3 mg/dL — ABNORMAL HIGH (ref 0.0–1.2)
Total Protein: 6.4 g/dL — ABNORMAL LOW (ref 6.5–8.1)

## 2024-02-08 LAB — HEMOGLOBIN A1C
Hgb A1c MFr Bld: 10.8 % — ABNORMAL HIGH (ref 4.8–5.6)
Mean Plasma Glucose: 263.26 mg/dL

## 2024-02-08 LAB — C-REACTIVE PROTEIN: CRP: 0.6 mg/dL (ref <1.0)

## 2024-02-08 LAB — HIV ANTIBODY (ROUTINE TESTING W REFLEX): HIV Screen 4th Generation wRfx: NONREACTIVE

## 2024-02-08 LAB — SEDIMENTATION RATE: Sed Rate: 35 mm/h — ABNORMAL HIGH (ref 0–16)

## 2024-02-08 MED ORDER — ENSURE PLUS HIGH PROTEIN PO LIQD
237.0000 mL | Freq: Two times a day (BID) | ORAL | Status: DC
  Administered 2024-02-08 – 2024-02-09 (×2): 237 mL via ORAL
  Filled 2024-02-08: qty 237

## 2024-02-08 MED ORDER — CEFAZOLIN SODIUM-DEXTROSE 2-4 GM/100ML-% IV SOLN
2.0000 g | INTRAVENOUS | Status: AC
  Administered 2024-02-09: 2 g via INTRAVENOUS
  Filled 2024-02-08: qty 100

## 2024-02-08 MED ORDER — SODIUM CHLORIDE 0.9 % IV SOLN
2.0000 g | Freq: Three times a day (TID) | INTRAVENOUS | Status: DC
  Administered 2024-02-08 – 2024-02-09 (×4): 2 g via INTRAVENOUS
  Filled 2024-02-08 (×4): qty 12.5

## 2024-02-08 MED ORDER — CHLORHEXIDINE GLUCONATE 4 % EX SOLN
60.0000 mL | Freq: Once | CUTANEOUS | Status: AC
  Administered 2024-02-09: 4 via TOPICAL
  Filled 2024-02-08: qty 60

## 2024-02-08 MED ORDER — ACETAMINOPHEN 500 MG PO TABS
1000.0000 mg | ORAL_TABLET | Freq: Once | ORAL | Status: AC
  Administered 2024-02-09: 1000 mg via ORAL
  Filled 2024-02-08: qty 2

## 2024-02-08 MED ORDER — INSULIN GLARGINE 100 UNIT/ML ~~LOC~~ SOLN
10.0000 [IU] | Freq: Every day | SUBCUTANEOUS | Status: DC
  Administered 2024-02-08 – 2024-02-09 (×2): 10 [IU] via SUBCUTANEOUS
  Filled 2024-02-08 (×2): qty 0.1

## 2024-02-08 NOTE — Anesthesia Preprocedure Evaluation (Signed)
 Anesthesia Evaluation  Patient identified by MRN, date of birth, ID band Patient awake    Reviewed: Allergy & Precautions, NPO status , Patient's Chart, lab work & pertinent test results, reviewed documented beta blocker date and time   Airway Mallampati: I  TM Distance: >3 FB Neck ROM: Full    Dental  (+) Dental Advisory Given, Missing   Pulmonary sleep apnea and Continuous Positive Airway Pressure Ventilation , former smoker   Pulmonary exam normal breath sounds clear to auscultation       Cardiovascular hypertension, Pt. on medications and Pt. on home beta blockers + CAD and + Cardiac Stents (RCA)  Normal cardiovascular exam+ dysrhythmias Atrial Fibrillation  Rhythm:Regular Rate:Normal     Neuro/Psych negative neurological ROS     GI/Hepatic negative GI ROS, Neg liver ROS,,,  Endo/Other  diabetes, Type 2, Oral Hypoglycemic Agents    Renal/GU Renal InsufficiencyRenal disease     Musculoskeletal  (+) Arthritis ,  Left toe ulcer    Abdominal   Peds  Hematology  (+) Blood dyscrasia (Eliquis )   Anesthesia Other Findings   Reproductive/Obstetrics                              Anesthesia Physical Anesthesia Plan  ASA: 3  Anesthesia Plan: General   Post-op Pain Management: Tylenol  PO (pre-op)*   Induction: Intravenous  PONV Risk Score and Plan: 2 and Dexamethasone  and Ondansetron   Airway Management Planned: LMA  Additional Equipment:   Intra-op Plan:   Post-operative Plan: Extubation in OR  Informed Consent: I have reviewed the patients History and Physical, chart, labs and discussed the procedure including the risks, benefits and alternatives for the proposed anesthesia with the patient or authorized representative who has indicated his/her understanding and acceptance.     Dental advisory given  Plan Discussed with: CRNA  Anesthesia Plan Comments:           Anesthesia Quick Evaluation

## 2024-02-08 NOTE — Care Plan (Signed)
 Orthopaedic Surgery Plan of Care Note   -history and imaging reviewed with requesting team (Hospitalist service) -pt has left 2nd toe diabetic foot full thickness ulcer -PMH includes cognitive impairment, DM-2, A-fib/flutter on Eliquis , CAD/remote stent, HTN, HLD, OSA not on CPAP, celiac disease and lower back pain  -plan for left 2nd toe amputation thru MTP disarticulation vs 2nd ray amputation -please keep NPO and hold VTE ppx from midnight -full consult note to follow   Lillia Mountain, MD Orthopaedic Surgery [facility]

## 2024-02-08 NOTE — Care Management Obs Status (Signed)
 MEDICARE OBSERVATION STATUS NOTIFICATION   Patient Details  Name: Michael Hartman MRN: [id number] Date of Birth: 1955-06-14   Medicare Observation Status Notification Given:  Yes    Bridget Cordella Simmonds, LCSW 02/08/2024, 12:48 PM

## 2024-02-08 NOTE — Plan of Care (Signed)
  Problem: Education: Goal: Knowledge of General Education information will improve Description: Including pain rating scale, medication(s)/side effects and non-pharmacologic comfort measures Outcome: Progressing   Problem: Health Behavior/Discharge Planning: Goal: Ability to manage health-related needs will improve Outcome: Progressing   Problem: Clinical Measurements: Goal: Ability to maintain clinical measurements within normal limits will improve Outcome: Progressing   Problem: Activity: Goal: Risk for activity intolerance will decrease Outcome: Progressing   Problem: Coping: Goal: Level of anxiety will decrease Outcome: Progressing   Problem: Pain Managment: Goal: General experience of comfort will improve and/or be controlled Outcome: Progressing
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
## 2024-02-09 ENCOUNTER — Encounter (HOSPITAL_COMMUNITY): Payer: Self-pay | Admitting: Internal Medicine

## 2024-02-09 ENCOUNTER — Inpatient Hospital Stay (HOSPITAL_COMMUNITY)

## 2024-02-09 ENCOUNTER — Inpatient Hospital Stay (HOSPITAL_COMMUNITY): Admitting: Anesthesiology

## 2024-02-09 ENCOUNTER — Encounter (HOSPITAL_COMMUNITY)

## 2024-02-09 ENCOUNTER — Other Ambulatory Visit: Payer: Self-pay

## 2024-02-09 ENCOUNTER — Observation Stay (HOSPITAL_COMMUNITY): Admitting: Anesthesiology

## 2024-02-09 ENCOUNTER — Encounter (HOSPITAL_COMMUNITY)
Admission: EM | Disposition: A | Discharge status: Home / Self Care | Payer: Self-pay | Source: Home / Self Care | Attending: Student

## 2024-02-09 ENCOUNTER — Observation Stay (HOSPITAL_COMMUNITY)

## 2024-02-09 DIAGNOSIS — Z833 Family history of diabetes mellitus: Secondary | ICD-10-CM | POA: Diagnosis not present

## 2024-02-09 DIAGNOSIS — I4892 Unspecified atrial flutter: Secondary | ICD-10-CM | POA: Diagnosis present

## 2024-02-09 DIAGNOSIS — M869 Osteomyelitis, unspecified: Secondary | ICD-10-CM | POA: Diagnosis present

## 2024-02-09 DIAGNOSIS — L089 Local infection of the skin and subcutaneous tissue, unspecified: Secondary | ICD-10-CM | POA: Diagnosis not present

## 2024-02-09 DIAGNOSIS — N182 Chronic kidney disease, stage 2 (mild): Secondary | ICD-10-CM | POA: Diagnosis present

## 2024-02-09 DIAGNOSIS — I2510 Atherosclerotic heart disease of native coronary artery without angina pectoris: Secondary | ICD-10-CM | POA: Diagnosis present

## 2024-02-09 DIAGNOSIS — F0390 Unspecified dementia without behavioral disturbance: Secondary | ICD-10-CM | POA: Diagnosis present

## 2024-02-09 DIAGNOSIS — Z7984 Long term (current) use of oral hypoglycemic drugs: Secondary | ICD-10-CM | POA: Diagnosis not present

## 2024-02-09 DIAGNOSIS — Z87891 Personal history of nicotine dependence: Secondary | ICD-10-CM | POA: Diagnosis not present

## 2024-02-09 DIAGNOSIS — Z79899 Other long term (current) drug therapy: Secondary | ICD-10-CM | POA: Diagnosis not present

## 2024-02-09 DIAGNOSIS — E7800 Pure hypercholesterolemia, unspecified: Secondary | ICD-10-CM | POA: Diagnosis present

## 2024-02-09 DIAGNOSIS — E1169 Type 2 diabetes mellitus with other specified complication: Secondary | ICD-10-CM

## 2024-02-09 DIAGNOSIS — E11628 Type 2 diabetes mellitus with other skin complications: Secondary | ICD-10-CM | POA: Diagnosis not present

## 2024-02-09 DIAGNOSIS — L03116 Cellulitis of left lower limb: Secondary | ICD-10-CM | POA: Diagnosis present

## 2024-02-09 DIAGNOSIS — L97529 Non-pressure chronic ulcer of other part of left foot with unspecified severity: Secondary | ICD-10-CM | POA: Diagnosis present

## 2024-02-09 DIAGNOSIS — G4733 Obstructive sleep apnea (adult) (pediatric): Secondary | ICD-10-CM | POA: Diagnosis present

## 2024-02-09 DIAGNOSIS — Z955 Presence of coronary angioplasty implant and graft: Secondary | ICD-10-CM | POA: Diagnosis not present

## 2024-02-09 DIAGNOSIS — L97519 Non-pressure chronic ulcer of other part of right foot with unspecified severity: Secondary | ICD-10-CM | POA: Diagnosis present

## 2024-02-09 DIAGNOSIS — I129 Hypertensive chronic kidney disease with stage 1 through stage 4 chronic kidney disease, or unspecified chronic kidney disease: Secondary | ICD-10-CM | POA: Diagnosis present

## 2024-02-09 DIAGNOSIS — E66811 Obesity, class 1: Secondary | ICD-10-CM | POA: Diagnosis present

## 2024-02-09 DIAGNOSIS — E11621 Type 2 diabetes mellitus with foot ulcer: Secondary | ICD-10-CM | POA: Diagnosis present

## 2024-02-09 DIAGNOSIS — Z23 Encounter for immunization: Secondary | ICD-10-CM | POA: Diagnosis present

## 2024-02-09 DIAGNOSIS — I4819 Other persistent atrial fibrillation: Secondary | ICD-10-CM | POA: Diagnosis present

## 2024-02-09 DIAGNOSIS — E1165 Type 2 diabetes mellitus with hyperglycemia: Secondary | ICD-10-CM | POA: Diagnosis present

## 2024-02-09 DIAGNOSIS — Z7901 Long term (current) use of anticoagulants: Secondary | ICD-10-CM | POA: Diagnosis not present

## 2024-02-09 DIAGNOSIS — E1122 Type 2 diabetes mellitus with diabetic chronic kidney disease: Secondary | ICD-10-CM | POA: Diagnosis present

## 2024-02-09 DIAGNOSIS — Z8249 Family history of ischemic heart disease and other diseases of the circulatory system: Secondary | ICD-10-CM | POA: Diagnosis not present

## 2024-02-09 HISTORY — PX: AMPUTATION TOE: SHX6595

## 2024-02-09 LAB — GLUCOSE, CAPILLARY
Glucose-Capillary: 234 mg/dL — ABNORMAL HIGH (ref 70–99)
Glucose-Capillary: 230 mg/dL — ABNORMAL HIGH (ref 70–99)
Glucose-Capillary: 232 mg/dL — ABNORMAL HIGH (ref 70–99)
Glucose-Capillary: 204 mg/dL — ABNORMAL HIGH (ref 70–99)

## 2024-02-09 SURGERY — AMPUTATION, TOE
Anesthesia: General | Site: Toe | Laterality: Left

## 2024-02-09 MED ORDER — CHLORHEXIDINE GLUCONATE 0.12 % MT SOLN
15.0000 mL | Freq: Once | OROMUCOSAL | Status: AC
Start: 2024-02-09 — End: 2024-02-09
  Administered 2024-02-09: 15 mL via OROMUCOSAL
  Filled 2024-02-09: qty 15

## 2024-02-09 MED ORDER — FENTANYL CITRATE (PF) 250 MCG/5ML IJ SOLN
INTRAMUSCULAR | Status: DC | PRN
  Administered 2024-02-09: 50 ug via INTRAVENOUS

## 2024-02-09 MED ORDER — PHENYLEPHRINE 80 MCG/ML (10ML) SYRINGE FOR IV PUSH (FOR BLOOD PRESSURE SUPPORT)
PREFILLED_SYRINGE | INTRAVENOUS | Status: DC | PRN
  Administered 2024-02-09 (×2): 80 ug via INTRAVENOUS

## 2024-02-09 MED ORDER — VANCOMYCIN HCL 1000 MG IV SOLR
INTRAVENOUS | Status: AC
  Filled 2024-02-09: qty 20

## 2024-02-09 MED ORDER — LIDOCAINE 2% (20 MG/ML) 5 ML SYRINGE
INTRAMUSCULAR | Status: DC | PRN
  Administered 2024-02-09: 100 mg via INTRAVENOUS

## 2024-02-09 MED ORDER — OXYCODONE HCL 5 MG PO TABS
5.0000 mg | ORAL_TABLET | Freq: Once | ORAL | Status: AC
  Administered 2024-02-09: 5 mg via ORAL

## 2024-02-09 MED ORDER — PROPOFOL 10 MG/ML IV BOLUS
INTRAVENOUS | Status: AC
  Filled 2024-02-09: qty 20

## 2024-02-09 MED ORDER — PHENYLEPHRINE HCL (PRESSORS) 10 MG/ML IV SOLN
INTRAVENOUS | Status: AC
Start: 2024-02-09 — End: 2024-02-09
  Filled 2024-02-09: qty 1

## 2024-02-09 MED ORDER — ONDANSETRON HCL 4 MG/2ML IJ SOLN
INTRAMUSCULAR | Status: DC | PRN
  Administered 2024-02-09: 4 mg via INTRAVENOUS

## 2024-02-09 MED ORDER — FENTANYL CITRATE (PF) 100 MCG/2ML IJ SOLN: 25.0000 ug | INTRAMUSCULAR | Status: DC | PRN

## 2024-02-09 MED ORDER — INSULIN STARTER KIT- PEN NEEDLES (ENGLISH)
1.0000 | Freq: Once | Status: AC
  Administered 2024-02-09: 1
  Filled 2024-02-09: qty 1

## 2024-02-09 MED ORDER — ONDANSETRON HCL 4 MG/2ML IJ SOLN: 4.0000 mg | Freq: Once | INTRAMUSCULAR | Status: DC | PRN

## 2024-02-09 MED ORDER — LIVING WELL WITH DIABETES BOOK
Freq: Once | Status: AC
  Filled 2024-02-09: qty 1

## 2024-02-09 MED ORDER — INSULIN ASPART 100 UNIT/ML IJ SOLN
INTRAMUSCULAR | Status: AC
  Filled 2024-02-09: qty 1

## 2024-02-09 MED ORDER — LACTATED RINGERS IV SOLN: INTRAVENOUS | Status: DC

## 2024-02-09 MED ORDER — FENTANYL CITRATE (PF) 250 MCG/5ML IJ SOLN
INTRAMUSCULAR | Status: AC
Start: 2024-02-09 — End: 2024-02-09
  Filled 2024-02-09: qty 5

## 2024-02-09 MED ORDER — ORAL CARE MOUTH RINSE: 15.0000 mL | Freq: Once | OROMUCOSAL | Status: AC

## 2024-02-09 MED ORDER — OXYCODONE HCL 5 MG PO TABS
ORAL_TABLET | ORAL | Status: AC
  Filled 2024-02-09: qty 1

## 2024-02-09 MED ORDER — DOXYCYCLINE MONOHYDRATE 100 MG PO CAPS: 100.0000 mg | ORAL_CAPSULE | Freq: Two times a day (BID) | ORAL | 0 refills | Status: AC

## 2024-02-09 MED ORDER — VANCOMYCIN HCL 1000 MG IV SOLR
INTRAVENOUS | Status: DC | PRN
  Administered 2024-02-09: 1000 mg

## 2024-02-09 MED ORDER — PROPOFOL 10 MG/ML IV BOLUS
INTRAVENOUS | Status: DC | PRN
  Administered 2024-02-09: 160 mg via INTRAVENOUS

## 2024-02-09 MED ORDER — INSULIN ASPART 100 UNIT/ML IJ SOLN
0.0000 [IU] | INTRAMUSCULAR | Status: DC | PRN
  Administered 2024-02-09: 3 [IU] via SUBCUTANEOUS

## 2024-02-09 SURGICAL SUPPLY — 30 items
BLADE SURG 21 STRL SS (BLADE) ×1 IMPLANT
BNDG COHESIVE 4X5 TAN STRL LF (GAUZE/BANDAGES/DRESSINGS) ×1 IMPLANT
BNDG COHESIVE 6X5 TAN NS LF (GAUZE/BANDAGES/DRESSINGS) IMPLANT
BNDG COMPR ESMARK 6X3 LF (GAUZE/BANDAGES/DRESSINGS) IMPLANT
BNDG ELASTIC 4INX 5YD STR LF (GAUZE/BANDAGES/DRESSINGS) IMPLANT
BNDG GAUZE DERMACEA FLUFF 4 (GAUZE/BANDAGES/DRESSINGS) ×1 IMPLANT
CLEANSER WND VASHE 34 (WOUND CARE) IMPLANT
COVER SURGICAL LIGHT HANDLE (MISCELLANEOUS) ×1 IMPLANT
DRAPE U-SHAPE 47X51 STRL (DRAPES) ×1 IMPLANT
DRSG ADAPTIC 3X8 NADH LF (GAUZE/BANDAGES/DRESSINGS) IMPLANT
DURAPREP 26ML APPLICATOR (WOUND CARE) ×1 IMPLANT
ELECTRODE REM PT RTRN 9FT ADLT (ELECTROSURGICAL) ×1 IMPLANT
GAUZE PAD ABD 8X10 STRL (GAUZE/BANDAGES/DRESSINGS) ×1 IMPLANT
GAUZE SPONGE 4X4 12PLY STRL (GAUZE/BANDAGES/DRESSINGS) IMPLANT
GOWN STRL REUS W/ TWL XL LVL3 (GOWN DISPOSABLE) ×2 IMPLANT
KIT BASIN OR (CUSTOM PROCEDURE TRAY) ×1 IMPLANT
KIT TURNOVER KIT B (KITS) ×1 IMPLANT
MANIFOLD NEPTUNE II (INSTRUMENTS) ×1 IMPLANT
NDL 22X1.5 STRL (OR ONLY) (MISCELLANEOUS) IMPLANT
NEEDLE 22X1.5 STRL (OR ONLY) (MISCELLANEOUS) IMPLANT
NS IRRIG 1000ML POUR BTL (IV SOLUTION) ×1 IMPLANT
PACK ORTHO EXTREMITY (CUSTOM PROCEDURE TRAY) ×1 IMPLANT
PAD ABD 8X10 STRL (GAUZE/BANDAGES/DRESSINGS) IMPLANT
PAD ARMBOARD POSITIONER FOAM (MISCELLANEOUS) ×2 IMPLANT
SET CYSTO W/LG BORE CLAMP LF (SET/KITS/TRAYS/PACK) IMPLANT
SUT ETHILON 2 0 PSLX (SUTURE) ×1 IMPLANT
SUT PROLENE 2 0 CT2 30 (SUTURE) IMPLANT
SYR CONTROL 10ML LL (SYRINGE) IMPLANT
TOWEL GREEN STERILE (TOWEL DISPOSABLE) ×1 IMPLANT
TUBE CONNECTING 20X1/4 (TUBING) IMPLANT

## 2024-02-09 NOTE — Evaluation (Signed)
 Physical Therapy Evaluation Patient Details Name: Michael Hartman MRN: [id number] DOB: August 25, 1955 Today's Date: 02/09/2024  History of Present Illness  68 y.o. male presents to [facility] hospital on 02/07/2024 with L toe ulcer. Pt underwent L 2nd toe amputation through MTP on 9/2. PMH includes HTN, HLD, DM, afib, GERD, CAD, mild cognitive impairment, psychosis, OSA, celiac disease.  Clinical Impression  Pt presents to PT with deficits in cognition, gait, balance, endurance. Pt has a history of mild cognitive impairment, does not recall having toe amputation earlier today. Pt requires multiple cues to avoid rolling to forefoot when walking to maintain heel WB through LLE. PT provides education to the patient and pt's spouse on weightbearing restrictions and stair negotiation technique so that pt's spouse can reinforce the restrictions due to pt's memory deficits. PT encourages the pt to utilize his [facility] at the time of discharge to improve stability and to offload LLE.      If plan is discharge home, recommend the following: A little help with walking and/or transfers;A little help with bathing/dressing/bathroom;Assistance with cooking/housework;Direct supervision/assist for medications management;Direct supervision/assist for financial management;Assist for transportation;Help with stairs or ramp for entrance;Supervision due to cognitive status   Can travel by private vehicle        Equipment Recommendations None recommended by PT (recommend pt utilize his [facility])  Recommendations for Other Services       Functional Status Assessment Patient has had a recent decline in their functional status and demonstrates the ability to make significant improvements in function in a reasonable and predictable amount of time.     Precautions / Restrictions Precautions Precautions: Fall Recall of Precautions/Restrictions: Impaired Precaution/Restrictions Comments: pt is unaware that he had his toe amputated Required  Braces or Orthoses: Other Brace Other Brace: DARCO shoe with extended heel to offlaod forefoot Restrictions Weight Bearing Restrictions Per Provider Order: Yes LLE Weight Bearing Per Provider Order:  (heel weightbearing in [facility] shoe)      Mobility  Bed Mobility Overal bed mobility: Modified Independent                  Transfers Overall transfer level: Needs assistance Equipment used: None Transfers: Sit to/from Stand Sit to Stand: Supervision                Ambulation/Gait Ambulation/Gait assistance: Supervision Gait Distance (Feet): 100 Feet Assistive device: None Gait Pattern/deviations: Step-through pattern Gait velocity: reduced Gait velocity interpretation: 1.31 - 2.62 ft/sec, indicative of limited community ambulator   General Gait Details: pt requires verbal cues to avoid rolling to forefoot on LLE when ambulating. PT is able to intermittently follow.  Stairs Stairs: Yes Stairs assistance: Contact guard assist Stair Management: One rail Right, Step to pattern, Forwards Number of Stairs: 2 General stair comments: PT provides demonstration and verbal cues for sequencing  Wheelchair Mobility     Tilt Bed    Modified Rankin (Stroke Patients Only)       Balance Overall balance assessment: Needs assistance Sitting-balance support: No upper extremity supported, Feet supported Sitting balance-Leahy Scale: Good     Standing balance support: No upper extremity supported, During functional activity Standing balance-Leahy Scale: Fair                               Pertinent Vitals/Pain Pain Assessment Pain Assessment: 0-10 Pain Score: 5  Pain Location: L foot Pain Descriptors / Indicators: Sore Pain Intervention(s): Monitored during session  Home Living Family/patient expects to be discharged to:: Private residence Living Arrangements: Spouse/significant other Available Help at Discharge: Family;Available 24 hours/day Type  of Home: House Home Access: Stairs to enter Entrance Stairs-Rails: Right Entrance Stairs-Number of Steps: 3   Home Layout: One level Home Equipment: Cane - single point;Shower seat;Grab bars - tub/shower;Hand held shower head      Prior Function Prior Level of Function : Needs assist             Mobility Comments: ambuatory with PRN SPC ADLs Comments: pt with cognitive deficits, anticipate assistance required for IADLs     Extremity/Trunk Assessment   Upper Extremity Assessment Upper Extremity Assessment: Overall WFL for tasks assessed    Lower Extremity Assessment Lower Extremity Assessment: Overall WFL for tasks assessed (PT notes drainage on dressing after ambulating, RN present and checks dressing)    Cervical / Trunk Assessment Cervical / Trunk Assessment: Normal  Communication   Communication Communication: Impaired Factors Affecting Communication: Difficulty expressing self (inappropriate words at times)    Cognition Arousal: Alert Behavior During Therapy: WFL for tasks assessed/performed   PT - Cognitive impairments: Awareness, Orientation, Memory, Problem solving, Safety/Judgement   Orientation impairments: Situation                     Following commands: Impaired Following commands impaired: Follows one step commands with increased time     Cueing Cueing Techniques: Verbal cues, Visual cues     General Comments General comments (skin integrity, edema, etc.): VSS on RA    Exercises     Assessment/Plan    PT Assessment Patient needs continued PT services  PT Problem List Decreased balance;Decreased activity tolerance;Decreased mobility;Decreased cognition;Decreased knowledge of use of DME;Decreased safety awareness;Decreased knowledge of precautions;Pain       PT Treatment Interventions DME instruction;Gait training;Stair training;Functional mobility training;Therapeutic activities;Neuromuscular re-education;Therapeutic  exercise;Balance training;Cognitive remediation;Patient/family education    PT Goals (Current goals can be found in the Care Plan section)  Acute Rehab PT Goals Patient Stated Goal: to return home PT Goal Formulation: With patient/family Time For Goal Achievement: 02/23/24 Potential to Achieve Goals: Fair Additional Goals Additional Goal #1: Pt will independently verbalize weightbearing restrictions to LLE    Frequency Min 2X/week     Co-evaluation               AM-PAC PT 6 Clicks Mobility  Outcome Measure Help needed turning from your back to your side while in a flat bed without using bedrails?: None Help needed moving from lying on your back to sitting on the side of a flat bed without using bedrails?: None Help needed moving to and from a bed to a chair (including a wheelchair)?: A Little Help needed standing up from a chair using your arms (e.g., wheelchair or bedside chair)?: A Little Help needed to walk in hospital room?: A Little Help needed climbing 3-5 steps with a railing? : A Little 6 Click Score: 20    End of Session Equipment Utilized During Treatment: Gait belt Activity Tolerance: Patient tolerated treatment well Patient left: in chair;with call bell/phone within reach;with family/visitor present Nurse Communication: Mobility status PT Visit Diagnosis: Other abnormalities of gait and mobility (R26.89)    Time: 1550-1620 PT Time Calculation (min) (ACUTE ONLY): 30 min   Charges:   PT Evaluation $PT Eval Low Complexity: 1 Low   PT General Charges $$ ACUTE PT VISIT: 1 Visit         Bernardino JINNY Ruth, PT, DPT  Acute Rehabilitation Office [phone]   Bernardino JINNY Ruth 02/09/2024, 4:38 PM

## 2024-02-09 NOTE — Progress Notes (Signed)
 Orthopedic Tech Progress Note Patient Details:  Michael Hartman 11/05/1955 [id number]  Ortho Devices Type of Ortho Device: Darco shoe Ortho Device/Splint Location: LLE Ortho Device/Splint Interventions: Ordered, Application, Adjustment, Removal   Post Interventions Patient Tolerated: Well Instructions Provided: Care of device  [name] Pac 02/09/2024, 10:55 AM

## 2024-02-09 NOTE — Plan of Care (Signed)
  Problem: Education: Goal: Knowledge of General Education information will improve Description: Including pain rating scale, medication(s)/side effects and non-pharmacologic comfort measures Outcome: Progressing   Problem: Clinical Measurements: Goal: Ability to maintain clinical measurements within normal limits will improve Outcome: Progressing Goal: Diagnostic test results will improve Outcome: Progressing Goal: Respiratory complications will improve Outcome: Progressing Goal: Cardiovascular complication will be avoided Outcome: Progressing   Problem: Activity: Goal: Risk for activity intolerance will decrease Outcome: Progressing   

## 2024-02-09 NOTE — H&P (Signed)
 H&P Update:  -History and Physical Reviewed  -Patient has been re-examined  -No change in the plan of care  -The risks and benefits were presented and reviewed. The risks due to suture failure and/or irritation, new/persistent infection, stiffness, nerve/vessel/tendon injury or rerupture of repaired tendon, nonunion/malunion, allograft usage, wound healing issues, development of arthritis, failure of this surgery, possibility of external fixation with delayed definitive surgery, need for further surgery, thromboembolic events, anesthesia/medical complications, amputation, death among others were discussed. The patient acknowledged the explanation, agreed to proceed with the plan and a consent was signed.  Michael Hartman

## 2024-02-09 NOTE — Progress Notes (Signed)
 Attempted to do exam, however pt is being discharged. Glendell Schlottman, RVT

## 2024-02-09 NOTE — Discharge Summary (Signed)
 Physician Discharge Summary  Michael Hartman FMW:[id number] DOB: [birth year] DOA: 02/07/2024  PCP: [name] PARAS, MD  Admit date: 02/07/2024 Discharge date: 02/09/2024  Admitted from: Home Discharge disposition: Home   Recommendations at discharge:  Complete the course of antibiotics and 2 weeks of oral doxycycline  100 mg twice daily. Dressing change, reinforcement as needed Strict heel weightbearing and forefoot offloading postop shoe, maximal elevation To follow-up with orthopedics in the office in 7 to 10 days with the plan of suture removal in 2 to 3 weeks Pain control with as needed Norco  Brief narrative: Michael Hartman is a 68 y.o. male with PMH significant for obesity, OSA not on CPAP, DM2, HTN, HLD, CAD/stent, A-fib/flutter on Eliquis , CKD, GERD, arthritis, mild cognitive impairment, celiac disease, low back pain. Patient had a minor trauma on his left second toe 2 weeks ago.  1 week later, he noticed some redness and pain.  He was seen as an outpatient and started on course of doxycycline  as an outpatient for left second toe ulcer.  Despite 4 days of antibiotics, patient continued to have progressive redness and pain and hence presented to the ED on 8/31  In the ED, patient was hemodynamically stable Labs with WBC count normal X-ray without acute finding MRI left foot showed cellulitis and early osteomyelitis of left second toe Started on broad-spectrum antibiotics Admitted to [facility] Orthopedics and vascular surgery were consulted Underwent left second toe amputation and MTP disarticulation today  Subjective: Patient was seen and examined this morning after the surgery. Sitting up in bed in no acute distress.  Pain controlled. Chart reviewed Afebrile, blood pressure 150s and 160s Later this afternoon, patient was able to ambulate with PT Stable enough to go home  Hospital course: Left second toe osteomyelitis S/p left 2nd toe amputation and MTP disarticulation -9/2 Dr.  Barton Presented with progressive redness, pain not responding to outpatient antibiotics. Imagings as above Seen by vascular surgery and orthopedics. Did not need revascularization. Given IV cefepime , IV vancomycin  and IV Flagyl  Ambulated with PT this afternoon.  Per orthopedics, stable for discharge home today. Recommendations: Strict heel weightbearing and forefoot offloading postop shoe, maximal elevation To follow-up with orthopedics in the office in 7 to 10 days with the plan of suture removal in 2 to 3 weeks Pain control per orthopedics Oral doxycycline  100 mg twice daily for 2 weeks  Type 2 diabetes mellitus uncontrolled with hyperglycemia A1c 10.8 on 02/08/2024 PTA meds-on multiple oral agents but unclear compliance Currently on Semglee  10 units daily as well as SSI/Accu-Cheks. I discussed with patient about insulin .  He states he is not interested in it.  His wife at bedside suggested that patient does not have consistent compliance to oral antidiabetics.  After starting to improve compliance.  Patient also states he is going to improve his dietary habit. Recent Labs  Lab 02/08/24 1633 02/08/24 2041 02/09/24 0625 02/09/24 0703 02/09/24 1159  GLUCAP 250* 298* 234* 230* 232*    Paroxysmal A-fib/flutter Rate controlled. S/p ablation in 10/2020.   PTA meds- Cardizem , metoprolol , Eliquis  Continue all   CAD s/p stent HLD No anginal Symptoms.   PTA meds- Lipitor continue home meds   Essential hypertension Continue diltiazem , metoprolol    Cognitive impairment/dementia with intermittent mood disturbance.   Family has concerns about progressively worsening agitation, aggressiveness He has an upcoming appointment with neurology on 11/10. PTA meds- Lexapro, Aricept  and Remeron . Continue all  Celiac disease:  Patient denies being on gluten-free diet and declines  that here.   Class I obesity Body mass index is 30.81 kg/m.  Goals of care   Code Status: Full Code    Diet:  Diet Order             Diet Carb Modified Fluid consistency: Thin; Room service appropriate? Yes  Diet effective now           Diet general                   Nutritional status:  Body mass index is 30.81 kg/m.       Wounds:  - Wound 02/07/24 2048 Toe (Comment  which one) Anterior;Left (Active)  Date First Assessed/Time First Assessed: 02/07/24 2048   Present on Original Admission: Yes  Location: (c) Toe (Comment  which one)  Location Orientation: Anterior;Left    No assessment data to display     No associated orders.    Discharge Exam:   Vitals:   02/09/24 0815 02/09/24 0830 02/09/24 0840 02/09/24 0854  BP: (!) 147/81 (!) 159/85  (!) 159/85  Pulse: 64 62 65 (!) 59  Resp: 12 14 17 16   Temp: 98.8 F (37.1 C)  98.8 F (37.1 C) 97.7 F (36.5 C)  TempSrc:    Oral  SpO2: 96% 93% 94% 94%  Weight:      Height:        Body mass index is 30.81 kg/m.  General exam: Pleasant, elderly Caucasian male Skin: No rashes, lesions or ulcers. HEENT: Atraumatic, normocephalic, no obvious bleeding Lungs: Clear to auscultation bilaterally,  CVS: S1, S2, no murmur,   GI/Abd: Soft, nontender, nondistended, bowel sound present,   CNS: Alert, awake, oriented x 3 Psychiatry: Mood appropriate Extremities: No pedal edema, no calf tenderness, postsurgical status of left foot  Follow ups:    Follow-up Information     [name] PARAS, MD Follow up.   Specialty: Family Medicine Contact information: [address] [place] [address] [phone]                 Discharge Instructions:   Discharge Instructions     Call MD for:  difficulty breathing, headache or visual disturbances   Complete by: As directed    Call MD for:  extreme fatigue   Complete by: As directed    Call MD for:  hives   Complete by: As directed    Call MD for:  persistant dizziness or light-headedness   Complete by: As directed    Call MD for:  persistant nausea and  vomiting   Complete by: As directed    Call MD for:  severe uncontrolled pain   Complete by: As directed    Call MD for:  temperature >100.4   Complete by: As directed    Diet general   Complete by: As directed    Discharge instructions   Complete by: As directed    Recommendations at discharge:   Complete the course of antibiotics and 2 weeks of oral doxycycline  100 mg twice daily.  Dressing change, reinforcement as needed  Strict heel weightbearing and forefoot offloading postop shoe, maximal elevation  To follow-up with orthopedics in the office in 7 to 10 days with the plan of suture removal in 2 to 3 weeks  Pain control with as needed Norco  Discharge instructions for diabetes mellitus: Check blood sugar 3 times a day and bedtime at home. If blood sugar running above 200 or less than 70 please call your MD  to adjust insulin . If you notice signs and symptoms of hypoglycemia (low blood sugar) like jitteriness, confusion, thirst, tremor and sweating, please check blood sugar, drink sugary drink/biscuits/sweets to increase sugar level and call MD or return to ER.      General discharge instructions: Follow with Primary MD [name] PARAS, MD in 7 days  Please request your PCP  to go over your hospital tests, procedures, radiology results at the follow up. Please get your medicines reviewed and adjusted.  Your PCP may decide to repeat certain labs or tests as needed. Do not drive, operate heavy machinery, perform activities at heights, swimming or participation in water  activities or provide baby sitting services if your were admitted for syncope or siezures until you have seen by Primary MD or a Neurologist and advised to do so again. [place]  Controlled Substance Reporting System database was reviewed. Do not drive, operate heavy machinery, perform activities at heights, swim, participate in water  activities or provide baby-sitting services while on medications for pain,  sleep and mood until your outpatient physician has reevaluated you and advised to do so again.  You are strongly recommended to comply with the dose, frequency and duration of prescribed medications. Activity: As tolerated with Full fall precautions use walker/cane & assistance as needed Avoid using any recreational substances like cigarette, tobacco, alcohol, or non-prescribed drug. If you experience worsening of your admission symptoms, develop shortness of breath, life threatening emergency, suicidal or homicidal thoughts you must seek medical attention immediately by calling 911 or calling your MD immediately  if symptoms less severe. You must read complete instructions/literature along with all the possible adverse reactions/side effects for all the medicines you take and that have been prescribed to you. Take any new medicine only after you have completely understood and accepted all the possible adverse reactions/side effects.  Wear Seat belts while driving. You were cared for by a hospitalist during your hospital stay. If you have any questions about your discharge medications or the care you received while you were in the hospital after you are discharged, you can call the unit and ask to speak with the hospitalist or the covering physician. Once you are discharged, your primary care physician will handle any further medical issues. Please note that NO REFILLS for any discharge medications will be authorized once you are discharged, as it is imperative that you return to your primary care physician (or establish a relationship with a primary care physician if you do not have one).   Increase activity slowly   Complete by: As directed        Discharge Medications:   Allergies as of 02/09/2024       Reactions   Ambien [zolpidem Tartrate]    Amnesia, odd behavior   Amoxicillin Rash   Penicillins Rash   Child hood        Medication List     TAKE these medications    acetaminophen   500 MG tablet Commonly known as: TYLENOL  Take 1,000 mg by mouth every 6 (six) hours as needed for mild pain or headache.   atorvastatin  80 MG tablet Commonly known as: LIPITOR Take 1 tablet (80 mg total) by mouth daily.   diltiazem  180 MG 24 hr capsule Commonly known as: CARDIZEM  CD Take 1 capsule (180 mg total) by mouth daily.   donepezil  10 MG tablet Commonly known as: ARICEPT  Take 1 tablet (10 mg total) by mouth at bedtime. Follow instructions provided for the first month of treatment.  doxycycline  100 MG capsule Commonly known as: MONODOX  Take 1 capsule (100 mg total) by mouth 2 (two) times daily for 14 days.   Eliquis  5 MG Tabs tablet Generic drug: apixaban  TAKE 1 TABLET BY MOUTH TWICE  DAILY   escitalopram 10 MG tablet Commonly known as: LEXAPRO Take 10 mg by mouth daily.   glipiZIDE 10 MG 24 hr tablet Commonly known as: GLUCOTROL XL Take 10 mg by mouth daily.   Jardiance 25 MG Tabs tablet Generic drug: empagliflozin Take 25 mg by mouth daily.   metFORMIN  1000 MG tablet Commonly known as: GLUCOPHAGE  Take 1,000 mg by mouth 2 (two) times daily.   methocarbamol 500 MG tablet Commonly known as: ROBAXIN Take 500 mg by mouth 2 (two) times daily as needed for muscle spasms.   metoprolol  succinate 100 MG 24 hr tablet Commonly known as: TOPROL -XL TAKE 1 TABLET BY MOUTH DAILY   mirtazapine  45 MG tablet Commonly known as: REMERON  Take 45 mg by mouth at bedtime.   nitroGLYCERIN  0.4 MG SL tablet Commonly known as: NITROSTAT  Place 1 tablet (0.4 mg total) under the tongue every 5 (five) minutes as needed for chest pain.   pioglitazone 30 MG tablet Commonly known as: ACTOS Take 30 mg by mouth daily.         The results of significant diagnostics from this hospitalization (including imaging, microbiology, ancillary and laboratory) are listed below for reference.    Procedures and Diagnostic Studies:   MRI Left foot without contrast Result Date:  02/08/2024 CLINICAL DATA:  Soft tissue infection of the second toe. EXAM: MRI OF THE LEFT FOOT WITHOUT CONTRAST TECHNIQUE: Multiplanar, multisequence MR imaging of the left foot was performed. No intravenous contrast was administered. COMPARISON:  Foot x-ray 02/07/2024 FINDINGS: Bones/Joint/Cartilage No fracture or dislocation. Normal alignment. No joint effusion. Soft tissue edema of the second toe likely reflecting mild cellulitis. Mild soft marrow edema in the second distal phalanx and plantar base of the second middle phalanx which may be reactive versus secondary to early osteomyelitis. Mild osteoarthritis of the second, third and fourth TMT joints. Ligaments Collateral ligaments are intact.  Lisfranc ligament is intact. Muscles and Tendons Flexor, peroneal and extensor compartment tendons are intact. T2 hyperintense signal throughout the plantar musculature likely neurogenic with mild generalized muscle atrophy. Soft tissue No fluid collection or hematoma.  No soft tissue mass. IMPRESSION: 1. Soft tissue edema of the second toe likely reflecting mild cellulitis. Mild soft marrow edema in the second distal phalanx and plantar base of the second middle phalanx which may be reactive versus secondary to early osteomyelitis. No bone destruction or periosteal reaction. 2. Mild osteoarthritis of the second, third and fourth TMT joints. Electronically Signed   By: Julaine Blanch M.D.   On: 02/08/2024 08:14   DG Foot Complete Left Result Date: 02/07/2024 CLINICAL DATA:  [id number] Osteomyelitis [facility]) [id number] Infection [id number] Ulcerated, foot (HCC) [id number] EXAM: DG FOOT COMPLETE 3+V*L* COMPARISON:  X-ray left foot 08/21/2014 FINDINGS: Limited evaluation due to overlapping osseous structures and overlying soft tissues. No acute cortical erosion or destruction. There is no evidence of fracture or dislocation. There is no evidence of arthropathy or other focal bone abnormality. Lateral forefoot subcutaneus soft tissue  edema. IMPRESSION: No acute displaced fracture or dislocation. No radiographic findings to suggest osteomyelitis. Limited evaluation due to overlapping osseous structures and overlying soft tissues. Electronically Signed   By: Morgane  Naveau M.D.   On: 02/07/2024 14:58     Labs:   Basic  Metabolic Panel: Recent Labs  Lab 02/07/24 1338 02/08/24 0658  NA 134* 140  K 3.9 4.4  CL 95* 101  CO2 22 25  GLUCOSE 233* 188*  BUN 12 15  CREATININE 1.23 1.25*  CALCIUM  9.5 9.2   GFR Estimated Creatinine Clearance: 72.3 mL/min (A) (by C-G formula based on SCr of 1.25 mg/dL (H)). Liver Function Tests: Recent Labs  Lab 02/07/24 1338 02/08/24 0658  AST 22 16  ALT 16 14  ALKPHOS 89 74  BILITOT 1.5* 1.3*  PROT 7.0 6.4*  ALBUMIN 3.5 3.2*   No results for input(s): LIPASE, AMYLASE in the last 168 hours. No results for input(s): AMMONIA in the last 168 hours. Coagulation profile No results for input(s): INR, PROTIME in the last 168 hours.  CBC: Recent Labs  Lab 02/07/24 1338 02/08/24 0658  WBC 4.0 4.1  HGB 14.3 13.3  HCT 43.1 40.8  MCV 87.1 87.9  PLT 308 256   Cardiac Enzymes: No results for input(s): CKTOTAL, CKMB, CKMBINDEX, TROPONINI in the last 168 hours. BNP: Invalid input(s): POCBNP CBG: Recent Labs  Lab 02/08/24 1633 02/08/24 2041 02/09/24 0625 02/09/24 0703 02/09/24 1159  GLUCAP 250* 298* 234* 230* 232*   D-Dimer No results for input(s): DDIMER in the last 72 hours. Hgb A1c Recent Labs    02/08/24 0658  HGBA1C 10.8*   Lipid Profile No results for input(s): CHOL, HDL, LDLCALC, TRIG, CHOLHDL, LDLDIRECT in the last 72 hours. Thyroid  function studies No results for input(s): TSH, T4TOTAL, T3FREE, THYROIDAB in the last 72 hours.  Invalid input(s): FREET3 Anemia work up No results for input(s): VITAMINB12, FOLATE, FERRITIN, TIBC, IRON, RETICCTPCT in the last 72 hours. Microbiology Recent Results (from  the past 240 hours)  Blood Cultures x 2 sites     Status: None (Preliminary result)   Collection Time: 02/07/24  1:40 PM   Specimen: BLOOD LEFT HAND  Result Value Ref Range Status   Specimen Description BLOOD LEFT HAND  Final   Special Requests   Final    BOTTLES DRAWN AEROBIC AND ANAEROBIC Blood Culture results may not be optimal due to an inadequate volume of blood received in culture bottles   Culture   Final    NO GROWTH 2 DAYS Performed at [facility], [address], [place], [place] [address]    Report Status PENDING  Incomplete  Blood Cultures x 2 sites     Status: None (Preliminary result)   Collection Time: 02/07/24  1:45 PM   Specimen: BLOOD RIGHT HAND  Result Value Ref Range Status   Specimen Description BLOOD RIGHT HAND  Final   Special Requests   Final    BOTTLES DRAWN AEROBIC AND ANAEROBIC Blood Culture results may not be optimal due to an inadequate volume of blood received in culture bottles   Culture   Final    NO GROWTH 2 DAYS Performed at [facility], [address] [place], [place] [address]    Report Status PENDING  Incomplete    Time coordinating discharge: 45 minutes  Signed: Darlean Warmoth  [facility] 02/09/2024, 4:49 PM

## 2024-02-09 NOTE — Progress Notes (Signed)
 PROGRESS NOTE  Michael Hartman  DOB: May 28, 1956  PCP: Rolinda Millman, MD FMW:[id number]  DOA: 02/07/2024  LOS: 0 days  Hospital Day: 3  Brief narrative: Michael Hartman is a 68 y.o. male with PMH significant for obesity, OSA not on CPAP, DM2, HTN, HLD, CAD/stent, A-fib/flutter on Eliquis , CKD, GERD, arthritis, mild cognitive impairment, celiac disease, low back pain. Patient had a minor trauma on his left second toe 2 weeks ago.  1 week later, he noticed some redness and pain.  He was seen as an outpatient and started on course of doxycycline  as an outpatient for left second toe ulcer.  Despite 4 days of antibiotics, patient continued to have progressive redness and pain and hence presented to the ED on 8/31  In the ED, patient was hemodynamically stable Labs with WBC count normal X-ray without acute finding MRI left foot showed cellulitis and early osteomyelitis of left second toe Started on broad-spectrum antibiotics Admitted to [facility] Orthopedics and vascular surgery were consulted Underwent left second toe amputation and MTP disarticulation today  Subjective: Patient was seen and examined this morning after the surgery. Sitting up in bed in no acute distress.  Pain controlled. Chart reviewed Afebrile, blood pressure 150s and 160s   Assessment and plan: Left second toe osteomyelitis S/p left 2nd toe amputation and MTP disarticulation -9/2 Dr. Barton Presented with progressive redness, pain not responding to outpatient antibiotics. Imagings as above Seen by vascular surgery and orthopedics. Did not need revascularization. Currently on IV cefepime , IV vancomycin  and IV Flagyl  Pain regimen --- Scheduled: None --- PRN: Tylenol , Norco  Type 2 diabetes mellitus uncontrolled with hyperglycemia A1c 10.8 on 02/08/2024 PTA meds-on multiple oral agents but unclear compliance Currently on Semglee  10 units daily as well as SSI/Accu-Cheks. I discussed with patient about insulin .  He  states he is not interested in it.  His wife at bedside suggested that patient does not have consistent compliance to oral antidiabetics.  After starting to improve compliance.  Patient also states he is can improve his dietary habit Recent Labs  Lab 02/08/24 1633 02/08/24 2041 02/09/24 0625 02/09/24 0703 02/09/24 1159  GLUCAP 250* 298* 234* 230* 232*    Paroxysmal A-fib/flutter Rate controlled. S/p ablation in 10/2020.   PTA meds- Cardizem , metoprolol   Continued on both Chronically anticoagulated with Eliquis .   Currently on hold for procedure. Plan to resume prior to discharge   CAD s/p stent HLD No anginal Symptoms.   PTA meds- Lipitor continue home meds   Essential hypertension Continue diltiazem , metoprolol    Cognitive impairment/dementia with intermittent mood disturbance.   Family has concerns about progressively worsening agitation, aggressiveness He has an upcoming appointment with neurology on 11/10. PTA meds- Lexapro, Aricept  and Remeron . Continue the same.    Celiac disease:  Patient denies being on gluten-free diet and declines that here.   Class I obesity Body mass index is 30.81 kg/m.   Mobility: PT order seen PT Orders:   PT Follow up Rec:    Goals of care   Code Status: Full Code     DVT prophylaxis:  SCDs Start: 02/07/24 1537   Antimicrobials: IV antibiotics Fluid: None Consultants: Orthopedics Family Communication: Family at bedside  Status: Inpatient Level of care:  Telemetry Medical   Patient is from: Home Needs to continue in-hospital care: POD 0, pending PT eval Anticipated d/c to: Home eventually      Diet:  Diet Order  Diet Carb Modified Fluid consistency: Thin; Room service appropriate? Yes  Diet effective now                   Scheduled Meds:  atorvastatin   80 mg Oral Daily   diltiazem   180 mg Oral Daily   donepezil   10 mg Oral QHS   feeding supplement  237 mL Oral BID BM   insulin  aspart        insulin  aspart  0-15 Units Subcutaneous TID WC   insulin  glargine  10 Units Subcutaneous Daily   insulin  starter kit- pen needles  1 kit Other Once   metoprolol  succinate  100 mg Oral Daily   mirtazapine   45 mg Oral QHS   sodium chloride  flush  3 mL Intravenous Q12H    PRN meds: acetaminophen  **OR** acetaminophen , HYDROcodone -acetaminophen , insulin  aspart, polyethylene glycol   Infusions:   ceFEPime  (MAXIPIME ) IV 2 g (02/09/24 1202)   metronidazole  500 mg (02/09/24 0947)   vancomycin  750 mg (02/09/24 0551)    Antimicrobials: Anti-infectives (From admission, onward)    Start     Dose/Rate Route Frequency Ordered Stop   02/09/24 0804  vancomycin  (VANCOCIN ) powder  Status:  Discontinued          As needed 02/09/24 0805 02/09/24 0805   02/09/24 0600  ceFAZolin  (ANCEF ) IVPB 2g/100 mL premix        2 g 200 mL/hr over 30 Minutes Intravenous On call to O.R. 02/08/24 2211 02/09/24 0735   02/08/24 1200  ceFEPIme  (MAXIPIME ) 2 g in sodium chloride  0.9 % 100 mL IVPB        2 g 200 mL/hr over 30 Minutes Intravenous Every 8 hours 02/08/24 1044     02/08/24 1000  cefTRIAXone  (ROCEPHIN ) 2 g in sodium chloride  0.9 % 100 mL IVPB  Status:  Discontinued        2 g 200 mL/hr over 30 Minutes Intravenous Every 24 hours 02/07/24 1541 02/08/24 1040   02/08/24 0645  vancomycin  (VANCOREADY) IVPB 750 mg/150 mL       Placed in Followed by Linked Group   750 mg 150 mL/hr over 60 Minutes Intravenous Every 12 hours 02/07/24 1748     02/07/24 2200  metroNIDAZOLE  (FLAGYL ) IVPB 500 mg        500 mg 100 mL/hr over 60 Minutes Intravenous Every 12 hours 02/07/24 1541     02/07/24 1845  vancomycin  (VANCOREADY) IVPB 2000 mg/400 mL       Placed in Followed by Linked Group   2,000 mg 200 mL/hr over 120 Minutes Intravenous  Once 02/07/24 1748 02/07/24 2046   02/07/24 1400  vancomycin  (VANCOREADY) IVPB 2000 mg/400 mL  Status:  Discontinued        2,000 mg 200 mL/hr over 120 Minutes Intravenous  Once 02/07/24  1352 02/07/24 1748   02/07/24 1345  cefTRIAXone  (ROCEPHIN ) 2 g in sodium chloride  0.9 % 100 mL IVPB       Placed in And Linked Group   2 g 200 mL/hr over 30 Minutes Intravenous Once 02/07/24 1341 02/07/24 1451   02/07/24 1345  metroNIDAZOLE  (FLAGYL ) IVPB 500 mg       Placed in And Linked Group   500 mg 100 mL/hr over 60 Minutes Intravenous  Once 02/07/24 1341 02/07/24 1728   02/07/24 1345  vancomycin  (VANCOCIN ) IVPB 1000 mg/200 mL premix  Status:  Discontinued       Placed in And Linked Group   1,000 mg 200 mL/hr over 60  Minutes Intravenous  Once 02/07/24 1341 02/07/24 1352       Objective: Vitals:   02/09/24 0840 02/09/24 0854  BP:  (!) 159/85  Pulse: 65 (!) 59  Resp: 17 16  Temp: 98.8 F (37.1 C) 97.7 F (36.5 C)  SpO2: 94% 94%    Intake/Output Summary (Last 24 hours) at 02/09/2024 1429 Last data filed at 02/09/2024 1100 Gross per 24 hour  Intake 510 ml  Output 5 ml  Net 505 ml   Filed Weights   02/07/24 1313  Weight: 108.9 kg   Weight change:  Body mass index is 30.81 kg/m.   Physical Exam: General exam: Pleasant, elderly Caucasian male Skin: No rashes, lesions or ulcers. HEENT: Atraumatic, normocephalic, no obvious bleeding Lungs: Clear to auscultation bilaterally,  CVS: S1, S2, no murmur,   GI/Abd: Soft, nontender, nondistended, bowel sound present,   CNS: Alert, awake, oriented x 3 Psychiatry: Mood appropriate Extremities: No pedal edema, no calf tenderness, postsurgical status of left foot  Data Review: I have personally reviewed the laboratory data and studies available.  F/u labs  Unresulted Labs (From admission, onward)     Start     Ordered   02/09/24 0754  Aerobic/Anaerobic Culture w Gram Stain (surgical/deep wound)  RELEASE UPON ORDERING,   TIMED       Comments: Specimen A: Phone [phone] Immunocompromised?  No  Antibiotic Treatment:  Maxipime , Rocephin , Flagyl , Vancomycin  Is the patient on airborne/droplet precautions? No Clinical  History:   Foot/toe ulcer Special Instructions:  none Specimen Disposition:  Microbiology     02/09/24 0754            Signed, Chapman Rota, MD [facility] 02/09/2024

## 2024-02-09 NOTE — Anesthesia Procedure Notes (Signed)
 Procedure Name: LMA Insertion Date/Time: 02/09/2024 7:24 AM  Performed by: Lyle Delon LABOR, CRNAPre-anesthesia Checklist: Patient identified, Emergency Drugs available, Suction available and Patient being monitored Patient Re-evaluated:Patient Re-evaluated prior to induction Oxygen Delivery Method: Circle system utilized Preoxygenation: Pre-oxygenation with 100% oxygen Induction Type: IV induction Ventilation: Mask ventilation without difficulty LMA: LMA inserted LMA Size: 5.0 Tube type: Oral Number of attempts: 1 Airway Equipment and Method: Stylet and Oral airway Placement Confirmation: ETT inserted through vocal cords under direct vision, positive ETCO2 and breath sounds checked- equal and bilateral Tube secured with: Tape Dental Injury: Teeth and Oropharynx as per pre-operative assessment

## 2024-02-09 NOTE — Progress Notes (Signed)
 Physical Therapy Quick Note  PT has completed initial evaluation.    Overall, patient at supervision assistance level.   PT Follow up recommended: No follow up PT Equipment recommended:  None recommended. Pt is encouraged to utilize his cane to provide improved stability and to allow for offloading of LLE during mobility. Complete evaluation note to follow.     Bernardino JINNY Ruth, PT, DPT Acute Rehabilitation Office [phone]

## 2024-02-09 NOTE — Transfer of Care (Signed)
 Immediate Anesthesia Transfer of Care Note  Patient: Michael Hartman  Procedure(s) Performed: AMPUTATION, TOE (Left: Toe)  Patient Location: PACU  Anesthesia Type:General  Level of Consciousness: awake and alert   Airway & Oxygen Therapy: Patient Spontanous Breathing and Patient connected to face mask oxygen  Post-op Assessment: Report given to RN and Post -op Vital signs reviewed and stable  Post vital signs: Reviewed and stable  Last Vitals:  Vitals Value Taken Time  BP 161/87   Temp    Pulse 84 02/09/24 08:10  Resp 13 02/09/24 08:10  SpO2 93 % 02/09/24 08:10  Vitals shown include unfiled device data.  Last Pain:  Vitals:   02/09/24 0712  TempSrc: Oral  PainSc:          Complications: No notable events documented.

## 2024-02-09 NOTE — Consult Note (Addendum)
 Patient ID: Michael Hartman MRN: [id number] DOB/AGE: 1956/04/22 68 y.o.  Admit date: 02/07/2024  Admission Diagnoses:  Principal Problem:   Diabetic foot infection (HCC) Active Problems:   Pure hypercholesterolemia   OSA (obstructive sleep apnea)   Essential hypertension   CAD (coronary artery disease)   Type 2 diabetes mellitus with obesity (HCC)   Celiac disease   Gastro-esophageal reflux disease with esophagitis   MCI (mild cognitive impairment)   Persistent atrial fibrillation (HCC)   Psychosis (HCC)   HPI: Ortho consult for left 2nd toe traumatic non healing full thickness ulcer. He reports this began 2 wk ago when [employer] employee ran over his 2nd toe with an object.  PMH notable for cognitive impairment, DM-2, A-fib/flutter on Eliquis , CAD/remote stent, HTN, HLD, OSA not on CPAP, celiac disease and lower back pain.  Denies any new numbness/tingling.  Past Medical History: Past Medical History:  Diagnosis Date   Allergies    Arthritis    Atrial fibrillation    Atrial flutter    a. diagnosed in 11/2016 --> started on Eliquis  for anticoagulation b. s/p DCCV in 01/2017   CAD (coronary artery disease) 08/02/2007   Celiac disease    Chronic insomnia    Chronic rhinitis 08/02/2007   Coronary artery disease    a. s/p remote stenting of RCA in 1998   Diabetes mellitus type 2 03/23/2012   Erectile dysfunction    Essential hypertension 08/02/2007   Gastro-esophageal reflux disease with esophagitis 09/19/2020   History of migraine headaches    MCI (mild cognitive impairment) 09/19/2020   OSA (obstructive sleep apnea)    uses CPAP regularly   Pure hypercholesterolemia 08/02/2007   Secondary hypercoagulable state 07/26/2020   Sinusitis    Stage 2 chronic kidney disease     Surgical History: Past Surgical History:  Procedure Laterality Date   ANKLE ARTHROPLASTY     ATRIAL FIBRILLATION ABLATION N/A 10/24/2020   Procedure: ATRIAL FIBRILLATION ABLATION;   Surgeon: Inocencio Soyla Lunger, MD;  Location: [facility] INVASIVE CV LAB;  Service: Cardiovascular;  Laterality: N/A;   CARDIAC CATHETERIZATION  06/06/1997   EF 65%   CARDIOVASCULAR STRESS TEST  12/07/2007   EF 58%   CARDIOVERSION N/A 01/08/2017   Procedure: CARDIOVERSION;  Surgeon: Delford Maude BROCKS, MD;  Location: [facility] ENDOSCOPY;  Service: Cardiovascular;  Laterality: N/A;   CARDIOVERSION N/A 08/01/2020   Procedure: CARDIOVERSION;  Surgeon: Loni Soyla LABOR, MD;  Location: [facility] ENDOSCOPY;  Service: Cardiovascular;  Laterality: N/A;   CORONARY STENT PLACEMENT     ELBOW ARTHROPLASTY     EYE SURGERY     KNEE ARTHROPLASTY     low back surgery     NASAL SINUS SURGERY     REFRACTIVE SURGERY     SHOULDER ACROMIOPLASTY     SPINE SURGERY      Family History: Family History  Problem Relation Age of Onset   Hypertension Mother    Diabetes Mother    Alzheimer's disease Mother    Gallstones Mother    Ulcers Mother    Arthritis Mother    Stroke Father    Hypertension Father    Heart attack Father    Hypertension Brother     Social History: Social History   Socioeconomic History   Marital status: Married    Spouse name: Shawnee   Number of children: 0   Years of education: 16   Highest education level: Bachelor's degree (e.g., BA, AB, BS)  Occupational History  Occupation: Retired    Associate Professor: [employer]    Comment: DETENTION OFFICER  Tobacco Use   Smoking status: Former    Current packs/day: 0.00    Average packs/day: 4.0 packs/day for 20.0 years (80.0 ttl pk-yrs)    Types: Cigarettes    Start date: 06/09/1965    Quit date: 06/09/1985    Years since quitting: 38.6   Smokeless tobacco: Former  Substance and Sexual Activity   Alcohol use: No   Drug use: Not Currently   Sexual activity: Yes    Partners: Female    Birth control/protection: None  Other Topics Concern   Not on file  Social History Narrative   Lives with his wife and their cat.   Retired    Chief Executive Officer Drivers  of [employer] Strain: Not on [employer] Insecurity: No Food Insecurity (02/07/2024)   Hunger Vital Sign    Worried About Running Out of Food in the Last Year: Never true    Ran Out of Food in the Last Year: Never true  Transportation Needs: No Transportation Needs (02/07/2024)   PRAPARE - Administrator, Civil Service (Medical): No    Lack of Transportation (Non-Medical): No  Physical Activity: Not on file  Stress: Not on file  Social Connections: Not on file  Intimate Partner Violence: Not At Risk (02/07/2024)   Humiliation, Afraid, Rape, and Kick questionnaire    Fear of Current or Ex-Partner: No    Emotionally Abused: No    Physically Abused: No    Sexually Abused: No    Allergies: Ambien [zolpidem tartrate], Amoxicillin, and Penicillins  Medications: I have reviewed the patient's current medications.  Vital Signs: Patient Vitals for the past 24 hrs:  BP Temp Temp src Pulse Resp SpO2  02/09/24 0854 (!) 159/85 97.7 F (36.5 C) Oral (!) 59 16 94 %  02/09/24 0840 -- 98.8 F (37.1 C) -- 65 17 94 %  02/09/24 0830 (!) 159/85 -- -- 62 14 93 %  02/09/24 0815 (!) 147/81 98.8 F (37.1 C) -- 64 12 96 %  02/09/24 0810 (!) 161/87 -- -- 84 13 93 %  02/09/24 0712 (!) 151/88 97.9 F (36.6 C) Oral 61 17 95 %  02/09/24 0338 (!) 150/86 97.8 F (36.6 C) Oral (!) 59 19 94 %  02/09/24 0000 127/70 98.1 F (36.7 C) Oral (!) 59 -- 95 %  02/08/24 2000 136/75 97.9 F (36.6 C) Oral 66 (!) 21 98 %  02/08/24 1938 (!) 141/76 98.6 F (37 C) Oral 73 -- 98 %  02/08/24 1635 (!) 151/79 97.9 F (36.6 C) Oral 64 -- 92 %  02/08/24 1147 (!) 141/71 98.3 F (36.8 C) Oral 64 18 96 %    Radiology: DG MINI C-ARM IMAGE ONLY Result Date: 02/09/2024 There is no interpretation for this exam.  This order is for images obtained during a surgical procedure.  Please See Surgeries Tab for more information regarding the procedure.   MRI Left foot without contrast Result Date:  02/08/2024 CLINICAL DATA:  Soft tissue infection of the second toe. EXAM: MRI OF THE LEFT FOOT WITHOUT CONTRAST TECHNIQUE: Multiplanar, multisequence MR imaging of the left foot was performed. No intravenous contrast was administered. COMPARISON:  Foot x-ray 02/07/2024 FINDINGS: Bones/Joint/Cartilage No fracture or dislocation. Normal alignment. No joint effusion. Soft tissue edema of the second toe likely reflecting mild cellulitis. Mild soft marrow edema in the second distal phalanx and plantar base of the second  middle phalanx which may be reactive versus secondary to early osteomyelitis. Mild osteoarthritis of the second, third and fourth TMT joints. Ligaments Collateral ligaments are intact.  Lisfranc ligament is intact. Muscles and Tendons Flexor, peroneal and extensor compartment tendons are intact. T2 hyperintense signal throughout the plantar musculature likely neurogenic with mild generalized muscle atrophy. Soft tissue No fluid collection or hematoma.  No soft tissue mass. IMPRESSION: 1. Soft tissue edema of the second toe likely reflecting mild cellulitis. Mild soft marrow edema in the second distal phalanx and plantar base of the second middle phalanx which may be reactive versus secondary to early osteomyelitis. No bone destruction or periosteal reaction. 2. Mild osteoarthritis of the second, third and fourth TMT joints. Electronically Signed   By: Julaine Blanch M.D.   On: 02/08/2024 08:14   DG Foot Complete Left Result Date: 02/07/2024 CLINICAL DATA:  [id number] Osteomyelitis [facility]) [id number] Infection [id number] Ulcerated, foot (HCC) [id number] EXAM: DG FOOT COMPLETE 3+V*L* COMPARISON:  X-ray left foot 08/21/2014 FINDINGS: Limited evaluation due to overlapping osseous structures and overlying soft tissues. No acute cortical erosion or destruction. There is no evidence of fracture or dislocation. There is no evidence of arthropathy or other focal bone abnormality. Lateral forefoot subcutaneus soft tissue  edema. IMPRESSION: No acute displaced fracture or dislocation. No radiographic findings to suggest osteomyelitis. Limited evaluation due to overlapping osseous structures and overlying soft tissues. Electronically Signed   By: Morgane  Naveau M.D.   On: 02/07/2024 14:58    Labs: Recent Labs    02/07/24 1338 02/08/24 0658  WBC 4.0 4.1  RBC 4.95 4.64  HCT 43.1 40.8  PLT 308 256   Recent Labs    02/07/24 1338 02/08/24 0658  NA 134* 140  K 3.9 4.4  CL 95* 101  CO2 22 25  BUN 12 15  CREATININE 1.23 1.25*  GLUCOSE 233* 188*  CALCIUM  9.5 9.2   No results for input(s): LABPT, INR in the last 72 hours.  Review of Systems: ROS as detailed in HPI  Physical Exam: Body mass index is 30.81 kg/m.  Physical Exam   Gen: AAOx3, NAD Comfortable at rest  Left Lower Extremity: Skin intact except full thickness ulcer 2nd toe TTP over 2nd toe ADF/APF/EHL 5/5 SILT throughout DP, PT 2+ to palp CR < 2s   Assessment and Plan: Ortho consult for left 2nd toe diabetic foot full thickness ulceration with osteomyelitis, onset mid Aug 2025  -history, exam and imaging reviewed at length with patient -plan today for left foot 2nd toe amputation -NPO and held VTE ppx since MN -PT/OT postop -on IV Vanc on RNF  Lillia Mountain, MD Orthopaedic Surgeon [facility] [phone]  The risks and benefits were presented and reviewed. The risks due to hardware failure/irritation, new/persistent/recurrent infection, stiffness, nerve/vessel/tendon injury, nonunion/malunion of any fracture, wound healing issues, allograft usage, development of arthritis, failure of this surgery, possibility of external fixation in certain situations, possibility of delayed definitive surgery, need for further surgery, prolonged wound care including further soft tissue coverage procedures, thromboembolic events, anesthesia/medical complications/events perioperatively and beyond, amputation, death among others  were discussed. The patient acknowledged the explanation, agreed to proceed with the plan and a consent was signed.

## 2024-02-09 NOTE — Op Note (Signed)
 02/09/2024  10:08 AM   PATIENT: Michael Hartman  68 y.o. male  MRN: [id number]   PRE-OPERATIVE DIAGNOSIS:   Left 2nd toe diabetic foot full thickness ulceration with osteomyelitis, onset mid Aug 2025    POST-OPERATIVE DIAGNOSIS:   Same   PROCEDURE: Left 2nd toe amputation (MTP disarticulation)   SURGEON:  Lillia Mountain, MD   ASSISTANT: None   ANESTHESIA: General   EBL: 2 cc   TOURNIQUET:   None used   COMPLICATIONS: None apparent   DISPOSITION: Extubated, awake and stable to recovery.   INDICATION FOR PROCEDURE: The patient presented with above diagnosis.  We discussed the diagnosis, alternative treatment options, risks and benefits of the above surgical intervention, as well as alternative non-operative treatments. All questions/concerns were addressed and the patient/family demonstrated appropriate understanding of the diagnosis, the procedure, the postoperative course, and overall prognosis. The patient wished to proceed with surgical intervention and signed an informed surgical consent as such, in each others presence prior to surgery.   PROCEDURE IN DETAIL: After preoperative consent was obtained and the correct operative site was identified, the patient was brought to the operating room supine on stretcher. General anesthesia was induced. Preoperative antibiotics were administered. Surgical timeout was taken. The patient was then positioned supine with an ipsilateral hip bump. The operative lower extremity was prepped and draped in standard sterile fashion.  Level of amputation was determined by soft tissue status to be the 2nd MTP joint as confirmed by intraoperative examination. A standard racket incision was made in healthy skin area at the level of the 2nd metatarsophalangeal joint. Dissection was carried down sharply to the proximal phalanx. The surrounding tendons and other soft tissues were carefully transected and the collateral ligaments of the 2nd  MTP joint released. The infected 2nd toe was thus excised and sent as specimen for bone biopsy/intraoperative cultures.  We then irrigated the surgical site thoroughly with 3 L of normal saline using cysto tubing. Healthy bleeding noted throughout the amputation stump site. There were no signs of infection remaining in the surgical site following amputation. Hemostasis was carefully obtained. Mild steady bleeding observed and noted to be appropriate for surgical site. Irrisept and Betadine solutions were instilled. Vancomycin  powder was placed in the amputation site.    The skin was closed with zero tension using 2-0 PDS suture. The incision was noted to be dry upon closure. Overall, the operative foot was noted to be significantly less erythematous with decreased swelling.   The leg was cleaned with saline and sterile adaptic dressings were applied. A well padded sterile dressing was applied. The patient was awakened from anesthesia and transported to the recovery room in stable condition.    FOLLOW UP PLAN: -transfer to PACU, then return to RNF -strict heel WB in forefoot offloading postop shoe operative extremity, maximum elevation -bedside nursing to do daily dressing changes starting POD1 -DVT ppx and pain rx per primary team (homegoing pain rx sent via outside EMR) -follow up as outpatient within 7-10 days for wound check -sutures out in 2-3 weeks in outpatient office   RADIOGRAPHS: None today   Lillia Mountain Orthopaedic Surgery [facility]

## 2024-02-09 NOTE — Anesthesia Postprocedure Evaluation (Signed)
 Anesthesia Post Note  Patient: Michael Hartman  Procedure(s) Performed: AMPUTATION, TOE (Left: Toe)     Patient location during evaluation: PACU Anesthesia Type: General Level of consciousness: awake and alert Pain management: pain level controlled Vital Signs Assessment: post-procedure vital signs reviewed and stable Respiratory status: spontaneous breathing, nonlabored ventilation and respiratory function stable Cardiovascular status: blood pressure returned to baseline and stable Postop Assessment: no apparent nausea or vomiting Anesthetic complications: no   No notable events documented.  Last Vitals:  Vitals:   02/09/24 0840 02/09/24 0854  BP:  (!) 159/85  Pulse: 65 (!) 59  Resp: 17 16  Temp: 37.1 C 36.5 C  SpO2: 94% 94%    Last Pain:  Vitals:   02/09/24 1016  TempSrc:   PainSc: 5                  Garnette FORBES Skillern

## 2024-02-09 NOTE — Progress Notes (Signed)
 Michael Hartman to be discharged home per MD order. Discharge nurse Rosario discussed with the patient and all questions fully answered.  Skin clean, dry, and intact without evidence of skin break down. Dressing to left foot intact with small amount of blood after walking with PT. Dr. Barton aware. Extra dressing supplies sent with family. IV catheter discontinued intact. Site without signs and symptoms of complications. Dressing and pressure applied.  An After Visit Summary was printed and given to the patient.  Patient escorted via [facility], and discharged home via private auto.  Michael Hartman  02/09/2024 6:20 PM

## 2024-02-09 NOTE — Discharge Instructions (Signed)
 Lillia Mountain, MD [facility]  Please read the following information regarding your care after surgery.  Medications  You only need a prescription for the narcotic pain medicine (ex. oxycodone , Percocet, Norco).  All of the other medicines listed below are available over the counter. ? acetaminophen  (Tylenol ) 500 mg every 4-6 hours as you need for minor to moderate pain  ? To help prevent blood clots, take aspirin (81 mg) twice daily for 28 days after surgery.  You should also get up every hour while you are awake to move around.  Weight Bearing ? Once your nerve block is completely worn off, OK to HEEL weightbear in postop shoe on the operated leg or foot. This means do NOT touch the front of your surgical leg to the ground!  Cast / Splint / Dressing ? If you have a dressing, do NOT remove this. Keep your splint, cast or dressing clean and dry.  Don't put anything (coat hanger, pencil, etc) down inside of it.  If it gets wet, call the office immediately to schedule an appointment for a cast change.  Swelling IMPORTANT: It is normal for you to have swelling where you had surgery. To reduce swelling and pain, keep at least 3 pillows under your leg so that your toes are above your nose and your heel is above the level of your hip.  It may be necessary to keep your foot or leg elevated for several weeks.  This is critical to helping your incisions heal and your pain to feel better.  Follow Up Call my office at [phone] when you are discharged from the hospital or surgery center to schedule an appointment to be seen 7-10 days after surgery.  Call my office at [phone] if you develop a fever >101.5 F, nausea, vomiting, bleeding from the surgical site or severe pain.

## 2024-02-10 ENCOUNTER — Encounter (HOSPITAL_COMMUNITY): Payer: Self-pay | Admitting: Orthopaedic Surgery

## 2024-02-11 LAB — SURGICAL PATHOLOGY

## 2024-02-12 LAB — CULTURE, BLOOD (ROUTINE X 2)
Culture: NO GROWTH
Culture: NO GROWTH

## 2024-02-14 LAB — AEROBIC/ANAEROBIC CULTURE W GRAM STAIN (SURGICAL/DEEP WOUND): Gram Stain: NONE SEEN

## 2024-03-01 ENCOUNTER — Telehealth: Payer: Self-pay | Admitting: Infectious Disease

## 2024-03-01 NOTE — Telephone Encounter (Signed)
 Donyale's wife, Shawnee, called to schedule a New Patient appt for her husband. She wanted to inform the provider the Zachari has a touch of dementia and she will attend the appt.

## 2024-03-01 NOTE — Telephone Encounter (Signed)
Thanks Kim.
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
## 2024-03-14 ENCOUNTER — Ambulatory Visit (INDEPENDENT_AMBULATORY_CARE_PROVIDER_SITE_OTHER): Admitting: Infectious Disease

## 2024-03-14 ENCOUNTER — Other Ambulatory Visit: Payer: Self-pay

## 2024-03-14 ENCOUNTER — Encounter: Payer: Self-pay | Admitting: Infectious Disease

## 2024-03-14 VITALS — Wt 248.0 lb

## 2024-03-14 DIAGNOSIS — Z880 Allergy status to penicillin: Secondary | ICD-10-CM | POA: Diagnosis not present

## 2024-03-14 DIAGNOSIS — A491 Streptococcal infection, unspecified site: Secondary | ICD-10-CM | POA: Diagnosis not present

## 2024-03-14 DIAGNOSIS — E11628 Type 2 diabetes mellitus with other skin complications: Secondary | ICD-10-CM

## 2024-03-14 DIAGNOSIS — L089 Local infection of the skin and subcutaneous tissue, unspecified: Secondary | ICD-10-CM | POA: Diagnosis not present

## 2024-03-14 DIAGNOSIS — M869 Osteomyelitis, unspecified: Secondary | ICD-10-CM | POA: Insufficient documentation

## 2024-03-14 DIAGNOSIS — M86272 Subacute osteomyelitis, left ankle and foot: Secondary | ICD-10-CM | POA: Diagnosis not present

## 2024-03-14 MED ORDER — CEFADROXIL 500 MG PO CAPS: 1000.0000 mg | ORAL_CAPSULE | Freq: Two times a day (BID) | ORAL | 1 refills | Status: DC

## 2024-03-15 LAB — CBC WITH DIFFERENTIAL/PLATELET
Absolute Lymphocytes: 1197 {cells}/uL (ref 850–3900)
Absolute Monocytes: 370 {cells}/uL (ref 200–950)
Basophils Absolute: 48 {cells}/uL (ref 0–200)
Basophils Relative: 1.1 %
Eosinophils Absolute: 88 {cells}/uL (ref 15–500)
Eosinophils Relative: 2 %
HCT: 44.5 % (ref 38.5–50.0)
Hemoglobin: 14.9 g/dL (ref 13.2–17.1)
MCH: 29.6 pg (ref 27.0–33.0)
MCHC: 33.5 g/dL (ref 32.0–36.0)
MCV: 88.5 fL (ref 80.0–100.0)
MPV: 10.6 fL (ref 7.5–12.5)
Monocytes Relative: 8.4 %
Neutro Abs: 2697 {cells}/uL (ref 1500–7800)
Neutrophils Relative %: 61.3 %
Platelets: 281 Thousand/uL (ref 140–400)
RBC: 5.03 Million/uL (ref 4.20–5.80)
RDW: 12.8 % (ref 11.0–15.0)
Total Lymphocyte: 27.2 %
WBC: 4.4 Thousand/uL (ref 3.8–10.8)

## 2024-03-15 LAB — BASIC METABOLIC PANEL WITHOUT GFR
BUN: 11 mg/dL (ref 7–25)
CO2: 24 mmol/L (ref 20–32)
Calcium: 10 mg/dL (ref 8.6–10.3)
Chloride: 98 mmol/L (ref 98–110)
Creat: 1.1 mg/dL (ref 0.70–1.28)
Glucose, Bld: 341 mg/dL — ABNORMAL HIGH (ref 65–99)
Potassium: 4.5 mmol/L (ref 3.5–5.3)
Sodium: 134 mmol/L — ABNORMAL LOW (ref 135–146)

## 2024-03-15 LAB — SEDIMENTATION RATE: Sed Rate: 6 mm/h (ref 0–20)

## 2024-03-15 LAB — C-REACTIVE PROTEIN: CRP: <3 mg/L (ref <8.0)

## 2024-03-16 ENCOUNTER — Other Ambulatory Visit: Payer: Self-pay | Admitting: Neurology

## 2024-03-16 DIAGNOSIS — F0283 Dementia in other diseases classified elsewhere, unspecified severity, with mood disturbance: Secondary | ICD-10-CM

## 2024-03-18 ENCOUNTER — Encounter: Payer: Self-pay | Admitting: Family Medicine

## 2024-04-12 ENCOUNTER — Ambulatory Visit
Admission: RE | Admit: 2024-04-12 | Discharge: 2024-04-12 | Disposition: A | Discharge status: Home / Self Care | Source: Ambulatory Visit | Attending: Infectious Disease | Admitting: Infectious Disease

## 2024-04-12 ENCOUNTER — Ambulatory Visit (INDEPENDENT_AMBULATORY_CARE_PROVIDER_SITE_OTHER): Payer: Self-pay | Admitting: Infectious Disease

## 2024-04-12 ENCOUNTER — Other Ambulatory Visit: Payer: Self-pay

## 2024-04-12 ENCOUNTER — Encounter: Payer: Self-pay | Admitting: Infectious Disease

## 2024-04-12 VITALS — BP 142/95 | HR 107 | Wt 241.0 lb

## 2024-04-12 DIAGNOSIS — I998 Other disorder of circulatory system: Secondary | ICD-10-CM

## 2024-04-12 DIAGNOSIS — I739 Peripheral vascular disease, unspecified: Secondary | ICD-10-CM

## 2024-04-12 DIAGNOSIS — Z880 Allergy status to penicillin: Secondary | ICD-10-CM

## 2024-04-12 DIAGNOSIS — A491 Streptococcal infection, unspecified site: Secondary | ICD-10-CM | POA: Diagnosis not present

## 2024-04-12 DIAGNOSIS — I2510 Atherosclerotic heart disease of native coronary artery without angina pectoris: Secondary | ICD-10-CM | POA: Diagnosis not present

## 2024-04-12 DIAGNOSIS — M86272 Subacute osteomyelitis, left ankle and foot: Secondary | ICD-10-CM | POA: Diagnosis not present

## 2024-04-12 DIAGNOSIS — E11628 Type 2 diabetes mellitus with other skin complications: Secondary | ICD-10-CM

## 2024-04-12 DIAGNOSIS — G3184 Mild cognitive impairment, so stated: Secondary | ICD-10-CM

## 2024-04-12 DIAGNOSIS — L089 Local infection of the skin and subcutaneous tissue, unspecified: Secondary | ICD-10-CM

## 2024-04-12 NOTE — Progress Notes (Signed)
 Subjective:  Chief Complaint: followup for osteomyelitis of 2nd toe, with concerns re the 3rd toe  Patient ID: Michael Hartman, male    DOB: 1955-10-02, 68 y.o.   MRN: [id number]  HPI  Discussed the use of AI scribe software for clinical note transcription with the patient, who gave verbal consent to proceed.  History of Present Illness   Hartman Michael is a 68 year old male who presents with persistent discoloration and swelling of the third toe following a foot injury and subsequent 2nd toe amputation.  He has persistent discoloration and swelling of the toe adjacent to the amputation site on his left foot. This condition has been present since the initial injury at the end of August 2025, when someone stepped on his foot. The toe was amputated in early September 2025 by an orthopedic surgeon at [facility].  He had been on doxycyline when I saw him but which I switched to cefadroxil, two tablets twice daily for four weeks, during his last visit on March 14, 2024.  He is scheduled to follow up with the surgeon on May 17, 2024. He mentions that the appearance of the 3rd  toe has not worsened but has persisted, and he describes the nail as looking 'dirty' and 'kind of like blood'. He is concerned about the appearance of the toe, stating it 'looks nasty' to him. He also cannot bend this toe and is bothered by the swelling.       Past Medical History:  Diagnosis Date   Allergies    Arthritis    Atrial fibrillation    Atrial flutter    a. diagnosed in 11/2016 --> started on Eliquis  for anticoagulation b. s/p DCCV in 01/2017   CAD (coronary artery disease) 08/02/2007   Celiac disease    Chronic insomnia    Chronic rhinitis 08/02/2007   Coronary artery disease    a. s/p remote stenting of RCA in 1998   Diabetes mellitus type 2 03/23/2012   Erectile dysfunction    Essential hypertension 08/02/2007   Gastro-esophageal reflux disease with esophagitis 09/19/2020   Group B  streptococcal infection 03/14/2024   History of migraine headaches    MCI (mild cognitive impairment) 09/19/2020   OSA (obstructive sleep apnea)    uses CPAP regularly   Osteomyelitis of left foot (HCC) 03/14/2024   Penicillin allergy 03/14/2024   Pure hypercholesterolemia 08/02/2007   Secondary hypercoagulable state 07/26/2020   Sinusitis    Stage 2 chronic kidney disease     Past Surgical History:  Procedure Laterality Date   AMPUTATION TOE Left 02/09/2024   Procedure: AMPUTATION, TOE;  Surgeon: Barton Drape, MD;  Location: [facility] OR;  Service: Orthopedics;  Laterality: Left;  2ND TOE AMPUTATION   ANKLE ARTHROPLASTY     ATRIAL FIBRILLATION ABLATION N/A 10/24/2020   Procedure: ATRIAL FIBRILLATION ABLATION;  Surgeon: Inocencio Soyla Lunger, MD;  Location: [facility] INVASIVE CV LAB;  Service: Cardiovascular;  Laterality: N/A;   CARDIAC CATHETERIZATION  06/06/1997   EF 65%   CARDIOVASCULAR STRESS TEST  12/07/2007   EF 58%   CARDIOVERSION N/A 01/08/2017   Procedure: CARDIOVERSION;  Surgeon: Delford Maude BROCKS, MD;  Location: [facility] ENDOSCOPY;  Service: Cardiovascular;  Laterality: N/A;   CARDIOVERSION N/A 08/01/2020   Procedure: CARDIOVERSION;  Surgeon: Loni Soyla LABOR, MD;  Location: [facility] ENDOSCOPY;  Service: Cardiovascular;  Laterality: N/A;   CORONARY STENT PLACEMENT     ELBOW ARTHROPLASTY     EYE SURGERY     KNEE  ARTHROPLASTY     low back surgery     NASAL SINUS SURGERY     REFRACTIVE SURGERY     SHOULDER ACROMIOPLASTY     SPINE SURGERY      Family History  Problem Relation Age of Onset   Hypertension Mother    Diabetes Mother    Alzheimer's disease Mother    Gallstones Mother    Ulcers Mother    Arthritis Mother    Stroke Father    Hypertension Father    Heart attack Father    Hypertension Brother       Social History   Socioeconomic History   Marital status: Married    Spouse name: Shawnee   Number of children: 0   Years of education: 16   Highest education level:  Bachelor's degree (e.g., BA, AB, BS)  Occupational History   Occupation: Retired    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   Retired    Chief Executive Officer Drivers of Corporate Investment Banker Strain: Not on [employer] Insecurity: No Food Insecurity (02/07/2024)   Hunger Vital Sign    Worried About Running Out of Food in the Last Year: Never true    Ran Out of Food in the Last Year: Never true  Transportation Needs: No Transportation Needs (02/07/2024)   PRAPARE - Administrator, Civil Service (Medical): No    Lack of Transportation (Non-Medical): No  Physical Activity: Not on file  Stress: Not on file  Social Connections: Not on file    Allergies  Allergen Reactions   Ambien [Zolpidem Tartrate]     Amnesia, odd behavior   Amoxicillin Rash   Penicillins Rash    Child hood     Current Outpatient Medications:    acetaminophen  (TYLENOL ) 500 MG tablet, Take 1,000 mg by mouth every 6 (six) hours as needed for mild pain or headache., Disp: , Rfl:    atorvastatin  (LIPITOR) 80 MG tablet, Take 1 tablet (80 mg total) by mouth daily., Disp: 90 tablet, Rfl: 3   cefadroxil (DURICEF) 500 MG capsule, Take 2 capsules (1,000 mg total) by mouth 2 (two) times daily., Disp: 120 capsule, Rfl: 1   diltiazem  (CARDIZEM  CD) 180 MG 24 hr capsule, Take 1 capsule (180 mg total) by mouth daily., Disp: 90 capsule, Rfl: 3   donepezil  (ARICEPT ) 10 MG tablet, TAKE 1  TABLET (10 MG TOTAL) BY  MOUTH AT BEDTIME. FOLLOW  INSTRUCTIONS PROVIDED FOR THE  FIRST MONTH OF TREATMENT., Disp: 90 tablet, Rfl: 3   ELIQUIS  5 MG TABS tablet, TAKE 1 TABLET BY MOUTH TWICE  DAILY, Disp: 180 tablet, Rfl: 3   escitalopram (LEXAPRO) 10 MG tablet, Take 10 mg by mouth daily., Disp: , Rfl:    glipiZIDE (GLUCOTROL XL) 10 MG 24 hr tablet, Take 10 mg by mouth  daily., Disp: , Rfl:    JARDIANCE 25 MG TABS tablet, Take 25 mg by mouth daily., Disp: , Rfl:    metFORMIN  (GLUCOPHAGE ) 1000 MG tablet, Take 1,000 mg by mouth 2 (two) times daily., Disp: , Rfl:    methocarbamol (ROBAXIN) 500 MG tablet, Take 500 mg by mouth 2 (two) times daily as needed for muscle spasms., Disp: , Rfl:    metoprolol  succinate (TOPROL -XL) 100 MG 24 hr tablet, TAKE 1 TABLET BY MOUTH DAILY, Disp: 90 tablet, Rfl: 3   mirtazapine  (REMERON ) 45 MG tablet, Take 45 mg by mouth at bedtime., Disp: , Rfl:    nitroGLYCERIN  (NITROSTAT ) 0.4 MG SL tablet, Place 1 tablet (0.4 mg total) under the tongue every 5 (five) minutes as needed for chest pain., Disp: 25 tablet, Rfl: 11   pioglitazone (ACTOS) 30 MG tablet, Take 30 mg by mouth daily., Disp: , Rfl:    Review of Systems  Unable to perform ROS: Dementia       Objective:   Physical Exam Constitutional:      Appearance: He is well-developed.  HENT:     Head: Normocephalic and atraumatic.  Eyes:     Conjunctiva/sclera: Conjunctivae normal.  Cardiovascular:     Rate and Rhythm: Normal rate and regular rhythm.  Pulmonary:     Effort: Pulmonary effort is normal. No respiratory distress.     Breath sounds: No wheezing.  Abdominal:     General: There is no distension.     Palpations: Abdomen is soft.  Musculoskeletal:        General: No tenderness. Normal range of motion.     Cervical back: Normal range of motion and neck supple.  Skin:    General: Skin is warm and dry.     Coloration: Skin is not pale.     Findings: No erythema or rash.  Neurological:     General: No  focal deficit present.     Mental Status: He is alert and oriented to person, place, and time.  Psychiatric:        Mood and Affect: Mood normal.        Behavior: Behavior normal.        Thought Content: Thought content normal.        Judgment: Judgment normal.     Left foot 03/2024         04/12/2024:            Assessment & Plan:   Assessment and Plan    3rd toe discoloration and swelling Persistent discoloration and swelling of the left foot, particularly around the nail of the toe adjacent to the amputated one. Differential diagnosis includes trauma-related changes versus possible osteomyelitis. - Order x-ray of the left foot to evaluate for possible osteomyelitis or other abnormalities --check ESR, CRP< CBC Bmp. - Consider MRI if x-ray is unrevealing and symptoms persist. - Follow-up with Dr. Barton on December 9th for further evaluation.  Group B streptococcal infection and osteomyelitis of 2nd toe: Previous culture showed rare group B streptococci. Current antibiotic regimen with cefadroxil is to be continued due to mild allergy to amoxicillin and penicillin. --this site appears to be cured      NOTE BY LINING UP MY PICTURES THE 3RD TOE IS MUCH MORE WORRISOME SINCE IT IS DEFINITELY WORSE COMPARED TO BEFORE. IT HAS AN ISCHEMIC looking component.  After he has Xray I will order an MRI but his orthopedic surgeon  and [facility]

## 2024-04-13 ENCOUNTER — Ambulatory Visit: Payer: Self-pay

## 2024-04-13 LAB — CBC WITH DIFFERENTIAL/PLATELET
Absolute Lymphocytes: 1349 {cells}/uL (ref 850–3900)
Absolute Monocytes: 381 {cells}/uL (ref 200–950)
Basophils Absolute: 49 {cells}/uL (ref 0–200)
Basophils Relative: 1.2 %
Eosinophils Absolute: 98 {cells}/uL (ref 15–500)
Eosinophils Relative: 2.4 %
HCT: 48.1 % (ref 38.5–50.0)
Hemoglobin: 15.4 g/dL (ref 13.2–17.1)
MCH: 28.8 pg (ref 27.0–33.0)
MCHC: 32 g/dL (ref 32.0–36.0)
MCV: 89.9 fL (ref 80.0–100.0)
MPV: 10.1 fL (ref 7.5–12.5)
Monocytes Relative: 9.3 %
Neutro Abs: 2222 {cells}/uL (ref 1500–7800)
Neutrophils Relative %: 54.2 %
Platelets: 312 Thousand/uL (ref 140–400)
RBC: 5.35 Million/uL (ref 4.20–5.80)
RDW: 12.9 % (ref 11.0–15.0)
Total Lymphocyte: 32.9 %
WBC: 4.1 Thousand/uL (ref 3.8–10.8)

## 2024-04-13 LAB — BASIC METABOLIC PANEL WITHOUT GFR
BUN/Creatinine Ratio: 13 (calc) (ref 6–22)
BUN: 19 mg/dL (ref 7–25)
CO2: 24 mmol/L (ref 20–32)
Calcium: 9.7 mg/dL (ref 8.6–10.3)
Chloride: 98 mmol/L (ref 98–110)
Creat: 1.41 mg/dL — ABNORMAL HIGH (ref 0.70–1.28)
Glucose, Bld: 470 mg/dL — ABNORMAL HIGH (ref 65–99)
Potassium: 4.2 mmol/L (ref 3.5–5.3)
Sodium: 135 mmol/L (ref 135–146)

## 2024-04-13 LAB — SEDIMENTATION RATE: Sed Rate: 14 mm/h (ref 0–20)

## 2024-04-13 LAB — C-REACTIVE PROTEIN: CRP: 4.1 mg/L (ref <8.0)

## 2024-04-13 NOTE — Telephone Encounter (Signed)
-----   Message from [place] sent at 04/13/2024 10:36 AM EST ----- Blood sugar almost 400 needs to see PCP or go to an ER ----- Message ----- From: Rebecka Hose Lab Results In Sent: 04/12/2024  10:56 PM EST To: Jomarie LOISE Fleeta Kathie, MD

## 2024-04-13 NOTE — Patient Instructions (Addendum)
 Below is our plan:  We will continue donepezil  10mg  daily. I would recommend Mrs Winslow assist with medication administration if he will allow but have advised she not engage in argumentative behaviors. I have discussed ordering an ATN profile to look for causes of mental confusion as well as an MRI. He agrees to both. I have discussed safety considerations with Mrs Clauson. I have encouraged her to speak with her family and consider reaching out to [facility]ent for further legal guidance on advanced directives. I have advised that Mr Laredo not drive alone.   Please make sure you are staying well hydrated. I recommend 50-60 ounces daily. Well balanced diet and regular exercise encouraged. Consistent sleep schedule with 6-8 hours recommended.   Please continue follow up with care team as directed.   Follow up with Dr Buck in 3-4 months   You may receive a survey regarding today's visit. I encourage you to leave honest feed back as I do use this information to improve patient care. Thank you for seeing me today!   Management of Memory Problems   There are some general things you can do to help manage your memory problems.  Your memory may not in fact recover, but by using techniques and strategies you will be able to manage your memory difficulties better.   1)  Establish a routine. Try to establish and then stick to a regular routine.  By doing this, you will get used to what to expect and you will reduce the need to rely on your memory.  Also, try to do things at the same time of day, such as taking your medication or checking your calendar first thing in the morning. Think about think that you can do as a part of a regular routine and make a list.  Then enter them into a daily planner to remind you.  This will help you establish a routine.   2)  Organize your environment. Organize your environment so that it is uncluttered.  Decrease visual stimulation.  Place everyday items such as keys or cell  phone in the same place every day (ie.  Basket next to front door) Use post it notes with a brief message to yourself (ie. Turn off light, lock the door) Use labels to indicate where things go (ie. Which cupboards are for food, dishes, etc.) Keep a notepad and pen by the telephone to take messages   3)  Memory Aids A diary or journal/notebook/daily planner Making a list (shopping list, chore list, to do list that needs to be done) Using an alarm as a reminder (kitchen timer or cell phone alarm) Using cell phone to store information (Notes, Calendar, Reminders) Calendar/White board placed in a prominent position Post-it notes   In order for memory aids to be useful, you need to have good habits.  It's no good remembering to make a note in your journal if you don't remember to look in it.  Try setting aside a certain time of day to look in journal.   4)  Improving mood and managing fatigue. There may be other factors that contribute to memory difficulties.  Factors, such as anxiety, depression and tiredness can affect memory. Regular gentle exercise can help improve your mood and give you more energy. Exercise: there are short videos created by the [employer] on Health specially for older adults: https://bit.ly/2I30q97.  Mediterranean diet: which emphasizes fruits, vegetables, whole grains, legumes, fish, and other seafood; unsaturated fats such as olive oils;  and low amounts of red meat, eggs, and sweets. A variation of this, called MIND (Mediterranean-DASH Intervention for Neurodegenerative Delay) incorporates the DASH (Dietary Approaches to Stop Hypertension) diet, which has been shown to lower high blood pressure, a risk factor for Alzheimer's disease. More information at: exitmarketing.de.  Aerobic exercise that improve heart health is also good for the mind.  [employer] on Aging have short videos for  exercises that you can do at home: Blindworkshop.com.pt Simple relaxation techniques may help relieve symptoms of anxiety Try to get back to completing activities or hobbies you enjoyed doing in the past. Learn to pace yourself through activities to decrease fatigue. Find out about some local support groups where you can share experiences with others. Try and achieve 7-8 hours of sleep at night.   Resources for Family/Caregiver  Online caregiver support groups can be found at westerntunes.it or call Alzheimer's Association's 24/7 hotline: 331-337-2007. [facility] Program offers in-person, virtual support groups and individual counseling for both care partners and persons with memory loss. Call for more information at [phone].   Advanced care plan: there are two types of Power of Attorney: healthcare and durable. Healthcare POA is a designated person to make healthcare decisions on your behalf if you were too sick to make them yourself. This person can be selected and documented by your physician. Durable POA has to be set up with a lawyer who takes charge of your finances and estate if you were too sick or cognitively impaired to manage your finances accurately. You can find a local Elder Therapist, art here: newportranch.at.  Check out www.planyourlifespan.org, which will help you plan before a crisis and decide who will take care of life considerations in a circumstance where you may not be able to speak for yourself.   Helpful books (available on [employer] or your local bookstore):  By Dr. Katherene Gentry: Keeping Love Alive as Memories Fade: The 5 Love Languages and the Alzheimer's Journey Mar 10, 2015 The Dementia Care Partner's Workbook: A Guide for Understanding, Education, and [employer] - November 07, 2017.  Both available for less than $15.   Coping with behavior change in dementia: a family caregiver's guide by Landry Mirza & Mitzie Pizza A Caregiver's Guide to Dementia:  Using Activities and Other Strategies to Prevent, Reduce and Manage Behavioral Symptoms by Leita SAILOR. [place] and Catherine Piersol.  Creating Moments of Joy for the Person with Alzheimer's or Dementia 4th edition by Melanie Mings  Caregiver videos on common behaviors related to dementia: populationgame.pl  [place] Caregiver Portal: free to sign up, links to local resources: [email or web address]

## 2024-04-13 NOTE — Telephone Encounter (Signed)
 Patient made aware of blood glucose level elevated >400. Explained risks of consistent blood glucose and the urgent need to follow up with endocrinology or PCP or ED. Patient verbalized under standing and verbalized that he will reach out to Endocrinology and follow up with them regarding elevated blood glucose levels.   Enis Kleine, LPN
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
## 2024-04-15 ENCOUNTER — Other Ambulatory Visit: Payer: Self-pay

## 2024-04-15 ENCOUNTER — Encounter: Payer: Self-pay | Admitting: Family Medicine

## 2024-04-15 DIAGNOSIS — L97509 Non-pressure chronic ulcer of other part of unspecified foot with unspecified severity: Secondary | ICD-10-CM

## 2024-04-18 ENCOUNTER — Encounter: Payer: Self-pay | Admitting: Family Medicine

## 2024-04-18 ENCOUNTER — Ambulatory Visit (INDEPENDENT_AMBULATORY_CARE_PROVIDER_SITE_OTHER): Admitting: Family Medicine

## 2024-04-18 VITALS — BP 140/90 | HR 65 | Ht 73.0 in | Wt 245.5 lb

## 2024-04-18 DIAGNOSIS — G4733 Obstructive sleep apnea (adult) (pediatric): Secondary | ICD-10-CM

## 2024-04-18 DIAGNOSIS — R4189 Other symptoms and signs involving cognitive functions and awareness: Secondary | ICD-10-CM

## 2024-04-18 DIAGNOSIS — F0283 Dementia in other diseases classified elsewhere, unspecified severity, with mood disturbance: Secondary | ICD-10-CM

## 2024-04-18 DIAGNOSIS — F22 Delusional disorders: Secondary | ICD-10-CM | POA: Diagnosis not present

## 2024-04-18 DIAGNOSIS — G3184 Mild cognitive impairment, so stated: Secondary | ICD-10-CM

## 2024-04-19 ENCOUNTER — Telehealth: Payer: Self-pay | Admitting: *Deleted

## 2024-04-19 ENCOUNTER — Telehealth: Payer: Self-pay | Admitting: Family Medicine

## 2024-04-19 NOTE — Telephone Encounter (Signed)
 no auth required sent to GI [phone]

## 2024-04-19 NOTE — Telephone Encounter (Signed)
 Call to [facility], staff at PCP, and informed of blood glucose of 597 on CMP. She documented and will make PCP aware.

## 2024-04-19 NOTE — Telephone Encounter (Signed)
 Call to patient, when I asked to confirm his birthday he confirmed but then asked mine and I declined the info. He stated well I am not answering any more questions, what is this about? I said I am calling to review lab results.  He said ok what is it? I relayed blood glucose level of 597 and gave Dr. Buck advise of seeking PCP or urgent care because of dangerously high level and he said  well I'll get there when I can. He verbalized understanding of recommendations.

## 2024-04-19 NOTE — Telephone Encounter (Signed)
 Please also notify PCP office with [facility] medicine of these results and our recommendations.

## 2024-04-19 NOTE — Telephone Encounter (Signed)
 Per Angie w/ billing, [facility] LVM on her phone informing staff of a critical lab. Reference @[id number]  for Amy NP. I checked the pt's chart. I see the Glucose on CMP was elevated at 597. Since Amy NP not here, will ask Dr Buck to review lab results and next steps.

## 2024-04-19 NOTE — Telephone Encounter (Signed)
 Please advise patient that his blood sugar level is highly elevated based on yesterday's numbers and even a week ago his blood sugar was highly elevated.  I recommend he make an appointment with his PCP for today.  If not possible to be seen today, I recommend that he go to urgent care for addressing and management of acutely high blood sugar levels.

## 2024-04-20 ENCOUNTER — Ambulatory Visit: Payer: Self-pay | Admitting: Family Medicine

## 2024-04-22 LAB — COMPREHENSIVE METABOLIC PANEL WITH GFR
ALT: 19 IU/L (ref 0–44)
AST: 17 IU/L (ref 0–40)
Albumin: 4.4 g/dL (ref 3.9–4.9)
Alkaline Phosphatase: 104 IU/L (ref 47–123)
BUN/Creatinine Ratio: 11 (ref 10–24)
BUN: 15 mg/dL (ref 8–27)
Bilirubin Total: 0.5 mg/dL (ref 0.0–1.2)
CO2: 23 mmol/L (ref 20–29)
Calcium: 9.7 mg/dL (ref 8.6–10.2)
Chloride: 91 mmol/L — ABNORMAL LOW (ref 96–106)
Creatinine, Ser: 1.35 mg/dL — ABNORMAL HIGH (ref 0.76–1.27)
Globulin, Total: 2.6 g/dL (ref 1.5–4.5)
Glucose: 597 mg/dL (ref 70–99)
Potassium: 4.7 mmol/L (ref 3.5–5.2)
Sodium: 130 mmol/L — ABNORMAL LOW (ref 134–144)
Total Protein: 7 g/dL (ref 6.0–8.5)
eGFR: 56 mL/min/1.73 — ABNORMAL LOW (ref >59)

## 2024-04-22 LAB — CBC WITH DIFFERENTIAL/PLATELET
Basophils Absolute: 0.1 x10E3/uL (ref 0.0–0.2)
Basos: 2 %
EOS (ABSOLUTE): 0.1 x10E3/uL (ref 0.0–0.4)
Eos: 2 %
Hematocrit: 45.8 % (ref 37.5–51.0)
Hemoglobin: 14.5 g/dL (ref 13.0–17.7)
Immature Grans (Abs): 0.1 x10E3/uL (ref 0.0–0.1)
Immature Granulocytes: 2 %
Lymphocytes Absolute: 0.9 x10E3/uL (ref 0.7–3.1)
Lymphs: 23 %
MCH: 28.8 pg (ref 26.6–33.0)
MCHC: 31.7 g/dL (ref 31.5–35.7)
MCV: 91 fL (ref 79–97)
Monocytes Absolute: 0.3 x10E3/uL (ref 0.1–0.9)
Monocytes: 7 %
Neutrophils Absolute: 2.7 x10E3/uL (ref 1.4–7.0)
Neutrophils: 64 %
Platelets: 269 x10E3/uL (ref 150–450)
RBC: 5.04 x10E6/uL (ref 4.14–5.80)
RDW: 12.7 % (ref 11.6–15.4)
WBC: 4.1 x10E3/uL (ref 3.4–10.8)

## 2024-04-22 LAB — ATN PROFILE
A -- Beta-amyloid 42/40 Ratio: 0.094 — AB (ref >0.102)
Beta-amyloid 40: 200.99 pg/mL
Beta-amyloid 42: 18.97 pg/mL
N -- NfL, Plasma: 3.22 pg/mL (ref 0.00–6.04)
T -- p-tau181: 1.3 pg/mL — AB (ref 0.00–0.97)

## 2024-04-25 ENCOUNTER — Encounter: Payer: Self-pay | Admitting: Family Medicine

## 2024-05-11 ENCOUNTER — Other Ambulatory Visit: Payer: Self-pay | Admitting: Infectious Disease

## 2024-05-11 ENCOUNTER — Encounter: Payer: Self-pay | Admitting: Infectious Disease

## 2024-05-11 DIAGNOSIS — E1165 Type 2 diabetes mellitus with hyperglycemia: Secondary | ICD-10-CM | POA: Insufficient documentation

## 2024-05-11 NOTE — Progress Notes (Deleted)
 Chief Complaint: follow-up for osteomyelitis in 2nd to sp amputaiton and concern re the 3rd toe  Subjective:    Patient ID: Michael Hartman, male    DOB: 1955/10/09, 68 y.o.   MRN: [id number]  HPI  Past Medical History:  Diagnosis Date   Allergies    Arthritis    Atrial fibrillation    Atrial flutter    a. diagnosed in 11/2016 --> started on Eliquis  for anticoagulation b. s/p DCCV in 01/2017   CAD (coronary artery disease) 08/02/2007   Celiac disease    Chronic insomnia    Chronic rhinitis 08/02/2007   Coronary artery disease    a. s/p remote stenting of RCA in 1998   Diabetes mellitus type 2 03/23/2012   Erectile dysfunction    Essential hypertension 08/02/2007   Gastro-esophageal reflux disease with esophagitis 09/19/2020   Group B streptococcal infection 03/14/2024   History of migraine headaches    MCI (mild cognitive impairment) 09/19/2020   OSA (obstructive sleep apnea)    uses CPAP regularly   Osteomyelitis of left foot (HCC) 03/14/2024   Penicillin allergy 03/14/2024   Poorly controlled diabetes mellitus (HCC) 05/11/2024   Pure hypercholesterolemia 08/02/2007   Secondary hypercoagulable state 07/26/2020   Sinusitis    Stage 2 chronic kidney disease     Past Surgical History:  Procedure Laterality Date   AMPUTATION TOE Left 02/09/2024   Procedure: AMPUTATION, TOE;  Surgeon: Barton Drape, MD;  Location: [facility];  Service: Orthopedics;  Laterality: Left;  2ND TOE AMPUTATION   ANKLE ARTHROPLASTY     ATRIAL FIBRILLATION ABLATION N/A 10/24/2020   Procedure: ATRIAL FIBRILLATION ABLATION;  Surgeon: Inocencio Soyla Lunger, MD;  Location: [facility] INVASIVE CV LAB;  Service: Cardiovascular;  Laterality: N/A;   CARDIAC CATHETERIZATION  06/06/1997   EF 65%   CARDIOVASCULAR STRESS TEST  12/07/2007   EF 58%   CARDIOVERSION N/A 01/08/2017   Procedure: CARDIOVERSION;  Surgeon: Delford Maude BROCKS, MD;  Location: [facility] ENDOSCOPY;  Service: Cardiovascular;  Laterality: N/A;   CARDIOVERSION  N/A 08/01/2020   Procedure: CARDIOVERSION;  Surgeon: Loni Soyla LABOR, MD;  Location: [facility] ENDOSCOPY;  Service: Cardiovascular;  Laterality: N/A;   CORONARY STENT PLACEMENT     ELBOW ARTHROPLASTY     EYE SURGERY     KNEE ARTHROPLASTY     low back surgery     NASAL SINUS SURGERY     REFRACTIVE SURGERY     SHOULDER ACROMIOPLASTY     SPINE SURGERY      Family History  Problem Relation Age of Onset   Hypertension Mother    Diabetes Mother    Alzheimer's disease Mother    Gallstones Mother    Ulcers Mother    Arthritis Mother    Stroke Father    Hypertension Father    Heart attack Father    Hypertension Brother       Social History   Socioeconomic History   Marital status: Married    Spouse name: Shawnee   Number of children: 0   Years of education: 16   Highest education level: Bachelor's degree (e.g., BA, AB, BS)  Occupational History   Occupation: Retired    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   Retired    Chief Executive Officer Drivers of Corporate Investment Banker Strain: Not on [employer] Insecurity: No Food Insecurity (02/07/2024)   Hunger Vital Sign    Worried About Running Out of Food in the Last Year: Never true    Ran Out of Food in the Last Year: Never true  Transportation Needs: No Transportation Needs (02/07/2024)   PRAPARE - Administrator, Civil Service (Medical): No    Lack of Transportation (Non-Medical): No   Physical Activity: Not on file  Stress: Not on file  Social Connections: Not on file    Allergies  Allergen Reactions   Ambien [Zolpidem Tartrate]     Amnesia, odd behavior   Amoxicillin Rash   Penicillins Rash    Child hood     Current Outpatient Medications:    acetaminophen  (TYLENOL ) 500 MG tablet, Take 1,000 mg by mouth every 6 (six) hours as needed for mild pain or headache., Disp: , Rfl:    atorvastatin  (LIPITOR) 80 MG tablet, Take 1 tablet (80 mg total) by mouth daily., Disp: 90 tablet, Rfl: 3   cefadroxil  (DURICEF) 500 MG capsule, TAKE 2 CAPSULES BY MOUTH TWICE A DAY, Disp: 120 capsule, Rfl: 1   diltiazem  (CARDIZEM  CD) 180 MG 24 hr capsule, Take 1 capsule (180 mg total) by mouth daily., Disp: 90 capsule, Rfl: 3   donepezil  (ARICEPT ) 10 MG tablet, TAKE 1 TABLET (10 MG TOTAL) BY  MOUTH AT BEDTIME. FOLLOW  INSTRUCTIONS PROVIDED FOR THE  FIRST MONTH OF TREATMENT., Disp: 90 tablet, Rfl: 3   ELIQUIS  5 MG TABS tablet, TAKE 1 TABLET BY MOUTH TWICE  DAILY, Disp: 180 tablet, Rfl: 3   escitalopram (LEXAPRO) 10 MG tablet, Take 10 mg by mouth daily., Disp: , Rfl:    glipiZIDE (GLUCOTROL XL) 10 MG 24 hr tablet, Take 10 mg by mouth daily., Disp: , Rfl:    JARDIANCE 25 MG TABS tablet, Take 25 mg by mouth daily., Disp: , Rfl:    metFORMIN  (GLUCOPHAGE ) 1000 MG tablet, Take 1,000 mg by mouth 2 (two) times daily., Disp: , Rfl:    methocarbamol (ROBAXIN) 500 MG tablet, Take 500 mg by mouth 2 (two) times daily as needed for muscle spasms., Disp: , Rfl:    metoprolol  succinate (TOPROL -XL) 100 MG 24 hr tablet, TAKE 1 TABLET BY MOUTH DAILY, Disp: 90 tablet, Rfl: 3   mirtazapine  (REMERON ) 45 MG tablet, Take 45 mg by mouth at bedtime., Disp: , Rfl:    nitroGLYCERIN  (NITROSTAT ) 0.4 MG SL tablet, Place 1 tablet (0.4 mg total) under the tongue every 5 (five) minutes as needed for chest pain., Disp: 25 tablet, Rfl: 11   pioglitazone (ACTOS) 30 MG tablet, Take 30 mg by mouth daily., Disp: , Rfl:    Review  of Systems     Objective:   Physical Exam        Assessment & Plan:

## 2024-05-12 ENCOUNTER — Ambulatory Visit: Payer: Self-pay | Admitting: Infectious Disease

## 2024-05-13 ENCOUNTER — Other Ambulatory Visit: Payer: Self-pay

## 2024-05-13 ENCOUNTER — Encounter: Payer: Self-pay | Admitting: Infectious Disease

## 2024-05-13 ENCOUNTER — Ambulatory Visit (INDEPENDENT_AMBULATORY_CARE_PROVIDER_SITE_OTHER): Admitting: Infectious Disease

## 2024-05-13 ENCOUNTER — Ambulatory Visit: Admitting: Infectious Disease

## 2024-05-13 VITALS — BP 155/88 | HR 68 | Temp 97.3°F | Ht 74.0 in | Wt 250.0 lb

## 2024-05-13 DIAGNOSIS — A491 Streptococcal infection, unspecified site: Secondary | ICD-10-CM | POA: Diagnosis not present

## 2024-05-13 DIAGNOSIS — E1165 Type 2 diabetes mellitus with hyperglycemia: Secondary | ICD-10-CM

## 2024-05-13 DIAGNOSIS — E1141 Type 2 diabetes mellitus with diabetic mononeuropathy: Secondary | ICD-10-CM

## 2024-05-13 DIAGNOSIS — M86272 Subacute osteomyelitis, left ankle and foot: Secondary | ICD-10-CM | POA: Diagnosis not present

## 2024-05-13 DIAGNOSIS — E11628 Type 2 diabetes mellitus with other skin complications: Secondary | ICD-10-CM

## 2024-05-13 DIAGNOSIS — E1149 Type 2 diabetes mellitus with other diabetic neurological complication: Secondary | ICD-10-CM | POA: Diagnosis not present

## 2024-05-13 NOTE — Progress Notes (Signed)
 Subjective:  Chief complaint: follow-up for osteomyelitis   Patient ID: Michael Hartman, male    DOB: 1956-04-14, 68 y.o.   MRN: [id number]  HPI  Michael Hartman is a 68 year old male who presents withhiastory of recent discoloration and swelling of the third toe.  He had persistent discoloration and swelling in his third toe WHEN I  LAST SAW HIM NOT TODAY,  following a foot injury and subsequent second toe amputation in early September 2025. At that time the discoloration has not worsened but remains, with the nails appearing 'dirty' and 'kind of like blood'. He describes the appearance as 'nasty' and is unable to bend the toe, which is also swollen.  Initially treated with doxycycline , he was switched to cefadroxil , two tablets twice daily for four weeks, after an October 2025 visit. Despite this treatment, concerns about the foot's condition persisted.SABRA He confirms recent use of cefadroxil .  Plain films of the foot done in November showed interval second digit phalangeal amputation with vague cortical erosion of the third digit on the phalangeal tuft of the lateral view, without definitive evidence of osteomyelitis. An old healed fracture at the third digit phalangeal base was also noted. Labs from November showed a normal sed rate of 14 and a C-reactive protein of 4.1.  No significant pain in the third toe, but he acknowledges some numbness in his feet. He does not recall significant pain prior to the second toe amputation, only 'a little bit maybe'.  Since I last saw him the erythema has completely disappeared from the toe  He has an upcoming appointment with his orthopedic surgeon on December 23rd and another appointment on December 30th to assess blood flow in the foot.   Past Medical History:  Diagnosis Date   Allergies    Arthritis    Atrial fibrillation    Atrial flutter    a. diagnosed in 11/2016 --> started on Eliquis  for anticoagulation b. s/p DCCV in 01/2017   CAD (coronary  artery disease) 08/02/2007   Celiac disease    Chronic insomnia    Chronic rhinitis 08/02/2007   Coronary artery disease    a. s/p remote stenting of RCA in 1998   Diabetes mellitus type 2 03/23/2012   Erectile dysfunction    Essential hypertension 08/02/2007   Gastro-esophageal reflux disease with esophagitis 09/19/2020   Group B streptococcal infection 03/14/2024   History of migraine headaches    MCI (mild cognitive impairment) 09/19/2020   OSA (obstructive sleep apnea)    uses CPAP regularly   Osteomyelitis of left foot (HCC) 03/14/2024   Penicillin allergy 03/14/2024   Poorly controlled diabetes mellitus (HCC) 05/11/2024   Pure hypercholesterolemia 08/02/2007   Secondary hypercoagulable state 07/26/2020   Sinusitis    Stage 2 chronic kidney disease     Past Surgical History:  Procedure Laterality Date   AMPUTATION TOE Left 02/09/2024   Procedure: AMPUTATION, TOE;  Surgeon: Barton Drape, MD;  [facility];  Service: Orthopedics;  Laterality: Left;  2ND TOE AMPUTATION   ANKLE ARTHROPLASTY     ATRIAL FIBRILLATION ABLATION N/A 10/24/2020   Procedure: ATRIAL FIBRILLATION ABLATION;  Surgeon: Inocencio Soyla Lunger, MD;  [facility] INVASIVE [facility] LAB;  Service: Cardiovascular;  Laterality: N/A;   CARDIAC CATHETERIZATION  06/06/1997   EF 65%   CARDIOVASCULAR STRESS TEST  12/07/2007   EF 58%   CARDIOVERSION N/A 01/08/2017   Procedure: CARDIOVERSION;  Surgeon: Delford Maude BROCKS, MD;  Location: [facility] ENDOSCOPY;  Service: Cardiovascular;  Laterality: N/A;   CARDIOVERSION N/A 08/01/2020   Procedure: CARDIOVERSION;  Surgeon: Loni Soyla LABOR, MD;  Location: [facility] ENDOSCOPY;  Service: Cardiovascular;  Laterality: N/A;   CORONARY STENT PLACEMENT     ELBOW ARTHROPLASTY     EYE SURGERY     KNEE ARTHROPLASTY     low back surgery     NASAL SINUS SURGERY     REFRACTIVE SURGERY     SHOULDER ACROMIOPLASTY     SPINE SURGERY      Family History  Problem Relation Age of Onset    Hypertension Mother    Diabetes Mother    Alzheimer's disease Mother    Gallstones Mother    Ulcers Mother    Arthritis Mother    Stroke Father    Hypertension Father    Heart attack Father    Hypertension Brother       Social History   Socioeconomic History   Marital status: Married    Spouse name: Shawnee   Number of children: 0   Years of education: 16   Highest education level: Bachelor's degree (e.g., BA, AB, BS)  Occupational History   Occupation: Retired    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   Retired    Chief Executive Officer Drivers of Corporate Investment Banker Strain: Not on [employer] Insecurity: No Food Insecurity (02/07/2024)   Hunger Vital Sign    Worried About Running Out of Food in the Last Year: Never true    Ran Out of Food in the Last Year: Never true  Transportation Needs: No Transportation Needs (02/07/2024)   PRAPARE - Administrator, Civil Service (Medical): No    Lack of Transportation (Non-Medical): No  Physical Activity: Not on file  Stress: Not on file  Social Connections: Not on file    Allergies  Allergen Reactions   Ambien [Zolpidem Tartrate]     Amnesia, odd behavior   Amoxicillin Rash   Penicillins Rash    Child hood     Current Outpatient Medications:    acetaminophen  (TYLENOL ) 500 MG tablet, Take 1,000 mg by mouth every 6 (six) hours as needed for mild pain or headache., Disp: , Rfl:     atorvastatin  (LIPITOR) 80 MG tablet, Take 1 tablet (80 mg total) by mouth daily., Disp: 90 tablet, Rfl: 3   cefadroxil  (DURICEF) 500 MG capsule, TAKE 2 CAPSULES BY MOUTH TWICE A DAY, Disp: 120 capsule, Rfl: 1   diltiazem  (CARDIZEM  CD) 180 MG 24 hr capsule, Take 1 capsule (180 mg total) by mouth daily., Disp: 90 capsule, Rfl: 3   donepezil  (ARICEPT ) 10 MG tablet, TAKE 1 TABLET (10 MG TOTAL) BY  MOUTH AT BEDTIME. FOLLOW  INSTRUCTIONS PROVIDED FOR THE  FIRST MONTH OF TREATMENT., Disp: 90 tablet, Rfl: 3   ELIQUIS  5 MG TABS tablet,  TAKE 1 TABLET BY MOUTH TWICE  DAILY, Disp: 180 tablet, Rfl: 3   escitalopram (LEXAPRO) 10 MG tablet, Take 10 mg by mouth daily., Disp: , Rfl:    glipiZIDE (GLUCOTROL XL) 10 MG 24 hr tablet, Take 10 mg by mouth daily., Disp: , Rfl:    JARDIANCE 25 MG TABS tablet, Take 25 mg by mouth daily., Disp: , Rfl:    metFORMIN  (GLUCOPHAGE ) 1000 MG tablet, Take 1,000 mg by mouth 2 (two) times daily., Disp: , Rfl:    methocarbamol (ROBAXIN) 500 MG tablet, Take 500 mg by mouth 2 (two) times daily as needed for muscle spasms., Disp: , Rfl:    metoprolol  succinate (TOPROL -XL) 100 MG 24 hr tablet, TAKE 1 TABLET BY MOUTH DAILY, Disp: 90 tablet, Rfl: 3   mirtazapine  (REMERON ) 45 MG tablet, Take 45 mg by mouth at bedtime., Disp: , Rfl:    nitroGLYCERIN  (NITROSTAT ) 0.4 MG SL tablet, Place 1 tablet (0.4 mg total) under the tongue every 5 (five) minutes as needed for chest pain., Disp: 25 tablet, Rfl: 11   pioglitazone (ACTOS) 30 MG tablet, Take 30 mg by mouth daily., Disp: , Rfl:    Review of Systems  Constitutional:  Negative for activity change, appetite change, chills, diaphoresis, fatigue, fever and unexpected weight change.  HENT:  Negative for congestion, rhinorrhea, sinus pressure, sneezing, sore throat and trouble swallowing.   Eyes:  Negative for photophobia and visual disturbance.  Respiratory:  Negative for cough, chest tightness, shortness of breath, wheezing and stridor.    Cardiovascular:  Negative for chest pain, palpitations and leg swelling.  Gastrointestinal:  Negative for abdominal distention, abdominal pain, anal bleeding, blood in stool, constipation, diarrhea, nausea and vomiting.  Genitourinary:  Negative for difficulty urinating, dysuria, flank pain and hematuria.  Musculoskeletal:  Negative for arthralgias, back pain, gait problem, joint swelling and myalgias.  Skin:  Negative for color change, pallor, rash and wound.  Neurological:  Negative for dizziness, tremors, weakness and light-headedness.  Hematological:  Negative for adenopathy. Does not bruise/bleed easily.  Psychiatric/Behavioral:  Negative for agitation, behavioral problems, confusion, decreased concentration, dysphoric mood and sleep disturbance.        Objective:   Physical Exam Constitutional:      Appearance: He is well-developed.  HENT:     Head: Normocephalic and atraumatic.  Eyes:     Conjunctiva/sclera: Conjunctivae normal.  Cardiovascular:     Rate and Rhythm: Normal rate and regular rhythm.  Pulmonary:     Effort: Pulmonary effort is normal. No respiratory distress.     Breath sounds: No wheezing.  Abdominal:     General: There is no distension.     Palpations: Abdomen is soft.  Musculoskeletal:        General: No tenderness. Normal range of motion.     Cervical back: Normal range of motion and neck supple.  Skin:    General: Skin is warm and dry.     Coloration: Skin is not pale.     Findings: No erythema or rash.  Neurological:     General: No focal deficit present.     Mental Status: He is alert and oriented to person, place, and time.  Psychiatric:        Mood and Affect: Mood normal.        Behavior: Behavior normal.        Thought Content: Thought content normal.        Judgment: Judgment normal.     Foot serial pictures  03/14/2024:           04/12/2024          05/13/2024:        Assessment & Plan:   Chronic right  third toe changes and swelling post-injury, monitoring for possible infection Persistent discoloration and swelling post-injury with normal inflammatory markers. X-ray showed vague cortical erosion without definitive osteomyelitis. Differential includes possible bone infection, but evidence is inconclusive. Antibiotics discontinued to assess infection status. - Discontinued antibiotics, monitor toe changes. - Ordered repeat labs for inflammatory markers, ESR, CRP  - Instructed to keep antibiotics for potential future use if symptoms return - Scheduled follow-up in January. - Advised to contact orthopedic surgeon if symptoms worsen or further intervention needed. - Consider MRI if symptoms worsen to define osteomyeiltis  Peripheral neuropathy due to DM  Numbness in feet may affect pain perception in affected toe. - Monitor for changes in sensation or pain in feet.

## 2024-05-14 LAB — SEDIMENTATION RATE: Sed Rate: 6 mm/h (ref 0–20)

## 2024-05-14 LAB — C-REACTIVE PROTEIN: CRP: <3 mg/L (ref <8.0)

## 2024-05-22 ENCOUNTER — Other Ambulatory Visit: Payer: Self-pay | Admitting: Adult Health

## 2024-05-22 DIAGNOSIS — I480 Paroxysmal atrial fibrillation: Secondary | ICD-10-CM

## 2024-06-06 ENCOUNTER — Encounter: Payer: Self-pay | Admitting: Family Medicine

## 2024-06-06 ENCOUNTER — Ambulatory Visit: Admitting: Infectious Disease

## 2024-06-06 DIAGNOSIS — F0283 Dementia in other diseases classified elsewhere, unspecified severity, with mood disturbance: Secondary | ICD-10-CM

## 2024-06-06 DIAGNOSIS — R451 Restlessness and agitation: Secondary | ICD-10-CM

## 2024-06-06 DIAGNOSIS — R4189 Other symptoms and signs involving cognitive functions and awareness: Secondary | ICD-10-CM

## 2024-06-07 ENCOUNTER — Encounter: Payer: Self-pay | Admitting: Vascular Surgery

## 2024-06-07 ENCOUNTER — Ambulatory Visit (HOSPITAL_COMMUNITY)
Admission: RE | Admit: 2024-06-07 | Discharge: 2024-06-07 | Disposition: A | Discharge status: Home / Self Care | Source: Ambulatory Visit | Attending: Vascular Surgery | Admitting: Vascular Surgery

## 2024-06-07 ENCOUNTER — Ambulatory Visit (INDEPENDENT_AMBULATORY_CARE_PROVIDER_SITE_OTHER): Admitting: Vascular Surgery

## 2024-06-07 VITALS — BP 155/78 | HR 65 | Ht 74.0 in | Wt 241.0 lb

## 2024-06-07 DIAGNOSIS — L97509 Non-pressure chronic ulcer of other part of unspecified foot with unspecified severity: Secondary | ICD-10-CM

## 2024-06-07 NOTE — Progress Notes (Signed)
 [facility] [place] PROGRESS NOTE  ASSESSMENT / PLAN: Michael Hartman is a 68 y.o. male status post toe amputation for DFU. Patient has healed completely. Non-invasive testing reassuring. Follow up as needed.  SUBJECTIVE: Patient underwent left second toe amputation. HE is doing well and has completely healed. No sx typical of PAD.  OBJECTIVE: BP (!) 155/78 (BP Location: Right Arm, Patient Position: Sitting, Cuff Size: Large)   Pulse 65   Ht 6' 2 (1.88 m)   Wt 241 lb (109.3 kg)   SpO2 95%   BMI 30.94 kg/m   No distress 2+ L DP/PT L second toe surgically absent. Wound completely healed.     Latest Ref Rng & Units 04/18/2024    2:22 PM 04/12/2024    3:10 PM 03/14/2024    1:12 PM  CBC  WBC 3.4 - 10.8 x10E3/uL 4.1  4.1  4.4   Hemoglobin 13.0 - 17.7 g/dL 85.4  84.5  85.0   Hematocrit 37.5 - 51.0 % 45.8  48.1  44.5   Platelets 150 - 450 x10E3/uL 269  312  281         Latest Ref Rng & Units 04/18/2024    2:22 PM 04/12/2024    3:10 PM 03/14/2024    1:12 PM  CMP  Glucose 70 - 99 mg/dL 402  529  658   BUN 8 - 27 mg/dL 15  19  11    Creatinine 0.76 - 1.27 mg/dL 8.64  8.58  8.89   Sodium 134 - 144 mmol/L 130  135  134   Potassium 3.5 - 5.2 mmol/L 4.7  4.2  4.5   Chloride 96 - 106 mmol/L 91  98  98   CO2 20 - 29 mmol/L 23  24  24    Calcium  8.6 - 10.2 mg/dL 9.7  9.7  89.9   Total Protein 6.0 - 8.5 g/dL 7.0     Total Bilirubin 0.0 - 1.2 mg/dL 0.5     Alkaline Phos 47 - 123 IU/L 104     AST 0 - 40 IU/L 17     ALT 0 - 44 IU/L 19      Maliq Pilley N. Magda, MD [facility] Phone Number: [phone] 06/07/2024 7:49 PM

## 2024-06-08 NOTE — Telephone Encounter (Signed)
 See MyChart response.

## 2024-06-14 ENCOUNTER — Telehealth: Payer: Self-pay | Admitting: Family Medicine

## 2024-06-14 NOTE — Telephone Encounter (Signed)
 Referral for social work fax to [facility]. Phone: [phone], Fax: [phone]

## 2024-06-15 ENCOUNTER — Telehealth: Payer: Self-pay

## 2024-06-15 ENCOUNTER — Encounter: Payer: Self-pay | Admitting: Family Medicine

## 2024-06-15 DIAGNOSIS — R4189 Other symptoms and signs involving cognitive functions and awareness: Secondary | ICD-10-CM

## 2024-06-16 NOTE — Telephone Encounter (Signed)
 FYI

## 2024-06-16 NOTE — Telephone Encounter (Signed)
 Called [name] care they have received Pt  Referral . Rep stated she will send to referral coordinator to see if they can Expedite Pt Referral

## 2024-06-20 ENCOUNTER — Telehealth: Payer: Self-pay | Admitting: Neurology
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
## 2024-06-20 NOTE — Telephone Encounter (Addendum)
 Pt's wife sent this latest message back to our office. I messaged her back and asked her if she would go ahead and schedule a 6 month f/u and if she changes her mind about the referral to geriatric psychiatry to let us  know:

## 2024-06-21 ENCOUNTER — Telehealth: Payer: Self-pay | Admitting: *Deleted

## 2024-06-21 NOTE — Progress Notes (Signed)
 Complex Care Management Note  Care Guide Note 06/21/2024 Name: Michael Hartman MRN: [id number] DOB: 10-06-1955  Michael Hartman is a 69 y.o. year old male who sees [name] PARAS, MD for primary care. I reached out to Marcey LELON Molly by phone today to offer complex care management services.  Mr. Kaucher was given information about Complex Care Management services today including:   The Complex Care Management services include support from the care team which includes your Nurse Care Manager, Clinical Social Worker, or Pharmacist.  The Complex Care Management team is here to help remove barriers to the health concerns and goals most important to you. Complex Care Management services are voluntary, and the patient may decline or stop services at any time by request to their care team member.   Complex Care Management Consent Status: Patient agreed to services and verbal consent obtained.   Follow up plan:  Telephone appointment with complex care management team member scheduled for:  1/22 at 200 PM  Encounter Outcome:  Patient Scheduled  Harlene Satterfield  [facility]  [facility], [facility] Guide  Direct Dial: [phone]  Fax [phone]

## 2024-06-22 ENCOUNTER — Other Ambulatory Visit: Payer: Self-pay | Admitting: Adult Health

## 2024-06-22 DIAGNOSIS — I480 Paroxysmal atrial fibrillation: Secondary | ICD-10-CM

## 2024-06-22 NOTE — Progress Notes (Signed)
 Michael Hartman                                          MRN: [id number]   06/22/2024   The [facility] Quality Team Specialist reviewed this patient medical record for the purposes of chart review for care gap closure. The following were reviewed: chart review for care gap closure-glycemic status assessment.    VBCI Quality Team

## 2024-06-22 NOTE — Telephone Encounter (Signed)
 CMP completed on 04/18/24

## 2024-06-27 ENCOUNTER — Ambulatory Visit: Payer: Self-pay | Admitting: Infectious Disease

## 2024-06-30 ENCOUNTER — Other Ambulatory Visit: Payer: Self-pay

## 2024-06-30 NOTE — Patient Outreach (Signed)
 Complex Care Management   Visit Note  06/30/2024  Name:  Michael Hartman MRN: [id number] DOB: 07-08-1955  Situation: Referral received for Complex Care Management related to Caregiver Stress. I obtained verbal consent from Caregiver.  Visit completed with Caregiver  on the phone  Background:   Past Medical History:  Diagnosis Date   Allergies    Arthritis    Atrial fibrillation    Atrial flutter    a. diagnosed in 11/2016 --> started on Eliquis  for anticoagulation b. s/p DCCV in 01/2017   CAD (coronary artery disease) 08/02/2007   Celiac disease    Chronic insomnia    Chronic rhinitis 08/02/2007   Coronary artery disease    a. s/p remote stenting of RCA in 1998   Diabetes mellitus type 2 03/23/2012   Erectile dysfunction    Essential hypertension 08/02/2007   Gastro-esophageal reflux disease with esophagitis 09/19/2020   Group B streptococcal infection 03/14/2024   History of migraine headaches    MCI (mild cognitive impairment) 09/19/2020   OSA (obstructive sleep apnea)    uses CPAP regularly   Osteomyelitis of left foot (HCC) 03/14/2024   Penicillin allergy 03/14/2024   Poorly controlled diabetes mellitus (HCC) 05/11/2024   Pure hypercholesterolemia 08/02/2007   Secondary hypercoagulable state 07/26/2020   Sinusitis    Stage 2 chronic kidney disease     Assessment:Call to Caregiver to complete initial assessment and evaluate for needs. Spouse requested information on Dementia care and support services for caregivers, which LCSW will provide via email per her request. She prefers not to have LCSW follow up via phone, but instead will contact LCSW herself should issues or needs arise.  Patient Reported Symptoms:  Cognitive Cognitive Status: Other: (Has dementia but is able to attend to his own ADLs)      Neurological Neurological Review of Symptoms: No symptoms reported    HEENT HEENT Symptoms Reported: No symptoms reported      Cardiovascular Cardiovascular Symptoms  Reported: No symptoms reported Does patient have uncontrolled Hypertension?: No    Respiratory Respiratory Symptoms Reported: No symptoms reported    Endocrine Endocrine Symptoms Reported: Hyperglycemia Is patient diabetic?: Yes    Gastrointestinal Gastrointestinal Symptoms Reported: No symptoms reported      Genitourinary Genitourinary Symptoms Reported: No symptoms reported    Integumentary Integumentary Symptoms Reported: No symptoms reported Additional Integumentary Details: bruise easily d/t blood thinner    Musculoskeletal Musculoskelatal Symptoms Reviewed: Back pain Additional Musculoskeletal Details: chronic back and hip pain        Psychosocial Additional Psychological Details: per spouse Patient seems depressed and angry at times     Quality of Family Relationships: stressful Do you feel physically threatened by others?: No    06/30/2024    PHQ2-9 Depression Screening   Little interest or pleasure in doing things    Feeling down, depressed, or hopeless    PHQ-2 - Total Score    Trouble falling or staying asleep, or sleeping too much    Feeling tired or having little energy    Poor appetite or overeating     Feeling bad about yourself - or that you are a failure or have let yourself or your family down    Trouble concentrating on things, such as reading the newspaper or watching television    Moving or speaking so slowly that other people could have noticed.  Or the opposite - being so fidgety or restless that you have been moving around a lot more than usual  Thoughts that you would be better off dead, or hurting yourself in some way    PHQ2-9 Total Score    If you checked off any problems, how difficult have these problems made it for you to do your work, take care of things at home, or get along with other people    Depression Interventions/Treatment      There were no vitals filed for this visit. Pain Scale: 0-10 (has back and hip pain chronic) Pain  Location: Back  Medications Reviewed Today     Reviewed by Angelena Finis HERO, LCSW (Social Worker) on 06/30/24 at 1451  Med List Status: <None>   Medication Order Taking? Sig Documenting Provider Last Dose Status Informant  acetaminophen  (TYLENOL ) 500 MG tablet [id number]  Take 1,000 mg by mouth every 6 (six) hours as needed for mild pain or headache. [provider]  Active Spouse/Significant Other, Pharmacy Records  atorvastatin  (LIPITOR) 80 MG tablet [id number]  Take 1 tablet (80 mg total) by mouth daily. Jordan, Peter M, MD  Active Spouse/Significant Other, Pharmacy Records  cefadroxil  (DURICEF) 500 MG capsule [id number]  TAKE 2 CAPSULES BY MOUTH TWICE A DAY Fleeta Rothman, Jomarie SAILOR, MD  Active   diltiazem  (CARTIA  XT) 180 MG 24 hr capsule [id number]  TAKE 1 CAPSULE BY MOUTH DAILY Jordan, Peter M, MD  Active   donepezil  (ARICEPT ) 10 MG tablet [id number] Yes TAKE 1 TABLET (10 MG TOTAL) BY  MOUTH AT BEDTIME. FOLLOW  INSTRUCTIONS PROVIDED FOR THE  FIRST MONTH OF TREATMENT. Lomax, Amy, NP  Active   ELIQUIS  5 MG TABS tablet [id number]  TAKE 1 TABLET BY MOUTH TWICE  DAILY Jerilynn Lamarr HERO, NP  Active Spouse/Significant Other, Pharmacy Records  escitalopram (LEXAPRO) 10 MG tablet [id number]  Take 10 mg by mouth daily. [provider]  Active Spouse/Significant Other, Pharmacy Records  glipiZIDE (GLUCOTROL XL) 10 MG 24 hr tablet [id number]  Take 10 mg by mouth daily. [provider]  Active Spouse/Significant Other, Pharmacy Records  JARDIANCE 25 MG TABS tablet [id number]  Take 25 mg by mouth daily. [provider]  Active Spouse/Significant Other, Pharmacy Records  metFORMIN  (GLUCOPHAGE ) 1000 MG tablet [id number]  Take 1,000 mg by mouth 2 (two) times daily. [provider]  Active Spouse/Significant Other, Pharmacy Records  methocarbamol (ROBAXIN) 500 MG tablet [id number]  Take 500 mg by mouth 2 (two) times daily as needed for muscle spasms. [provider]   Active Spouse/Significant Other, Pharmacy Records  metoprolol  succinate (TOPROL -XL) 100 MG 24 hr tablet [id number]  TAKE 1 TABLET BY MOUTH DAILY Camnitz, Will Gladis, MD  Active Spouse/Significant Other, Pharmacy Records  mirtazapine  (REMERON ) 45 MG tablet [id number]  Take 45 mg by mouth at bedtime. [provider]  Active Spouse/Significant Other, Pharmacy Records  nitroGLYCERIN  (NITROSTAT ) 0.4 MG SL tablet [id number]  Place 1 tablet (0.4 mg total) under the tongue every 5 (five) minutes as needed for chest pain. Jordan, Peter M, MD  Active Spouse/Significant Other, Pharmacy Records           Med Note JACKOLYN WADDELL VEAR Pablo Feb 08, 2024 10:34 AM)    pioglitazone (ACTOS) 30 MG tablet [id number]  Take 30 mg by mouth daily. [provider]  Active Spouse/Significant Other, Pharmacy Records            Recommendation:   Review and follow up on Caregiver support resources   Follow Up Plan:   Closing From:  Complex Care Management  Murray Angelena, LCSW [facility], [facility] Licensed Clinical Social Worker Direct Dial : [phone]

## 2024-06-30 NOTE — Patient Instructions (Signed)
 Visit Information  Thank you for taking time to visit with me today. Please don't hesitate to contact me if I can be of assistance to you before our next scheduled appointment.  Your next care management appointment is no further scheduled appointments.    Please call the care guide team at [phone] if you need to cancel, schedule, or reschedule an appointment.   Please call [phone]-TALK (toll free, 24 hour hotline) call 911 if you are experiencing a Mental Health or Behavioral Health Crisis or need someone to talk to.  Murray Shawl, LCSW [facility], [facility] Licensed Clinical Social Worker Direct Dial : [phone]

## 2024-07-25 ENCOUNTER — Ambulatory Visit: Admitting: Infectious Disease
